# Patient Record
Sex: Male | Born: 1960 | Race: White | Hispanic: No | Marital: Single | Smoking: Never smoker
Health system: Southern US, Community
[De-identification: ages and names within clinical notes are randomized; demographics above are authoritative.]

## PROBLEM LIST (undated history)

## (undated) DIAGNOSIS — F1290 Cannabis use, unspecified, uncomplicated: Secondary | ICD-10-CM

## (undated) DIAGNOSIS — I10 Essential (primary) hypertension: Secondary | ICD-10-CM

## (undated) DIAGNOSIS — Z7289 Other problems related to lifestyle: Secondary | ICD-10-CM

## (undated) DIAGNOSIS — Z8719 Personal history of other diseases of the digestive system: Secondary | ICD-10-CM

## (undated) HISTORY — DX: Essential (primary) hypertension: I10

## (undated) HISTORY — PX: JOINT REPLACEMENT: SHX530

## (undated) HISTORY — PX: OTHER SURGICAL HISTORY: SHX169

---

## 2013-03-27 ENCOUNTER — Ambulatory Visit: Payer: Self-pay | Admitting: Internal Medicine

## 2014-09-28 IMAGING — CR DG SHOULDER 3+V*R*
1 series · 3 of 3 positions shown · non-contrast
Comparison: none

REASON FOR EXAM: prior fall
COMMENTS:

[Series 1: internal rotate · 0.17mm/px · 3 of 3 slices shown]
[im 1/3]
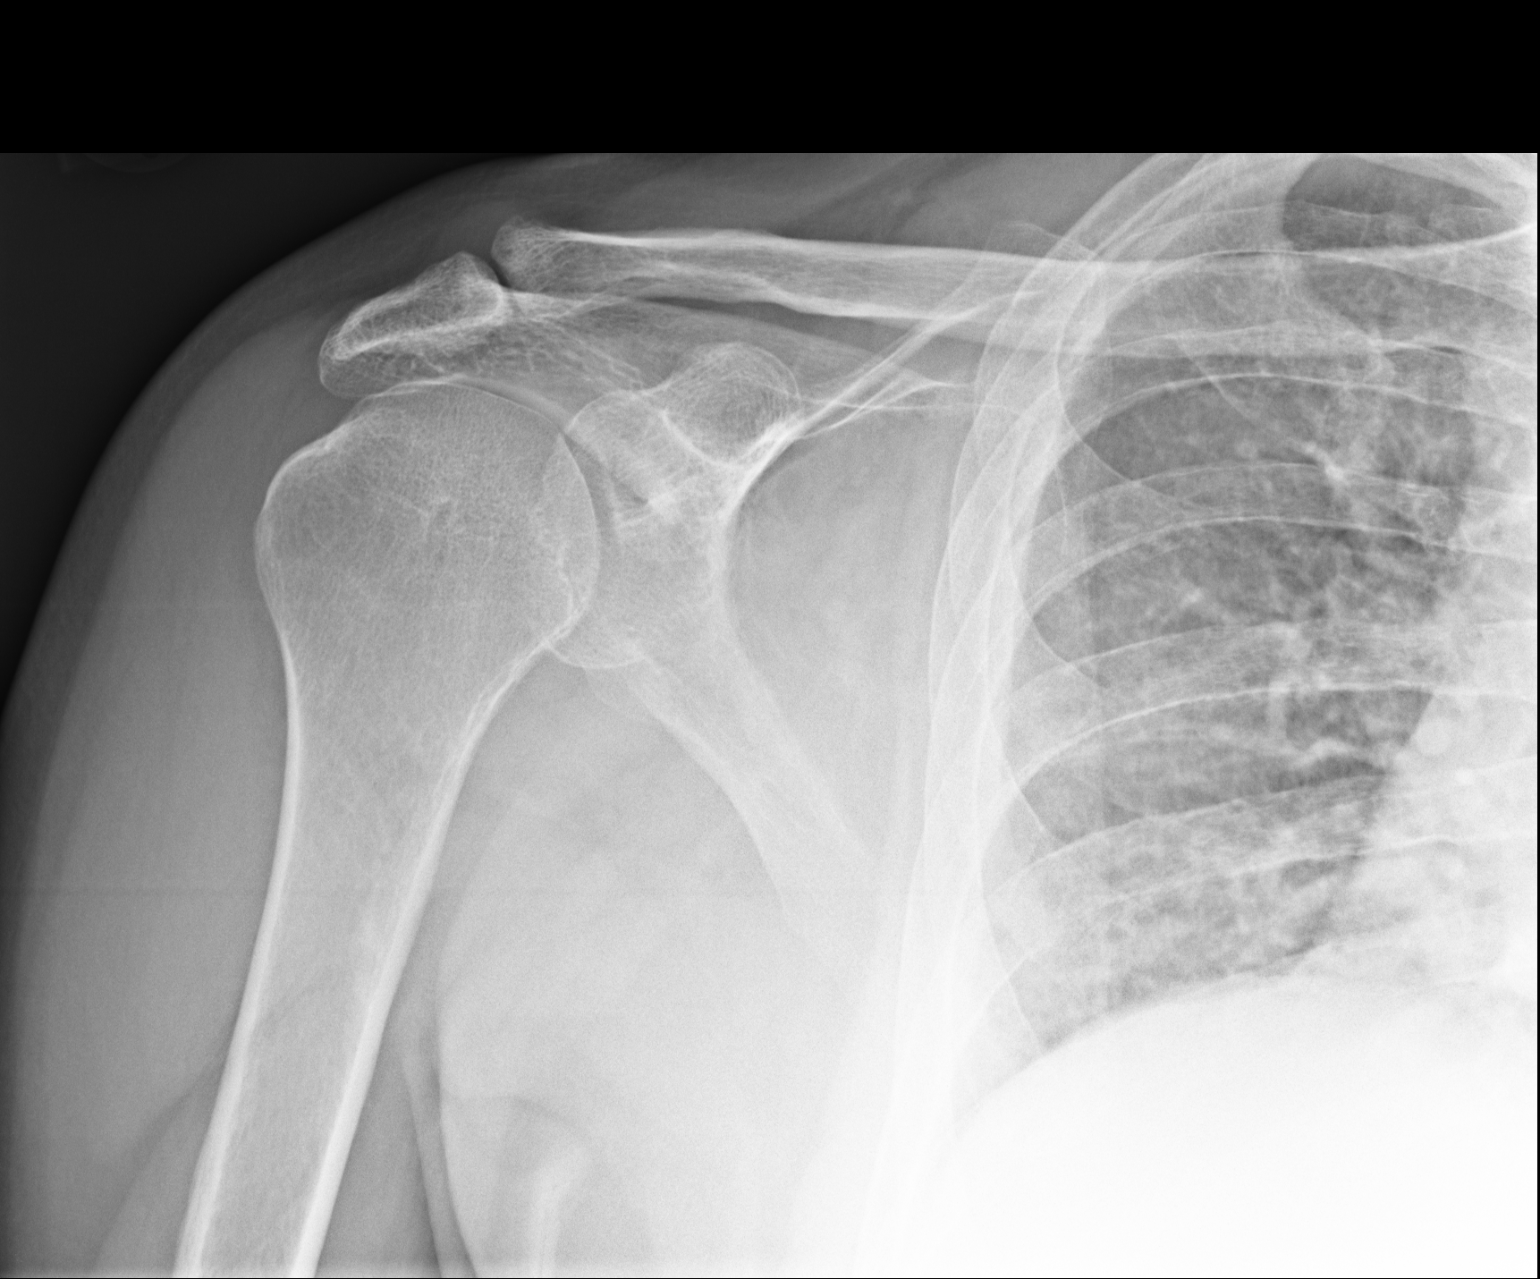
[im 2/3]
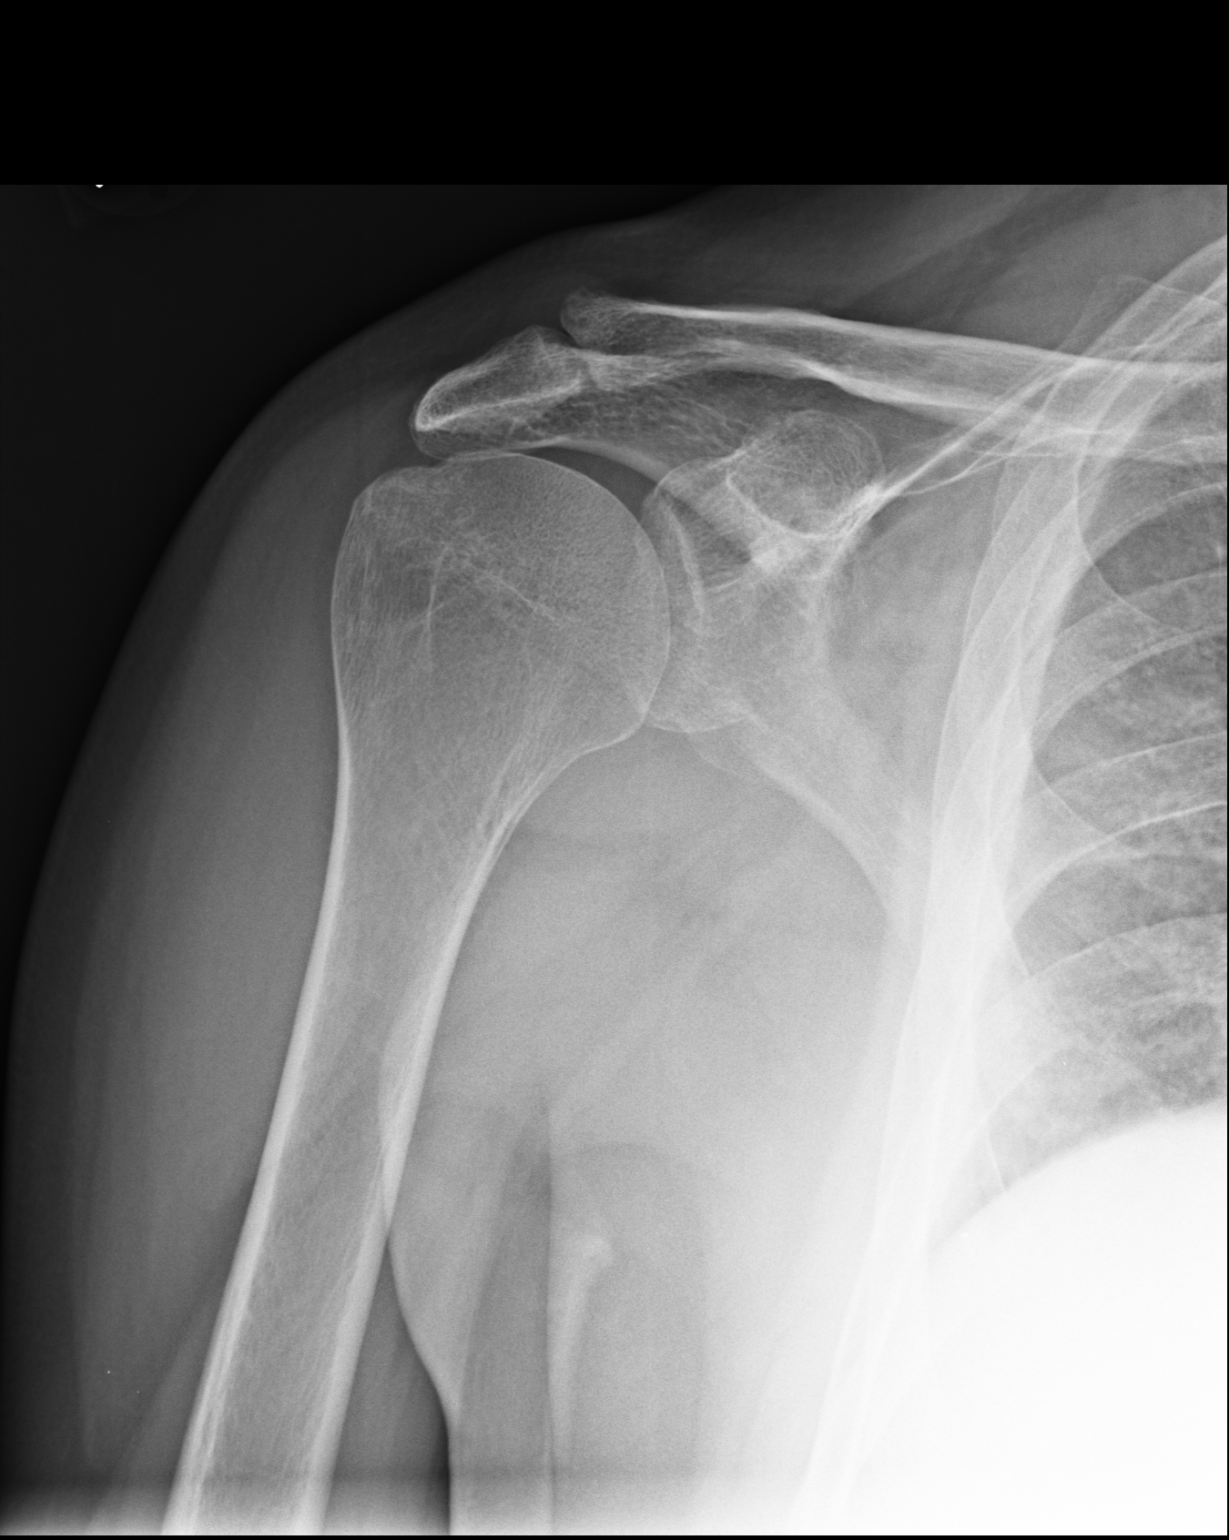
[im 3/3]
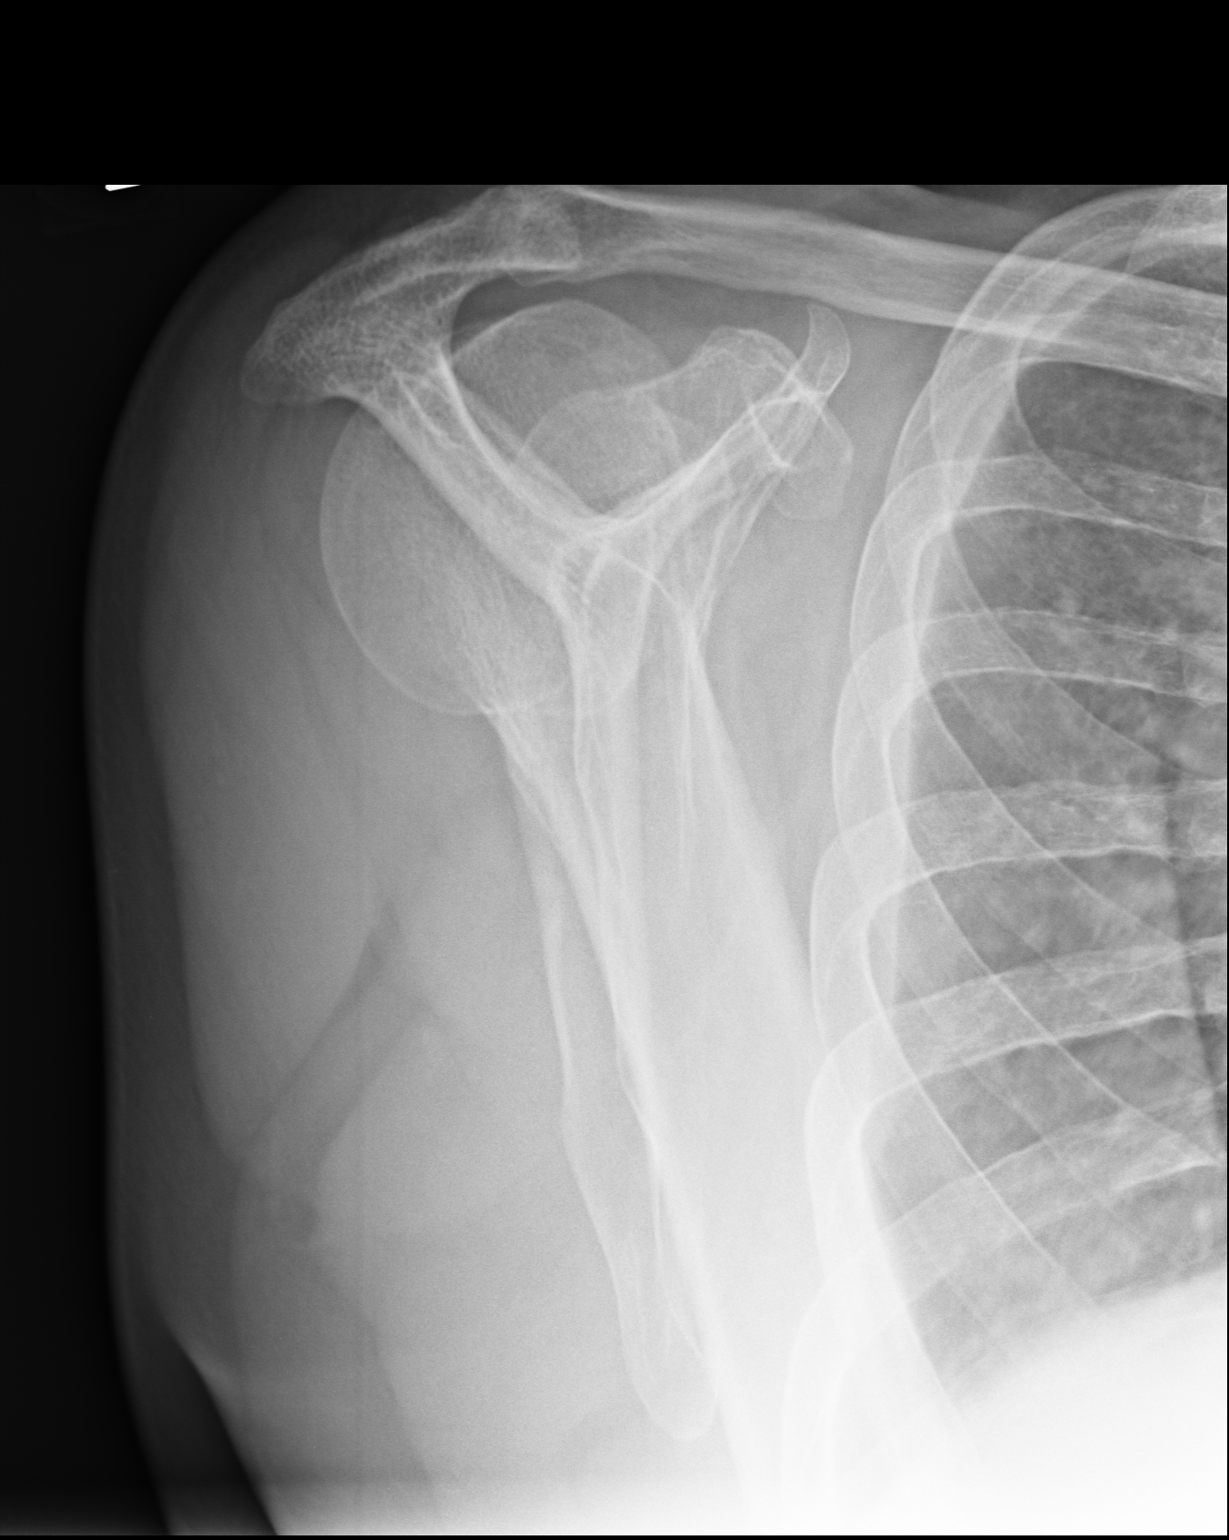

[3 of 3 positions shown; findings below may reference images not displayed]

PROCEDURE:     KSY - KSY SHOULDER RIGHT COMPLETE  - March 27, 2013  [DATE]

RESULT:     Three views of the right shoulder reveal the bones to be
adequately mineralized. There is no evidence of an acute fracture. The
overlying soft tissues are normal in appearance. Mild degenerative change of
the AC joint is present.
IMPRESSION: There is no acute bony abnormality of the right shoulder.

[REDACTED]

## 2017-11-19 ENCOUNTER — Ambulatory Visit: Payer: Self-pay | Admitting: Unknown Physician Specialty

## 2022-10-06 ENCOUNTER — Other Ambulatory Visit: Payer: Self-pay

## 2022-10-06 ENCOUNTER — Emergency Department: Payer: Worker's Compensation

## 2022-10-06 ENCOUNTER — Inpatient Hospital Stay
Admission: EM | Admit: 2022-10-06 | Discharge: 2022-10-10 | DRG: 522 | Disposition: A | Discharge status: Home / Self Care | Payer: Worker's Compensation | Attending: Internal Medicine | Admitting: Internal Medicine

## 2022-10-06 ENCOUNTER — Encounter: Payer: Self-pay | Admitting: Emergency Medicine

## 2022-10-06 DIAGNOSIS — M899 Disorder of bone, unspecified: Secondary | ICD-10-CM | POA: Diagnosis present

## 2022-10-06 DIAGNOSIS — W010XXA Fall on same level from slipping, tripping and stumbling without subsequent striking against object, initial encounter: Secondary | ICD-10-CM | POA: Diagnosis present

## 2022-10-06 DIAGNOSIS — I493 Ventricular premature depolarization: Secondary | ICD-10-CM | POA: Diagnosis present

## 2022-10-06 DIAGNOSIS — S72002A Fracture of unspecified part of neck of left femur, initial encounter for closed fracture: Principal | ICD-10-CM | POA: Diagnosis present

## 2022-10-06 DIAGNOSIS — W19XXXA Unspecified fall, initial encounter: Secondary | ICD-10-CM | POA: Diagnosis present

## 2022-10-06 DIAGNOSIS — F1290 Cannabis use, unspecified, uncomplicated: Secondary | ICD-10-CM | POA: Diagnosis present

## 2022-10-06 DIAGNOSIS — D649 Anemia, unspecified: Secondary | ICD-10-CM | POA: Diagnosis present

## 2022-10-06 DIAGNOSIS — R339 Retention of urine, unspecified: Secondary | ICD-10-CM | POA: Diagnosis not present

## 2022-10-06 DIAGNOSIS — R338 Other retention of urine: Secondary | ICD-10-CM | POA: Diagnosis not present

## 2022-10-06 DIAGNOSIS — R42 Dizziness and giddiness: Secondary | ICD-10-CM | POA: Diagnosis not present

## 2022-10-06 DIAGNOSIS — F17290 Nicotine dependence, other tobacco product, uncomplicated: Secondary | ICD-10-CM | POA: Diagnosis present

## 2022-10-06 DIAGNOSIS — R9431 Abnormal electrocardiogram [ECG] [EKG]: Secondary | ICD-10-CM | POA: Diagnosis present

## 2022-10-06 DIAGNOSIS — E785 Hyperlipidemia, unspecified: Secondary | ICD-10-CM | POA: Diagnosis present

## 2022-10-06 DIAGNOSIS — R030 Elevated blood-pressure reading, without diagnosis of hypertension: Secondary | ICD-10-CM | POA: Diagnosis present

## 2022-10-06 DIAGNOSIS — I959 Hypotension, unspecified: Secondary | ICD-10-CM | POA: Diagnosis not present

## 2022-10-06 DIAGNOSIS — Z5320 Procedure and treatment not carried out because of patient's decision for unspecified reasons: Secondary | ICD-10-CM | POA: Diagnosis present

## 2022-10-06 DIAGNOSIS — Y92009 Unspecified place in unspecified non-institutional (private) residence as the place of occurrence of the external cause: Secondary | ICD-10-CM

## 2022-10-06 DIAGNOSIS — D72829 Elevated white blood cell count, unspecified: Secondary | ICD-10-CM | POA: Diagnosis present

## 2022-10-06 DIAGNOSIS — Y990 Civilian activity done for income or pay: Secondary | ICD-10-CM

## 2022-10-06 HISTORY — DX: Cannabis use, unspecified, uncomplicated: F12.90

## 2022-10-06 HISTORY — DX: Personal history of other diseases of the digestive system: Z87.19

## 2022-10-06 HISTORY — DX: Other problems related to lifestyle: Z72.89

## 2022-10-06 LAB — COMPREHENSIVE METABOLIC PANEL
ALT: 20 U/L (ref 0–44)
AST: 22 U/L (ref 15–41)
Albumin: 3.6 g/dL (ref 3.5–5.0)
Alkaline Phosphatase: 80 U/L (ref 38–126)
Anion gap: 8 (ref 5–15)
BUN: 23 mg/dL (ref 8–23)
CO2: 23 mmol/L (ref 22–32)
Calcium: 8.3 mg/dL — ABNORMAL LOW (ref 8.9–10.3)
Chloride: 103 mmol/L (ref 98–111)
Creatinine, Ser: 0.97 mg/dL (ref 0.61–1.24)
GFR, Estimated: >60 mL/min (ref >60)
Glucose, Bld: 148 mg/dL — ABNORMAL HIGH (ref 70–99)
Potassium: 3.6 mmol/L (ref 3.5–5.1)
Sodium: 134 mmol/L — ABNORMAL LOW (ref 135–145)
Total Bilirubin: 0.5 mg/dL (ref 0.3–1.2)
Total Protein: 6.9 g/dL (ref 6.5–8.1)

## 2022-10-06 LAB — CBC WITH DIFFERENTIAL/PLATELET
Abs Immature Granulocytes: 0.09 10*3/uL — ABNORMAL HIGH (ref 0.00–0.07)
Basophils Absolute: 0.1 10*3/uL (ref 0.0–0.1)
Basophils Relative: 1 %
Eosinophils Absolute: 0.2 10*3/uL (ref 0.0–0.5)
Eosinophils Relative: 2 %
HCT: 36 % — ABNORMAL LOW (ref 39.0–52.0)
Hemoglobin: 11.5 g/dL — ABNORMAL LOW (ref 13.0–17.0)
Immature Granulocytes: 1 %
Lymphocytes Relative: 16 %
Lymphs Abs: 1.8 10*3/uL (ref 0.7–4.0)
MCH: 28.3 pg (ref 26.0–34.0)
MCHC: 31.9 g/dL (ref 30.0–36.0)
MCV: 88.7 fL (ref 80.0–100.0)
Monocytes Absolute: 0.8 10*3/uL (ref 0.1–1.0)
Monocytes Relative: 7 %
Neutro Abs: 8.1 10*3/uL — ABNORMAL HIGH (ref 1.7–7.7)
Neutrophils Relative %: 73 %
Platelets: 348 10*3/uL (ref 150–400)
RBC: 4.06 MIL/uL — ABNORMAL LOW (ref 4.22–5.81)
RDW: 13.3 % (ref 11.5–15.5)
WBC: 10.9 10*3/uL — ABNORMAL HIGH (ref 4.0–10.5)
nRBC: 0 % (ref 0.0–0.2)

## 2022-10-06 LAB — LIPID PANEL
Cholesterol: 223 mg/dL — ABNORMAL HIGH (ref 0–200)
HDL: 43 mg/dL (ref >40)
LDL Cholesterol: 152 mg/dL — ABNORMAL HIGH (ref 0–99)
Total CHOL/HDL Ratio: 5.2 RATIO
Triglycerides: 140 mg/dL (ref <150)
VLDL: 28 mg/dL (ref 0–40)

## 2022-10-06 LAB — PROTIME-INR
INR: 1.1 (ref 0.8–1.2)
Prothrombin Time: 14 seconds (ref 11.4–15.2)

## 2022-10-06 LAB — HIV ANTIBODY (ROUTINE TESTING W REFLEX): HIV Screen 4th Generation wRfx: NONREACTIVE

## 2022-10-06 LAB — TYPE AND SCREEN
ABO/RH(D): O NEG
Antibody Screen: NEGATIVE

## 2022-10-06 LAB — TROPONIN I (HIGH SENSITIVITY)
Troponin I (High Sensitivity): 10 ng/L (ref <18)
Troponin I (High Sensitivity): 11 ng/L (ref <18)

## 2022-10-06 LAB — APTT: aPTT: 28 seconds (ref 24–36)

## 2022-10-06 LAB — HM HIV SCREENING LAB: HM HIV Screening: NEGATIVE

## 2022-10-06 MED ORDER — MORPHINE SULFATE (PF) 4 MG/ML IV SOLN
4.0000 mg | INTRAVENOUS | Status: DC | PRN
  Administered 2022-10-06: 4 mg via INTRAVENOUS
  Filled 2022-10-06: qty 1

## 2022-10-06 MED ORDER — HYDROMORPHONE HCL 1 MG/ML IJ SOLN
0.5000 mg | INTRAMUSCULAR | Status: DC | PRN
  Administered 2022-10-06 – 2022-10-07 (×7): 0.5 mg via INTRAVENOUS
  Filled 2022-10-06: qty 1
  Filled 2022-10-06 (×2): qty 0.5
  Filled 2022-10-06 (×4): qty 1

## 2022-10-06 MED ORDER — ONDANSETRON HCL 4 MG/2ML IJ SOLN: 4.0000 mg | Freq: Three times a day (TID) | INTRAMUSCULAR | Status: DC | PRN

## 2022-10-06 MED ORDER — SENNOSIDES-DOCUSATE SODIUM 8.6-50 MG PO TABS: 1.0000 | ORAL_TABLET | Freq: Every evening | ORAL | Status: DC | PRN

## 2022-10-06 MED ORDER — ONDANSETRON HCL 4 MG/2ML IJ SOLN
4.0000 mg | Freq: Once | INTRAMUSCULAR | Status: AC
  Administered 2022-10-06: 4 mg via INTRAVENOUS
  Filled 2022-10-06: qty 2

## 2022-10-06 MED ORDER — ADULT MULTIVITAMIN W/MINERALS CH
1.0000 | ORAL_TABLET | Freq: Every day | ORAL | Status: DC
  Administered 2022-10-07 – 2022-10-10 (×4): 1 via ORAL
  Filled 2022-10-06 (×4): qty 1

## 2022-10-06 MED ORDER — METHOCARBAMOL 500 MG PO TABS
500.0000 mg | ORAL_TABLET | Freq: Three times a day (TID) | ORAL | Status: DC | PRN
  Administered 2022-10-07: 500 mg via ORAL
  Filled 2022-10-06: qty 1

## 2022-10-06 MED ORDER — ACETAMINOPHEN 325 MG PO TABS: 650.0000 mg | ORAL_TABLET | Freq: Four times a day (QID) | ORAL | Status: DC | PRN

## 2022-10-06 MED ORDER — LIDOCAINE 5 % EX PTCH
1.0000 | MEDICATED_PATCH | CUTANEOUS | Status: DC
  Administered 2022-10-06 – 2022-10-09 (×4): 1 via TRANSDERMAL
  Filled 2022-10-06 (×4): qty 1

## 2022-10-06 MED ORDER — ALBUTEROL SULFATE (2.5 MG/3ML) 0.083% IN NEBU: 2.5000 mg | INHALATION_SOLUTION | RESPIRATORY_TRACT | Status: DC | PRN

## 2022-10-06 MED ORDER — OXYCODONE-ACETAMINOPHEN 5-325 MG PO TABS
1.0000 | ORAL_TABLET | ORAL | Status: DC | PRN
  Administered 2022-10-06 (×2): 1 via ORAL
  Filled 2022-10-06 (×2): qty 1

## 2022-10-06 MED ORDER — HYDRALAZINE HCL 20 MG/ML IJ SOLN
5.0000 mg | INTRAMUSCULAR | Status: DC | PRN
  Administered 2022-10-06: 5 mg via INTRAVENOUS
  Filled 2022-10-06: qty 1

## 2022-10-06 MED ORDER — MORPHINE SULFATE (PF) 2 MG/ML IV SOLN: 2.0000 mg | INTRAVENOUS | Status: DC | PRN

## 2022-10-06 MED ORDER — ENSURE ENLIVE PO LIQD
237.0000 mL | Freq: Two times a day (BID) | ORAL | Status: DC
  Administered 2022-10-08 – 2022-10-09 (×3): 237 mL via ORAL

## 2022-10-06 MED ORDER — SODIUM CHLORIDE 0.9 % IV SOLN: INTRAVENOUS | Status: DC

## 2022-10-06 NOTE — Progress Notes (Signed)
       CROSS COVER NOTE  NAME: Christian Hughes MRN: [id number] DOB : [birth year]    HPI/Events of Note   Report: urinary retention with blader scan volume 1000 ml  On review of chart: Femoral neck fracture post mechanical fall with   Assessment and  Interventions   Assessment:  Plan: Bladder scan every 6 h  In and out cath for bladder volumes greater than 400

## 2022-10-06 NOTE — ED Provider Notes (Signed)
[facility] Provider Note    Event Date/Time   First MD Initiated Contact with Patient 10/06/22 1230     (approximate)   History   Fall and Hip Pain   HPI  Christian Hughes is a 62 y.o. male no significant past medical history noted the patient presents to the ER after mechanical fall while he was at work at for today.  Tripped on slick flooring falling landing on his left hip.  Unable to bear weight after the fall.  Did not hit his head.  Denies any LOC.  Reports pain is moderate to severe in the left hip.  No previous surgeries not on any blood thinners.     Physical Exam   Triage Vital Signs: ED Triage Vitals  Enc Vitals Group     BP 10/06/22 1244 (!) 170/102     Pulse Rate 10/06/22 1244 (!) 57     Resp 10/06/22 1244 18     Temp 10/06/22 1244 97.8 F (36.6 C)     Temp Source 10/06/22 1244 Oral     SpO2 10/06/22 1244 94 %     Weight 10/06/22 1243 220 lb (99.8 kg)     Height 10/06/22 1243 6' (1.829 m)     Head Circumference --      Peak Flow --      Pain Score 10/06/22 1238 9     Pain Loc --      Pain Edu? --      Excl. in [place]? --     Most recent vital signs: Vitals:   10/06/22 1244  BP: (!) 170/102  Pulse: (!) 57  Resp: 18  Temp: 97.8 F (36.6 C)  SpO2: 94%     Constitutional: Alert  Eyes: Conjunctivae are normal.  Head: Atraumatic. Nose: No congestion/rhinnorhea. Mouth/Throat: Mucous membranes are moist.   Neck: Painless ROM.  Cardiovascular:   Good peripheral circulation. Respiratory: Normal respiratory effort.  No retractions.  Gastrointestinal: Soft and nontender.  Musculoskeletal: Left lower extremity is shortened and externally rotated.  Thigh and distal compartments soft and compressible.  Neurovascular intact distally. Neurologic:  MAE spontaneously. No gross focal neurologic deficits are appreciated.  Skin:  Skin is warm, dry and intact. No rash noted. Psychiatric: Mood and affect are normal. Speech and behavior are  normal.    ED Results / Procedures / Treatments   Labs (all labs ordered are listed, but only abnormal results are displayed) Labs Reviewed  CBC WITH DIFFERENTIAL/PLATELET - Abnormal; Notable for the following components:      Result Value   WBC 10.9 (*)    RBC 4.06 (*)    Hemoglobin 11.5 (*)    HCT 36.0 (*)    Neutro Abs 8.1 (*)    Abs Immature Granulocytes 0.09 (*)    All other components within normal limits  COMPREHENSIVE METABOLIC PANEL - Abnormal; Notable for the following components:   Sodium 134 (*)    Glucose, Bld 148 (*)    Calcium 8.3 (*)    All other components within normal limits  TROPONIN I (HIGH SENSITIVITY)     EKG  ED ECG REPORT I, Merlyn Lot, the attending physician, personally viewed and interpreted this ECG.   Date: 10/06/2022  EKG Time: 12:51  Rate: 60  Rhythm: sinus  Axis: normal  Intervals: normal  ST&T Change: no stemi, non specific t wave abn anterolaterally, no prior comparison    RADIOLOGY Please see ED Course for my review and  interpretation.  I personally reviewed all radiographic images ordered to evaluate for the above acute complaints and reviewed radiology reports and findings.  These findings were personally discussed with the patient.  Please see medical record for radiology report.    PROCEDURES:  Critical Care performed: No  Procedures   MEDICATIONS ORDERED IN ED: Medications  morphine (PF) 4 MG/ML injection 4 mg (4 mg Intravenous Given 10/06/22 1300)  HYDROmorphone (DILAUDID) injection 0.5 mg (0.5 mg Intravenous Given 10/06/22 1334)  ondansetron (ZOFRAN) injection 4 mg (4 mg Intravenous Given 10/06/22 1259)     IMPRESSION / MDM / ASSESSMENT AND PLAN / ED COURSE  I reviewed the triage vital signs and the nursing notes.                              Differential diagnosis includes, but is not limited to, fracture, contusion, dislocation  Patient presenting to the ER for evaluation of symptoms as described  above.  Based on symptoms, risk factors and considered above differential, this presenting complaint could reflect a potentially life-threatening illness therefore the patient will be placed on continuous pulse oximetry and telemetry for monitoring.  Laboratory evaluation will be sent to evaluate for the above complaints.  The morphine ordered for pain.  EKG is abnormal with no prior comparisons but denies any chest pain and this was not a syncopal event will add on troponin.  He is mildly hypertensive likely secondary to pain.  X-rays ordered for the above differential.    Clinical Course as of 10/06/22 1426  Fri Oct 06, 2022  1407 XR of left hip on my review and interpretation with evidence of femoral neck fracture will consult Ortho. [PR]  1412 Discussed case in consultation with Dr. Mack Guise orthopedics agrees with plan for admission to hospitalist service. [PR]    Clinical Course User Index [PR] Merlyn Lot, MD      FINAL CLINICAL IMPRESSION(S) / ED DIAGNOSES   Final diagnoses:  Closed left hip fracture, initial encounter [facility])     Rx / DC Orders   ED Discharge Orders     None        Note:  This document was prepared using Dragon voice recognition software and may include unintentional dictation errors.    Merlyn Lot, MD 10/06/22 1426

## 2022-10-06 NOTE — ED Triage Notes (Signed)
Presents via EMS s/p slip and fall  Landed on left hip  Deformity noted to left hip

## 2022-10-06 NOTE — Progress Notes (Addendum)
Initial Nutrition Assessment  DOCUMENTATION CODES:   Not applicable  INTERVENTION:   -Once diet is advanced, add:   -Ensure Enlive po BID, each supplement provides 350 kcal and 20 grams of protein -MVI with minerals daily  NUTRITION DIAGNOSIS:   Increased nutrient needs related to post-op healing as evidenced by estimated needs.  GOAL:   Patient will meet greater than or equal to 90% of their needs  MONITOR:   PO intake, Supplement acceptance, Diet advancement  REASON FOR ASSESSMENT:   Consult Hip fracture protocol, Assessment of nutrition requirement/status  ASSESSMENT:   Pt with no significant past medical history noted the patient presents after mechanical fall  Pt admitted with lt hip fracture s/p mechanical fall.   Pt unavailable at time of visit. Attempted to speak with pt via call to hospital room phone, however, unable to reach. RD unable to obtain further nutrition-related history or complete nutrition-focused physical exam at this time.    Pt awaiting orthopedics consult. NPO for potential surgery.   No wt history available to assess acute weight changes.   Pt with increased nutritional needs for post-op healing. Pt would greatly benefit from addition of oral nutrition supplements.   Medications reviewed and include 0.9% sodium chloride infusion @ 75 ml/hr.   Labs reviewed: Na: 134.  Diet Order:   Diet Order             Diet NPO time specified Except for: Sips with Meds, Ice Chips  Diet effective now                   EDUCATION NEEDS:   Not appropriate for education at this time  Skin:     Last BM:  Unknown  Height:   Ht Readings from Last 1 Encounters:  10/06/22 6' (1.829 m)    Weight:   Wt Readings from Last 1 Encounters:  10/06/22 99.8 kg    Ideal Body Weight:  80.9 kg  BMI:  Body mass index is 29.84 kg/m.  Estimated Nutritional Needs:   Kcal:  2000-2200  Protein:  105-120 grams  Fluid:  > 2 L    Loistine Chance,  RD, LDN, [place] Registered Dietitian II Certified Diabetes Care and Education Specialist Please refer to [facility] for RD and/or RD on-call/weekend/after hours pager

## 2022-10-06 NOTE — Consult Note (Signed)
ORTHOPAEDIC CONSULTATION  REQUESTING PHYSICIAN: Ivor Costa, MD  Chief Complaint: Left hip pain status post fall  HPI: Christian Hughes is a 62 y.o. male who scented to the [facility] regional emergency department today with left hip pain after a mechanical fall.  Patient works for [employer] and slipped on a wet floor falling onto his left hip.  X-rays in the ER reveal a displaced left femoral neck hip fracture.  Orthopedics is consulted for management of his fracture.  Patient has been admitted to the hospital service.  Past Medical History:  Diagnosis Date   Engages in vaping    H/O ventral hernia    S/p of repair   Marijuana use    Past Surgical History:  Procedure Laterality Date   Ventral hernia repair     Social History   Socioeconomic History   Marital status: Single    Spouse name: Not on file   Number of children: Not on file   Years of education: Not on file   Highest education level: Not on file  Occupational History   Not on file  Tobacco Use   Smoking status: Never   Smokeless tobacco: Never   Tobacco comments:    Occasional vaping  Substance and Sexual Activity   Alcohol use: Not Currently   Drug use: Yes    Types: Marijuana   Sexual activity: Yes  Other Topics Concern   Not on file  Social History Narrative   Not on file   Social Determinants of Health   Financial Resource Strain: Not on file  Food Insecurity: Not on file  Transportation Needs: Not on file  Physical Activity: Not on file  Stress: Not on file  Social Connections: Not on file   History reviewed. No pertinent family history. No Known Allergies Prior to Admission medications   Not on File   DG Hip Unilat W or Wo Pelvis 2-3 Views Left  Result Date: 10/06/2022 CLINICAL DATA:  Left hip pain after fall. EXAM: DG HIP (WITH OR WITHOUT PELVIS) 2-3V LEFT COMPARISON:  None Available. FINDINGS: Acute fracture of the left femoral neck with angulation and foreshortening. Two round sclerotic  lesions in the right iliac bone measuring up 1.5 cm. Lower lumbar spine degenerative changes. Soft tissues are unremarkable. IMPRESSION: 1. Acute angulated left femoral neck fracture. 2. Two round sclerotic lesions in the right iliac bone are nonspecific. While these could represent bone islands, metastatic disease is not excluded. Correlate for history of malignancy. Electronically Signed   By: Titus Dubin M.D.   On: 10/06/2022 14:12   DG Chest Portable 1 View  Result Date: 10/06/2022 CLINICAL DATA:  Left hip fracture following a fall. EXAM: PORTABLE CHEST 1 VIEW COMPARISON:  None Available. FINDINGS: Enlarged cardiac silhouette. Tortuous aorta with deviation of the trachea to the right. Clear lungs with normal vascularity. Minimally prominent interstitial markings. Tiny calcified granuloma or bone island overlying each anterior 2nd rib. No fracture or pneumothorax seen. IMPRESSION: 1. No acute abnormality. 2. Cardiomegaly and minimal chronic interstitial lung disease. Electronically Signed   By: Claudie Revering M.D.   On: 10/06/2022 14:11    Positive ROS: All other systems have been reviewed and were otherwise negative with the exception of those mentioned in the HPI and as above.  Physical Exam: General: Alert, no acute distress  MUSCULOSKELETAL: Left hip: Patient has shortening and external rotation of left lower extremity.  Skin is intact overlying the left hip without erythema ecchymosis or significant swelling.  His thigh and leg compartments are soft and compressible.  He has palpable pedal pulses, intact sensation light touch and intact motor function distally.  Assessment: Left femoral neck hip fracture  Plan: I personally reviewed the patient's x-rays.  He has a displaced left femoral neck hip fracture and I have recommended a left hip hemiarthroplasty for treatment.  Reviewed the details of the operation as well as the postoperative course with the patient and his son who was at the  bedside..  I discussed the risks and benefits of surgery. The risks include but are not limited to infection, bleeding requiring blood transfusion, nerve or blood vessel injury, joint stiffness or loss of motion, persistent pain, weakness or instability, fracture, dislocation or hardware failure and the need for further surgery. Patient understood these risks and wished to proceed.   Reviewed the patient's labs and radiographs in preparation for this case.  Patient is been cleared by Dr. Rockey Situ from cardiology for surgery.  The patient will be n.p.o. after midnight.  Patient should not receive anticoagulation in preparation for surgery tomorrow.    Thornton Park, MD    10/06/2022 5:35 PM

## 2022-10-06 NOTE — H&P (Addendum)
History and Physical    Christian Hughes [id number] DOB: September 16, 1960 DOA: 10/06/2022  Referring MD/NP/PA:   PCP: Albina Billet, MD   Patient coming from:  The patient is coming from home.          Chief Complaint: fall and left hip pain  HPI: Christian Hughes is a 62 y.o. male without significant past medical history except for occasional vaping amd marijuana use, who presents with fall and left hip pain.    Pt states that he slipped and fell at work at about 11:00 AM.  Denies loss of consciousness.  He injured his left hip, causing left hip pain, which is constant, sharp, severe, nonradiating, aggravated by movement.  No leg numbness.  Patient denies chest pain, cough, shortness breath.  No nausea vomiting, diarrhea or abdominal pain.  No symptoms of UTI.  Patient strongly denies any head or neck injury.  He refused CT scan of head and neck.  Data reviewed independently and ED Course: pt was found to have WBC 10.9, hemoglobin 11.5, GFR> 60, temperature normal, blood pressure 170/102, heart rate 57, RR 18, oxygen saturation 94% on room air.  Chest x-ray showed cardiomegaly and minimal interstitial disease.  X-ray of left hip showed left femoral neck fracture with some sclerotic bone lesion.  Patient is admitted to telemetry bed as inpatient.  Dr. Rockey Situ of card and Dr. Mack Guise of Ortho are consulted.   X-ray of left hip 1. Acute angulated left femoral neck fracture. 2. Two round sclerotic lesions in the right iliac bone are nonspecific. While these could represent bone islands, metastatic disease is not excluded. Correlate for history of malignancy.   EKG: I have personally reviewed.  Sinus rhythm, QTc 449, T wave inversion in lateral leads, V4-V6, ST elevation in V1-V2   Review of Systems:   General: no fevers, chills, no body weight gain, fatigue HEENT: no blurry vision, hearing changes or sore throat Respiratory: no dyspnea, coughing, wheezing CV: no chest pain, no  palpitations GI: no nausea, vomiting, abdominal pain, diarrhea, constipation GU: no dysuria, burning on urination, increased urinary frequency, hematuria  Ext: no leg edema Neuro: no unilateral weakness, numbness, or tingling, no vision change or hearing loss. Has fall Skin: no rash, no skin tear. MSK: has left hip pain Heme: No easy bruising.  Travel history: No recent long distant travel.   Allergy: No Known Allergies  Past Medical History:  Diagnosis Date   Engages in vaping    H/O ventral hernia    S/p of repair   Marijuana use     Past Surgical History:  Procedure Laterality Date   Ventral hernia repair      Social History:  reports that he has never smoked. He has never used smokeless tobacco. He reports that he does not currently use alcohol. He reports current drug use. Drug: Marijuana.  Family History: I have reviewed with patient about his family medical history, but patient states that there is no significant family medical history to report  Prior to Admission medications   Not on File    Physical Exam: Vitals:   10/06/22 1243 10/06/22 1244 10/06/22 1614  BP:  (!) 170/102 (!) 189/117  Pulse:  (!) 57 86  Resp:  18 18  Temp:  97.8 F (36.6 C)   TempSrc:  Oral   SpO2:  94% 94%  Weight: 99.8 kg    Height: 6' (1.829 m)     General: Not in acute distress HEENT:  Eyes: PERRL, EOMI, no scleral icterus.       ENT: No discharge from the ears and nose, no pharynx injection, no tonsillar enlargement.        Neck: No JVD, no bruit, no mass felt. Heme: No neck lymph node enlargement. Cardiac: S1/S2, RRR, No murmurs, No gallops or rubs. Respiratory: No rales, wheezing, rhonchi or rubs. GI: Soft, nondistended, nontender, no rebound pain, no organomegaly, BS present. GU: No hematuria Ext: No pitting leg edema bilaterally. 1+DP/PT pulse bilaterally. Musculoskeletal: has left hip tenderenss Skin: No rashes.  Neuro: Alert, oriented X3, cranial nerves II-XII  grossly intact, moves all extremities normally.  Psych: Patient is not psychotic, no suicidal or hemocidal ideation.  Labs on Admission: I have personally reviewed following labs and imaging studies  CBC: Recent Labs  Lab 10/06/22 1254  WBC 10.9*  NEUTROABS 8.1*  HGB 11.5*  HCT 36.0*  MCV 88.7  PLT 0000000   Basic Metabolic Panel: Recent Labs  Lab 10/06/22 1254  NA 134*  K 3.6  CL 103  CO2 23  GLUCOSE 148*  BUN 23  CREATININE 0.97  CALCIUM 8.3*   GFR: Estimated Creatinine Clearance: 95.4 mL/min (by C-G formula based on SCr of 0.97 mg/dL). Liver Function Tests: Recent Labs  Lab 10/06/22 1254  AST 22  ALT 20  ALKPHOS 80  BILITOT 0.5  PROT 6.9  ALBUMIN 3.6   No results for input(s): "LIPASE", "AMYLASE" in the last 168 hours. No results for input(s): "AMMONIA" in the last 168 hours. Coagulation Profile: Recent Labs  Lab 10/06/22 1511  INR 1.1   Cardiac Enzymes: No results for input(s): "CKTOTAL", "CKMB", "CKMBINDEX", "TROPONINI" in the last 168 hours. BNP (last 3 results) No results for input(s): "PROBNP" in the last 8760 hours. HbA1C: No results for input(s): "HGBA1C" in the last 72 hours. CBG: No results for input(s): "GLUCAP" in the last 168 hours. Lipid Profile: Recent Labs    10/06/22 1511  CHOL 223*  HDL 43  LDLCALC 152*  TRIG 140  CHOLHDL 5.2   Thyroid Function Tests: No results for input(s): "TSH", "T4TOTAL", "FREET4", "T3FREE", "THYROIDAB" in the last 72 hours. Anemia Panel: No results for input(s): "VITAMINB12", "FOLATE", "FERRITIN", "TIBC", "IRON", "RETICCTPCT" in the last 72 hours. Urine analysis: No results found for: "COLORURINE", "APPEARANCEUR", "LABSPEC", "PHURINE", "GLUCOSEU", "HGBUR", "BILIRUBINUR", "KETONESUR", "PROTEINUR", "UROBILINOGEN", "NITRITE", "LEUKOCYTESUR" Sepsis Labs: '@LABRCNTIP'$ (procalcitonin:4,lacticidven:4) )No results found for this or any previous visit (from the past 240 hour(s)).   Radiological Exams on  Admission: DG Hip Unilat W or Wo Pelvis 2-3 Views Left  Result Date: 10/06/2022 CLINICAL DATA:  Left hip pain after fall. EXAM: DG HIP (WITH OR WITHOUT PELVIS) 2-3V LEFT COMPARISON:  None Available. FINDINGS: Acute fracture of the left femoral neck with angulation and foreshortening. Two round sclerotic lesions in the right iliac bone measuring up 1.5 cm. Lower lumbar spine degenerative changes. Soft tissues are unremarkable. IMPRESSION: 1. Acute angulated left femoral neck fracture. 2. Two round sclerotic lesions in the right iliac bone are nonspecific. While these could represent bone islands, metastatic disease is not excluded. Correlate for history of malignancy. Electronically Signed   By: Titus Dubin M.D.   On: 10/06/2022 14:12   DG Chest Portable 1 View  Result Date: 10/06/2022 CLINICAL DATA:  Left hip fracture following a fall. EXAM: PORTABLE CHEST 1 VIEW COMPARISON:  None Available. FINDINGS: Enlarged cardiac silhouette. Tortuous aorta with deviation of the trachea to the right. Clear lungs with normal vascularity. Minimally prominent interstitial markings. Tiny calcified  granuloma or bone island overlying each anterior 2nd rib. No fracture or pneumothorax seen. IMPRESSION: 1. No acute abnormality. 2. Cardiomegaly and minimal chronic interstitial lung disease. Electronically Signed   By: Claudie Revering M.D.   On: 10/06/2022 14:11      Assessment/Plan Principal Problem:   Fracture of femoral neck, left, closed (HCC) Active Problems:   Fall   Abnormal EKG   Normocytic anemia   Bone lesion_sclerotic lesions in the right iliac bone   Elevated blood pressure reading   Assessment and Plan:  Fracture of femoral neck, left, closed ([place]): As evidenced by x-ray. Patient has moderate pain now. No neurovascular compromise. Orthopedic surgeon, Dr. Mack Guise was consulted.   - will admit to Med-surg bed - Pain control: prn morphine, percocet and tyleno - When necessary Zofran for nausea -  Robaxin for muscle spasm - Lidoderm patch for pain - type and cross - INR/PTT - PT/OT when able to (not ordered now) -NPO after MN   Leukocytosis: mild. WBC 10.9 Likely due to stress-induced demargination. Patient does not have signs of infection. -Follow-up CBC  Fall at work: seems to be an accidental mechanical fall -PT/OT when able to  Abnormal EKG: Patient has a very abnormal EKG with T wave inversion in lateral leads and V4-V6, ST elevation in V1-V2.  Patient does not have any chest pain, cough, shortness breath.  Initial troponin 10 -Consulted Dr. Rockey Situ for cardiology for presurgical clearance -Check A1c, FLP, UDS -Trend troponin -2D echo was ordered by Dr. Rockey Situ  Normocytic anemia: Hemoglobin 11.5, no baseline hemoglobin available -Follow-up visit visit  Bone lesion_sclerotic lesions in the right iliac bone: This is an incidental findings by x-ray which showed two round sclerotic lesions in the right iliac bone, nonspecific. While these could represent bone islands, metastatic disease is not excluded.  -need to give referral to oncology for outpt follow up.  Elevated blood pressure reading: Bp 170/102. No hx of HTN, may be due to pain -IV hydralazine as needed    DVT ppx: SCD  Code Status: Full code  Family Communication: I offered to call his family, but patient states that his son was with him here.  His son knows what is going on for him.  Patient states that I do not need to call his family.  Disposition Plan:  Anticipate discharge back to previous environment  Consults called:  Dr. Rockey Situ of card and Dr. Mack Guise of Ortho are consulted.  Admission status and Level of care: Telemetry Medical:     as inpt      Dispo: The patient is from: Home              Anticipated d/c is to:  To be determined              Anticipated d/c date is: 2 days              Patient currently is not medically stable to d/c.    Severity of Illness:  The appropriate patient  status for this patient is INPATIENT. Inpatient status is judged to be reasonable and necessary in order to provide the required intensity of service to ensure the patient's safety. The patient's presenting symptoms, physical exam findings, and initial radiographic and laboratory data in the context of their chronic comorbidities is felt to place them at high risk for further clinical deterioration. Furthermore, it is not anticipated that the patient will be medically stable for discharge from the hospital within 2 midnights of  admission.   * I certify that at the point of admission it is my clinical judgment that the patient will require inpatient hospital care spanning beyond 2 midnights from the point of admission due to high intensity of service, high risk for further deterioration and high frequency of surveillance required.*       Date of Service 10/06/2022    Ivor Costa [facility]   If 7PM-7AM, please contact night-coverage [email or web address] 10/06/2022, 5:27 PM

## 2022-10-06 NOTE — Consult Note (Signed)
Cardiology Consultation   Patient ID: Christian Hughes MRN: [id number]; DOB: 03-Apr-1961  Admit date: 10/06/2022 Date of Consult: 10/06/2022  PCP:  Albina Billet, MD   [place] Providers Cardiologist:  None      New consult done by Dr Rockey Situ  Patient Profile:   Christian Hughes is a 62 y.o. male with no real significant past medical history except a hx of occasional vaping and marijuana use who is being seen 10/06/2022 for the evaluation of abnormal EKG requiring surgical clearance at the request of Dr Blaine Hamper.  History of Present Illness:   Mr. Woodrome is a 62 year old male with no significant past medical history.  He states occasionally he vapes and has occasional marijuana use.  Previous surgical history of ventral hernia repair.  He presented to the [facility] emergency department this afternoon via EMS status post a slip and mechanical fall at work.  He landed on his left hip and was unable to bear weight, there was noted deformity of the left hip on arrival to the emergency department.  He stated he slid on slick flooring and fell on his hip but did not hit his head and denies any loss of consciousness.  His only complaint is moderate to severe left hip pain.  Denies any chest pain, shortness of breath, nausea/vomiting, lightheadedness and dizziness, or peripheral edema.  Initial vital signs: Blood pressure 170/22, pulse 57, respirations of 18, temperature of 97.8  Pertinent labs: WBCs of 10.9, hemoglobin 11.5, hematocrit 36.0, sodium 134, blood glucose 148, calcium of 8.3, high-sensitivity troponin negative  Medications received in the emergency department: Morphine 4 mg IVP, Dilaudid 0.5 mg IVP, and Zofran 4 mg IVP  Imaging: Unilateral hip view revealed acute angulated left femoral neck fracture, and 2 round sclerotic lesions in the right iliac bone are nonspecific while these could represent bone islands, metastatic disease is not excluded, correlate for history of malignancy  EKG  was done which resulted as abnormal thus cardiology was consulted to provide guidance and offer cardiac clearance for surgery.  Past Medical History:  Diagnosis Date   Engages in vaping    H/O ventral hernia    S/p of repair   Marijuana use     Past Surgical History:  Procedure Laterality Date   Ventral hernia repair       Home Medications:  Prior to Admission medications   Not on File    Inpatient Medications: Scheduled Meds:  [START ON 10/07/2022] feeding supplement  237 mL Oral BID BM   lidocaine  1 patch Transdermal Q24H   [START ON 10/07/2022] multivitamin with minerals  1 tablet Oral Daily   Continuous Infusions:  sodium chloride     PRN Meds: acetaminophen, albuterol, hydrALAZINE, HYDROmorphone (DILAUDID) injection, methocarbamol, ondansetron (ZOFRAN) IV, oxyCODONE-acetaminophen, senna-docusate  Allergies:   No Known Allergies  Social History:   Social History   Socioeconomic History   Marital status: Single    Spouse name: Not on file   Number of children: Not on file   Years of education: Not on file   Highest education level: Not on file  Occupational History   Not on file  Tobacco Use   Smoking status: Never   Smokeless tobacco: Never   Tobacco comments:    Occasional vaping  Substance and Sexual Activity   Alcohol use: Not Currently   Drug use: Yes    Types: Marijuana   Sexual activity: Yes  Other Topics Concern   Not on file  Social History Narrative   Not on file   Social Determinants of Health   Financial Resource Strain: Not on file  Food Insecurity: Not on file  Transportation Needs: Not on file  Physical Activity: Not on file  Stress: Not on file  Social Connections: Not on file  Intimate Partner Violence: Not on file    Family History:   History reviewed. No pertinent family history.   ROS:  Please see the history of present illness.  Review of Systems  Musculoskeletal:  Positive for falls and joint pain.    All other ROS  reviewed and negative.     Physical Exam/Data:   Vitals:   10/06/22 1243 10/06/22 1244  BP:  (!) 170/102  Pulse:  (!) 57  Resp:  18  Temp:  97.8 F (36.6 C)  TempSrc:  Oral  SpO2:  94%  Weight: 99.8 kg   Height: 6' (1.829 m)    No intake or output data in the 24 hours ending 10/06/22 1559    10/06/2022   12:43 PM  Last 3 Weights  Weight (lbs) 220 lb  Weight (kg) 99.791 kg     Body mass index is 29.84 kg/m.  General:  Well nourished, well developed, in no acute distress HEENT: normal Neck: no JVD Vascular: No carotid bruits; Distal pulses 2+ bilaterally Cardiac:  normal S1, S2; RRR; no murmur  Lungs:  clear to auscultation bilaterally, no wheezing, rhonchi or rales  Abd: soft, nontender, no hepatomegaly  Ext: no edema Musculoskeletal:  LLE shorter due to fracture with limited ROM, BUE and RLE strength normal and equal Skin: warm and dry  Neuro:  CNs 2-12 intact, no focal abnormalities noted Psych:  Normal affect   EKG:  The EKG was personally reviewed and demonstrates: Sinus bradycardia with a rate of 57, LVH, ST depression T wave inversions in lead I, II and V3-V6 Telemetry:  Telemetry was personally reviewed and demonstrates:  not currently on telemetry  Relevant CV Studies: Echocardiogram ordered and pending  Laboratory Data:  High Sensitivity Troponin:   Recent Labs  Lab 10/06/22 1254 10/06/22 1511  TROPONINIHS 10 11     Chemistry Recent Labs  Lab 10/06/22 1254  NA 134*  K 3.6  CL 103  CO2 23  GLUCOSE 148*  BUN 23  CREATININE 0.97  CALCIUM 8.3*  GFRNONAA >60  ANIONGAP 8    Recent Labs  Lab 10/06/22 1254  PROT 6.9  ALBUMIN 3.6  AST 22  ALT 20  ALKPHOS 80  BILITOT 0.5   Lipids  Recent Labs  Lab 10/06/22 1511  CHOL 223*  TRIG 140  HDL 43  LDLCALC 152*  CHOLHDL 5.2    Hematology Recent Labs  Lab 10/06/22 1254  WBC 10.9*  RBC 4.06*  HGB 11.5*  HCT 36.0*  MCV 88.7  MCH 28.3  MCHC 31.9  RDW 13.3  PLT 348   Thyroid No  results for input(s): "TSH", "FREET4" in the last 168 hours.  BNPNo results for input(s): "BNP", "PROBNP" in the last 168 hours.  DDimer No results for input(s): "DDIMER" in the last 168 hours.   Radiology/Studies:  DG Hip Unilat W or Wo Pelvis 2-3 Views Left  Result Date: 10/06/2022 CLINICAL DATA:  Left hip pain after fall. EXAM: DG HIP (WITH OR WITHOUT PELVIS) 2-3V LEFT COMPARISON:  None Available. FINDINGS: Acute fracture of the left femoral neck with angulation and foreshortening. Two round sclerotic lesions in the right iliac bone measuring up 1.5 cm.  Lower lumbar spine degenerative changes. Soft tissues are unremarkable. IMPRESSION: 1. Acute angulated left femoral neck fracture. 2. Two round sclerotic lesions in the right iliac bone are nonspecific. While these could represent bone islands, metastatic disease is not excluded. Correlate for history of malignancy. Electronically Signed   By: Titus Dubin M.D.   On: 10/06/2022 14:12   DG Chest Portable 1 View  Result Date: 10/06/2022 CLINICAL DATA:  Left hip fracture following a fall. EXAM: PORTABLE CHEST 1 VIEW COMPARISON:  None Available. FINDINGS: Enlarged cardiac silhouette. Tortuous aorta with deviation of the trachea to the right. Clear lungs with normal vascularity. Minimally prominent interstitial markings. Tiny calcified granuloma or bone island overlying each anterior 2nd rib. No fracture or pneumothorax seen. IMPRESSION: 1. No acute abnormality. 2. Cardiomegaly and minimal chronic interstitial lung disease. Electronically Signed   By: Claudie Revering M.D.   On: 10/06/2022 14:11     Assessment and Plan:   Abnormal EKG -Remains chest pain-free -No history of coronary artery disease -High-sensitivity troponin is negative x 2 -LDL 152 with a total cholesterol of 223 -Echocardiogram ordered and pending  Hip fracture status post mechanical fall -Left femoral neck fracture -Visual deformity of the left lower extremity -Continue with  pain management -Anticipate surgery tomorrow -Continues to be followed by orthopedics  Preoperative clearance    Mr. Wamble's perioperative risk of a major cardiac event is 0.4% according to the Revised Cardiac Risk Index (RCRI).  Therefore, he is at low risk for perioperative complications.   His functional capacity is good at 7.25 METs according to the Duke Activity Status Index (DASI). (Before his fall) He denies any symptoms of angina or anginal equivalents.  He can be cleared for surgery with moderate risk is related to anesthesia and the surgery itself.  He denies any previous complication from ventral hernia repair surgery.    Risk Assessment/Risk Scores:                For questions or updates, please contact [place] Please consult [email or web address] for contact info under    Signed, Nalia Honeycutt, NP  10/06/2022 3:59 PM

## 2022-10-07 ENCOUNTER — Inpatient Hospital Stay: Payer: Worker's Compensation | Admitting: General Practice

## 2022-10-07 ENCOUNTER — Inpatient Hospital Stay: Payer: Worker's Compensation

## 2022-10-07 ENCOUNTER — Inpatient Hospital Stay (HOSPITAL_COMMUNITY)
Admit: 2022-10-07 | Discharge: 2022-10-07 | Disposition: A | Discharge status: Home / Self Care | Payer: Worker's Compensation | Attending: Cardiovascular Disease | Admitting: Cardiovascular Disease

## 2022-10-07 ENCOUNTER — Other Ambulatory Visit: Payer: Self-pay

## 2022-10-07 ENCOUNTER — Encounter
Admission: EM | Disposition: A | Discharge status: Home / Self Care | Payer: Self-pay | Source: Home / Self Care | Attending: Internal Medicine

## 2022-10-07 DIAGNOSIS — R9431 Abnormal electrocardiogram [ECG] [EKG]: Secondary | ICD-10-CM

## 2022-10-07 DIAGNOSIS — Z01818 Encounter for other preprocedural examination: Secondary | ICD-10-CM

## 2022-10-07 HISTORY — PX: HIP ARTHROPLASTY: SHX981

## 2022-10-07 LAB — HEMOGLOBIN A1C
Hgb A1c MFr Bld: 6 % — ABNORMAL HIGH (ref 4.8–5.6)
Mean Plasma Glucose: 126 mg/dL

## 2022-10-07 LAB — ECHOCARDIOGRAM COMPLETE
AR max vel: 2.6 cm2
AV Peak grad: 10.8 mmHg
Ao pk vel: 1.64 m/s
Area-P 1/2: 3.42 cm2
Height: 72 in
S' Lateral: 2.8 cm
Weight: 3520 oz

## 2022-10-07 SURGERY — HEMIARTHROPLASTY, HIP, DIRECT ANTERIOR APPROACH, FOR FRACTURE
Anesthesia: General | Site: Hip | Laterality: Left

## 2022-10-07 MED ORDER — PHENYLEPHRINE 80 MCG/ML (10ML) SYRINGE FOR IV PUSH (FOR BLOOD PRESSURE SUPPORT)
PREFILLED_SYRINGE | INTRAVENOUS | Status: AC
  Filled 2022-10-07: qty 10

## 2022-10-07 MED ORDER — POLYETHYLENE GLYCOL 3350 17 G PO PACK: 17.0000 g | PACK | Freq: Every day | ORAL | Status: DC | PRN

## 2022-10-07 MED ORDER — LIDOCAINE HCL (CARDIAC) PF 100 MG/5ML IV SOSY
PREFILLED_SYRINGE | INTRAVENOUS | Status: DC | PRN
  Administered 2022-10-07: 100 mg via INTRAVENOUS

## 2022-10-07 MED ORDER — HYDROMORPHONE HCL 1 MG/ML IJ SOLN
INTRAMUSCULAR | Status: AC
  Filled 2022-10-07: qty 1

## 2022-10-07 MED ORDER — GLYCOPYRROLATE 0.2 MG/ML IJ SOLN
INTRAMUSCULAR | Status: DC | PRN
  Administered 2022-10-07: .2 mg via INTRAVENOUS

## 2022-10-07 MED ORDER — ONDANSETRON HCL 4 MG/2ML IJ SOLN
4.0000 mg | Freq: Four times a day (QID) | INTRAMUSCULAR | Status: DC | PRN
  Administered 2022-10-08 (×2): 4 mg via INTRAVENOUS
  Filled 2022-10-07 (×2): qty 2

## 2022-10-07 MED ORDER — MIDAZOLAM HCL 2 MG/2ML IJ SOLN
INTRAMUSCULAR | Status: DC | PRN
  Administered 2022-10-07: 2 mg via INTRAVENOUS

## 2022-10-07 MED ORDER — DEXAMETHASONE SODIUM PHOSPHATE 10 MG/ML IJ SOLN
INTRAMUSCULAR | Status: AC
  Filled 2022-10-07: qty 1

## 2022-10-07 MED ORDER — SENNA 8.6 MG PO TABS
1.0000 | ORAL_TABLET | Freq: Two times a day (BID) | ORAL | Status: DC
  Administered 2022-10-08 – 2022-10-10 (×5): 8.6 mg via ORAL
  Filled 2022-10-07 (×6): qty 1

## 2022-10-07 MED ORDER — ALUM & MAG HYDROXIDE-SIMETH 200-200-20 MG/5ML PO SUSP: 30.0000 mL | ORAL | Status: DC | PRN

## 2022-10-07 MED ORDER — NEOMYCIN-POLYMYXIN B GU 40-200000 IR SOLN
Status: DC | PRN
  Administered 2022-10-07: 16 mL

## 2022-10-07 MED ORDER — OXYCODONE HCL 5 MG PO TABS
10.0000 mg | ORAL_TABLET | ORAL | Status: DC | PRN
  Administered 2022-10-09: 10 mg via ORAL
  Filled 2022-10-07: qty 2

## 2022-10-07 MED ORDER — LIDOCAINE HCL (PF) 2 % IJ SOLN
INTRAMUSCULAR | Status: AC
  Filled 2022-10-07: qty 5

## 2022-10-07 MED ORDER — METHOCARBAMOL 500 MG PO TABS
500.0000 mg | ORAL_TABLET | Freq: Four times a day (QID) | ORAL | Status: DC | PRN
  Administered 2022-10-08 – 2022-10-09 (×2): 500 mg via ORAL
  Filled 2022-10-07 (×2): qty 1

## 2022-10-07 MED ORDER — BISACODYL 10 MG RE SUPP: 10.0000 mg | Freq: Every day | RECTAL | Status: DC | PRN

## 2022-10-07 MED ORDER — ONDANSETRON HCL 4 MG/2ML IJ SOLN
INTRAMUSCULAR | Status: AC
  Filled 2022-10-07: qty 2

## 2022-10-07 MED ORDER — MAGNESIUM CITRATE PO SOLN: 1.0000 | Freq: Once | ORAL | Status: DC | PRN

## 2022-10-07 MED ORDER — MENTHOL 3 MG MT LOZG: 1.0000 | LOZENGE | OROMUCOSAL | Status: DC | PRN

## 2022-10-07 MED ORDER — SUGAMMADEX SODIUM 200 MG/2ML IV SOLN
INTRAVENOUS | Status: DC | PRN
  Administered 2022-10-07: 200 mg via INTRAVENOUS

## 2022-10-07 MED ORDER — SODIUM CHLORIDE 0.9 % IV SOLN: INTRAVENOUS | Status: DC

## 2022-10-07 MED ORDER — HYDROMORPHONE HCL 1 MG/ML IJ SOLN: 0.5000 mg | INTRAMUSCULAR | Status: DC | PRN

## 2022-10-07 MED ORDER — ENOXAPARIN SODIUM 40 MG/0.4ML IJ SOSY
40.0000 mg | PREFILLED_SYRINGE | INTRAMUSCULAR | Status: DC
  Administered 2022-10-08 – 2022-10-09 (×2): 40 mg via SUBCUTANEOUS
  Filled 2022-10-07 (×2): qty 0.4

## 2022-10-07 MED ORDER — ACETAMINOPHEN 325 MG PO TABS: 325.0000 mg | ORAL_TABLET | Freq: Four times a day (QID) | ORAL | Status: DC | PRN

## 2022-10-07 MED ORDER — FENTANYL CITRATE (PF) 100 MCG/2ML IJ SOLN: 25.0000 ug | INTRAMUSCULAR | Status: DC | PRN

## 2022-10-07 MED ORDER — ACETAMINOPHEN 10 MG/ML IV SOLN
INTRAVENOUS | Status: DC | PRN
  Administered 2022-10-07: 1000 mg via INTRAVENOUS

## 2022-10-07 MED ORDER — ONDANSETRON HCL 4 MG PO TABS: 4.0000 mg | ORAL_TABLET | Freq: Four times a day (QID) | ORAL | Status: DC | PRN

## 2022-10-07 MED ORDER — DOCUSATE SODIUM 100 MG PO CAPS
100.0000 mg | ORAL_CAPSULE | Freq: Two times a day (BID) | ORAL | Status: DC
  Administered 2022-10-08 – 2022-10-10 (×5): 100 mg via ORAL
  Filled 2022-10-07 (×6): qty 1

## 2022-10-07 MED ORDER — HYDRALAZINE HCL 20 MG/ML IJ SOLN
10.0000 mg | INTRAMUSCULAR | Status: DC | PRN
  Administered 2022-10-08: 10 mg via INTRAVENOUS
  Filled 2022-10-07: qty 1

## 2022-10-07 MED ORDER — DEXAMETHASONE SODIUM PHOSPHATE 10 MG/ML IJ SOLN
INTRAMUSCULAR | Status: DC | PRN
  Administered 2022-10-07: 10 mg via INTRAVENOUS

## 2022-10-07 MED ORDER — FENTANYL CITRATE (PF) 100 MCG/2ML IJ SOLN
INTRAMUSCULAR | Status: AC
  Filled 2022-10-07: qty 2

## 2022-10-07 MED ORDER — ROCURONIUM BROMIDE 10 MG/ML (PF) SYRINGE
PREFILLED_SYRINGE | INTRAVENOUS | Status: AC
  Filled 2022-10-07: qty 10

## 2022-10-07 MED ORDER — MIDAZOLAM HCL 2 MG/2ML IJ SOLN
INTRAMUSCULAR | Status: AC
  Filled 2022-10-07: qty 2

## 2022-10-07 MED ORDER — ONDANSETRON HCL 4 MG/2ML IJ SOLN
INTRAMUSCULAR | Status: DC | PRN
  Administered 2022-10-07: 4 mg via INTRAVENOUS

## 2022-10-07 MED ORDER — PROPOFOL 10 MG/ML IV BOLUS
INTRAVENOUS | Status: DC | PRN
  Administered 2022-10-07: 50 mg via INTRAVENOUS
  Administered 2022-10-07: 150 mg via INTRAVENOUS

## 2022-10-07 MED ORDER — OXYCODONE HCL 5 MG PO TABS
ORAL_TABLET | ORAL | Status: AC
  Filled 2022-10-07: qty 1

## 2022-10-07 MED ORDER — PHENYLEPHRINE HCL (PRESSORS) 10 MG/ML IV SOLN
INTRAVENOUS | Status: DC | PRN
  Administered 2022-10-07: 80 ug via INTRAVENOUS
  Administered 2022-10-07: 160 ug via INTRAVENOUS
  Administered 2022-10-07: 80 ug via INTRAVENOUS

## 2022-10-07 MED ORDER — GLYCOPYRROLATE 0.2 MG/ML IJ SOLN
INTRAMUSCULAR | Status: AC
  Filled 2022-10-07: qty 1

## 2022-10-07 MED ORDER — DEXMEDETOMIDINE HCL IN NACL 80 MCG/20ML IV SOLN
INTRAVENOUS | Status: DC | PRN
  Administered 2022-10-07 (×2): 12 ug via INTRAVENOUS

## 2022-10-07 MED ORDER — ROCURONIUM BROMIDE 100 MG/10ML IV SOLN
INTRAVENOUS | Status: DC | PRN
  Administered 2022-10-07: 60 mg via INTRAVENOUS
  Administered 2022-10-07: 20 mg via INTRAVENOUS

## 2022-10-07 MED ORDER — CEFAZOLIN SODIUM-DEXTROSE 2-4 GM/100ML-% IV SOLN
INTRAVENOUS | Status: AC
  Filled 2022-10-07: qty 100

## 2022-10-07 MED ORDER — PHENOL 1.4 % MT LIQD: 1.0000 | OROMUCOSAL | Status: DC | PRN

## 2022-10-07 MED ORDER — HYDROMORPHONE HCL 1 MG/ML IJ SOLN
INTRAMUSCULAR | Status: DC | PRN
  Administered 2022-10-07: 1 mg via INTRAVENOUS

## 2022-10-07 MED ORDER — FENTANYL CITRATE (PF) 100 MCG/2ML IJ SOLN
INTRAMUSCULAR | Status: DC | PRN
  Administered 2022-10-07 (×2): 100 ug via INTRAVENOUS

## 2022-10-07 MED ORDER — 0.9 % SODIUM CHLORIDE (POUR BTL) OPTIME
TOPICAL | Status: DC | PRN
  Administered 2022-10-07: 1000 mL

## 2022-10-07 MED ORDER — CEFAZOLIN SODIUM-DEXTROSE 2-3 GM-%(50ML) IV SOLR
INTRAVENOUS | Status: DC | PRN
  Administered 2022-10-07: 2 g via INTRAVENOUS

## 2022-10-07 MED ORDER — SEVOFLURANE IN SOLN
RESPIRATORY_TRACT | Status: AC
  Filled 2022-10-07: qty 250

## 2022-10-07 MED ORDER — METHOCARBAMOL 1000 MG/10ML IJ SOLN: 500.0000 mg | Freq: Four times a day (QID) | INTRAVENOUS | Status: DC | PRN

## 2022-10-07 MED ORDER — ACETAMINOPHEN 500 MG PO TABS
1000.0000 mg | ORAL_TABLET | Freq: Four times a day (QID) | ORAL | Status: AC
  Administered 2022-10-07 – 2022-10-08 (×3): 1000 mg via ORAL
  Filled 2022-10-07 (×5): qty 2

## 2022-10-07 MED ORDER — OXYCODONE HCL 5 MG/5ML PO SOLN: 5.0000 mg | Freq: Once | ORAL | Status: AC | PRN

## 2022-10-07 MED ORDER — ACETAMINOPHEN 10 MG/ML IV SOLN
INTRAVENOUS | Status: AC
  Filled 2022-10-07: qty 100

## 2022-10-07 MED ORDER — CEFAZOLIN SODIUM-DEXTROSE 2-4 GM/100ML-% IV SOLN
2.0000 g | Freq: Four times a day (QID) | INTRAVENOUS | Status: AC
  Administered 2022-10-07: 2 g via INTRAVENOUS
  Filled 2022-10-07 (×2): qty 100

## 2022-10-07 MED ORDER — OXYCODONE HCL 5 MG PO TABS
5.0000 mg | ORAL_TABLET | ORAL | Status: DC | PRN
  Administered 2022-10-08 (×2): 10 mg via ORAL
  Administered 2022-10-09 – 2022-10-10 (×3): 5 mg via ORAL
  Filled 2022-10-07: qty 1
  Filled 2022-10-07 (×2): qty 2
  Filled 2022-10-07: qty 1
  Filled 2022-10-07: qty 2

## 2022-10-07 MED ORDER — TRANEXAMIC ACID-NACL 1000-0.7 MG/100ML-% IV SOLN
INTRAVENOUS | Status: DC | PRN
  Administered 2022-10-07: 1000 mg via INTRAVENOUS

## 2022-10-07 MED ORDER — OXYCODONE HCL 5 MG PO TABS
5.0000 mg | ORAL_TABLET | Freq: Once | ORAL | Status: AC | PRN
  Administered 2022-10-07: 5 mg via ORAL

## 2022-10-07 MED ORDER — TRAMADOL HCL 50 MG PO TABS
50.0000 mg | ORAL_TABLET | Freq: Four times a day (QID) | ORAL | Status: DC
  Administered 2022-10-07 – 2022-10-10 (×11): 50 mg via ORAL
  Filled 2022-10-07 (×11): qty 1

## 2022-10-07 SURGICAL SUPPLY — 55 items
BLADE SAGITTAL WIDE XTHICK NO (BLADE) ×1 IMPLANT
BLADE SURG SZ10 CARB STEEL (BLADE) ×1 IMPLANT
BNDG COHESIVE 4X5 TAN STRL LF (GAUZE/BANDAGES/DRESSINGS) ×1 IMPLANT
COVER BACK TABLE REUSABLE LG (DRAPES) ×1 IMPLANT
DRAPE 3/4 80X56 (DRAPES) ×2 IMPLANT
DRAPE INCISE IOBAN 66X60 STRL (DRAPES) ×1 IMPLANT
DRAPE ORTHO SPLIT 77X108 STRL (DRAPES) ×2
DRAPE SURG 17X11 SM STRL (DRAPES) ×1 IMPLANT
DRAPE SURG ORHT 6 SPLT 77X108 (DRAPES) ×2 IMPLANT
DRAPE U-SHAPE 47X51 STRL (DRAPES) IMPLANT
DRAPE U-SHAPE 48X52 POLY STRL (PACKS) ×1 IMPLANT
DRSG AQUACEL AG ADV 3.5X10 (GAUZE/BANDAGES/DRESSINGS) ×1 IMPLANT
DRSG XEROFORM 1X8 (GAUZE/BANDAGES/DRESSINGS) IMPLANT
DURAPREP 26ML APPLICATOR (WOUND CARE) ×4 IMPLANT
ELECT CAUTERY BLADE 6.4 (BLADE) ×1 IMPLANT
ELECT REM PT RETURN 9FT ADLT (ELECTROSURGICAL) ×1
ELECTRODE REM PT RTRN 9FT ADLT (ELECTROSURGICAL) ×1 IMPLANT
FEMORAL HEAD LFIT V40 28MM PL0 (Orthopedic Implant) IMPLANT
GAUZE 4X4 16PLY ~~LOC~~+RFID DBL (SPONGE) ×1 IMPLANT
GAUZE XEROFORM 1X8 LF (GAUZE/BANDAGES/DRESSINGS) ×2 IMPLANT
GLOVE BIOGEL PI IND STRL 9 (GLOVE) ×1 IMPLANT
GLOVE BIOGEL PI ORTHO SZ9 (GLOVE) ×4 IMPLANT
GOWN STRL REUS TWL 2XL XL LVL4 (GOWN DISPOSABLE) ×1 IMPLANT
GOWN STRL REUS W/ TWL LRG LVL3 (GOWN DISPOSABLE) ×1 IMPLANT
GOWN STRL REUS W/TWL LRG LVL3 (GOWN DISPOSABLE) ×1
HEAD BIPOLAR LOCK UHR 28X52 (Head) IMPLANT
HOLDER FOLEY CATH W/STRAP (MISCELLANEOUS) ×1 IMPLANT
HOLSTER ELECTROSUGICAL PENCIL (MISCELLANEOUS) ×1 IMPLANT
IV NS IRRIG 3000ML ARTHROMATIC (IV SOLUTION) ×1 IMPLANT
KIT TURNOVER KIT A (KITS) ×1 IMPLANT
MANIFOLD NEPTUNE II (INSTRUMENTS) ×1 IMPLANT
NDL FILTER BLUNT 18X1 1/2 (NEEDLE) ×1 IMPLANT
NDL MAYO CATGUT SZ4 TPR NDL (NEEDLE) ×1 IMPLANT
NDL SAFETY ECLIP 18X1.5 (MISCELLANEOUS) ×1 IMPLANT
NEEDLE FILTER BLUNT 18X1 1/2 (NEEDLE) ×1 IMPLANT
NEEDLE MAYO CATGUT SZ4 (NEEDLE) ×1 IMPLANT
NS IRRIG 1000ML POUR BTL (IV SOLUTION) ×1 IMPLANT
PACK HIP PROSTHESIS (MISCELLANEOUS) ×1 IMPLANT
PILLOW ABDUCTION FOAM SM (MISCELLANEOUS) ×1 IMPLANT
PULSAVAC PLUS IRRIG FAN TIP (DISPOSABLE) ×1
RETRIEVER SUT HEWSON (MISCELLANEOUS) IMPLANT
SPONGE T-LAP 18X18 ~~LOC~~+RFID (SPONGE) ×1 IMPLANT
STAPLER SKIN PROX 35W (STAPLE) ×1 IMPLANT
STEM 37MM HIP (Hips) IMPLANT
SUT TICRON 2-0 30IN 311381 (SUTURE) ×4 IMPLANT
SUT VIC AB 0 CT1 36 (SUTURE) ×1 IMPLANT
SUT VIC AB 2-0 CT2 27 (SUTURE) ×2 IMPLANT
SYR 10ML LL (SYRINGE) ×1 IMPLANT
TAPE TRANSPORE STRL 2 31045 (GAUZE/BANDAGES/DRESSINGS) ×1 IMPLANT
TIP BRUSH PULSAVAC PLUS 24.33 (MISCELLANEOUS) ×1 IMPLANT
TIP FAN IRRIG PULSAVAC PLUS (DISPOSABLE) ×1 IMPLANT
TRAP FLUID SMOKE EVACUATOR (MISCELLANEOUS) ×1 IMPLANT
TRAY FOLEY SLVR 16FR LF STAT (SET/KITS/TRAYS/PACK) ×1 IMPLANT
TUBE SUCT KAM VAC (TUBING) IMPLANT
WATER STERILE IRR 500ML POUR (IV SOLUTION) ×1 IMPLANT

## 2022-10-07 NOTE — Anesthesia Preprocedure Evaluation (Signed)
Anesthesia Evaluation  Patient identified by MRN, date of birth, ID band Patient awake    Reviewed: Allergy & Precautions, NPO status , Patient's Chart, lab work & pertinent test results  Airway Mallampati: III  TM Distance: >3 FB Neck ROM: full    Dental  (+) Chipped, Dental Advidsory Given   Pulmonary neg pulmonary ROS, neg COPD   Pulmonary exam normal        Cardiovascular (-) Past MI and (-) CABG negative cardio ROS Normal cardiovascular exam     Neuro/Psych negative neurological ROS  negative psych ROS   GI/Hepatic negative GI ROS, Neg liver ROS,,,  Endo/Other  negative endocrine ROS    Renal/GU      Musculoskeletal   Abdominal   Peds  Hematology  (+) Blood dyscrasia, anemia   Anesthesia Other Findings Past Medical History: No date: Engages in vaping No date: H/O ventral hernia     Comment:  S/p of repair No date: Marijuana use  Past Surgical History: No date: Ventral hernia repair  BMI    Body Mass Index: 29.84 kg/m      Reproductive/Obstetrics negative OB ROS                             Anesthesia Physical Anesthesia Plan  ASA: 2 and emergent  Anesthesia Plan: General ETT   Post-op Pain Management: Dilaudid IV   Induction: Intravenous  PONV Risk Score and Plan: 2 and Midazolam, Dexamethasone and Ondansetron  Airway Management Planned: Oral ETT  Additional Equipment:   Intra-op Plan:   Post-operative Plan: Extubation in OR  Informed Consent: I have reviewed the patients History and Physical, chart, labs and discussed the procedure including the risks, benefits and alternatives for the proposed anesthesia with the patient or authorized representative who has indicated his/her understanding and acceptance.     Dental Advisory Given  Plan Discussed with: Anesthesiologist, CRNA and Surgeon  Anesthesia Plan Comments: (Patient consented for risks of  anesthesia including but not limited to:  - adverse reactions to medications - damage to eyes, teeth, lips or other oral mucosa - nerve damage due to positioning  - sore throat or hoarseness - Damage to heart, brain, nerves, lungs, other parts of body or loss of life  Patient voiced understanding.)        Anesthesia Quick Evaluation

## 2022-10-07 NOTE — Anesthesia Procedure Notes (Signed)
Procedure Name: Intubation Date/Time: 10/07/2022 11:22 AM  Performed by: Rolla Plate, CRNAPre-anesthesia Checklist: Patient identified, Patient being monitored, Timeout performed, Emergency Drugs available and Suction available Patient Re-evaluated:Patient Re-evaluated prior to induction Oxygen Delivery Method: Circle system utilized Preoxygenation: Pre-oxygenation with 100% oxygen Induction Type: IV induction Ventilation: Mask ventilation without difficulty and Two handed mask ventilation required Laryngoscope Size: McGraph and 4 Grade View: Grade I Tube type: Oral Tube size: 7.5 mm Number of attempts: 1 Airway Equipment and Method: Stylet and Video-laryngoscopy Placement Confirmation: ETT inserted through vocal cords under direct vision, positive ETCO2 and breath sounds checked- equal and bilateral Secured at: 21 cm Tube secured with: Tape Dental Injury: Teeth and Oropharynx as per pre-operative assessment

## 2022-10-07 NOTE — Assessment & Plan Note (Signed)
Resulted in left hip fracture. Of note, patient declined CT head and C-spine on admission. He denies head injury or LOC.

## 2022-10-07 NOTE — Assessment & Plan Note (Signed)
Due to mechanical fall.   --Mgmt per orthopedic surgery, Dr. Mack Guise --s/p Left hip hemiarthroplasty on 10/07/22 --Pain control PRN  --Robaxin PRN muscle spasm --Bowel regimen --PT/OT evaluation ORTHO RECOMMENDS -- --Follow-up in 10 to 14 days for staple removal. --Continue enteric-coated aspirin 325 mg p.o. twice daily for DVT prophylaxis until follow-up. --Weightbearing as tolerated on left leg  --Continue posterior hip precautions for at least the first several months after surgery.    3/10 -- pt had orthostatic dizziness which limited ability for full PT evaluation 3/11 -- AM PT assessment limited by uncontrolled pain, dizziness improved 3/12 -- improved with PT, d/c with [facility]

## 2022-10-07 NOTE — Progress Notes (Signed)
Progress Note   Patient: Christian Hughes Hughes [id number] DOB: September 27, 1960 DOA: 10/06/2022     1 DOS: the patient was seen and examined on 10/07/2022   Brief hospital course: HPI on admission 3/8, by Dr. Blaine Hamper: "Christian Hughes Hughes is a 62 y.o. male without significant past medical history except for occasional vaping amd marijuana use, who presents with fall and left hip pain.      Pt states that he slipped and fell at work at about 11:00 AM.  Denies loss of consciousness.  He injured his left hip, causing left hip pain, which is constant, sharp, severe, nonradiating, aggravated by movement.  No leg numbness.  Patient denies chest pain, cough, shortness breath.  No nausea vomiting, diarrhea or abdominal pain.  No symptoms of UTI.  Patient strongly denies any head or neck injury.  He refused CT scan of head and neck.   Data reviewed independently and ED Course: pt was found to have WBC 10.9, hemoglobin 11.5, GFR> 60, temperature normal, blood pressure 170/102, heart rate 57, RR 18, oxygen saturation 94% on room air.  Chest x-ray showed cardiomegaly and minimal interstitial disease.  X-ray of left hip showed left femoral neck fracture with some sclerotic bone lesion.  Patient is admitted to telemetry bed as inpatient.  Dr. Rockey Situ of card and Dr. Mack Guise of Ortho are consulted.     X-ray of left hip 1. Acute angulated left femoral neck fracture. 2. Two round sclerotic lesions in the right iliac bone are nonspecific. While these could represent bone islands, metastatic disease is not excluded. Correlate for history of malignancy."   3/9 -- patient taken to OR by orthopedic surgery.  Assessment and Plan: * Closed left hip fracture, initial encounter ([place]) Due to mechanical fall.   --Mgmt per orthopedic surgery --Going to OR this afternoon for repair --Pain control PRN per orders --Robaxin PRN muscle spasm --Resume diet post-op today --Bowel regimen --Post-op PT evaluation for d/c  planning  Fall Resulted in left hip fracture. Of note, patient declined CT head and C-spine on admission. He denies head injury or LOC.  Abnormal EKG EKG with T wave inversion in lateral leads and V4-V6, ST elevation in V1-V2.  Pt denies any chest pain, cough, shortness breath.  Initial troponin 10 -Consulted Dr. Rockey Situ for cardiology for presurgical clearance - granted - Follow pending Echo  Normocytic anemia Mild. Hemoglobin 11.5 on admission, no baseline hemoglobin available --Monitor CBC  Bone lesion_sclerotic lesions in the right iliac bone This is an incidental finding by x-ray which showed two round sclerotic lesions in the right iliac bone, nonspecific. While these could represent bone islands, metastatic disease is not excluded.  -need to give referral to oncology for outpt follow up.  Elevated blood pressure reading -IV hydralazine as needed while NPO        Subjective: Pt awake resting in bed with family visiting, seen before surgery this AM.  He reports some pain but overall it's been fairly controlled.  No other acute complaints.     Physical Exam: Vitals:   10/07/22 0757 10/07/22 1349 10/07/22 1400 10/07/22 1415  BP: (!) 196/115 117/89 (!) 177/106 (!) 172/91  Pulse: 89 75 79 67  Resp: '18 14 13 15  '$ Temp: 98.8 F (37.1 C) 98.2 F (36.8 C)  98.4 F (36.9 C)  TempSrc:      SpO2: 97% 95% 97% 94%  Weight:      Height:       General exam: awake, alert, no  acute distress HEENT: atraumatic, clear conjunctiva, anicteric sclera, moist mucus membranes, hearing grossly normal  Respiratory system: CTAB, no wheezes, rales or rhonchi, normal respiratory effort. Cardiovascular system: normal S1/S2, RRR, no JVD, murmurs, rubs, gallops,  no pedal edema.   Gastrointestinal system: soft, NT, ND, no HSM felt, +bowel sounds. Central nervous system: A&O x 4. no gross focal neurologic deficits, normal speech Extremities: LLE externally rotated and shortened, no edema, normal  tone Skin: dry, intact, normal temperature Psychiatry: normal mood, congruent affect, judgement and insight appear normal   Data Reviewed:  Notable labs --- total cholesterol 223, LDL 152, normal HDL and TG's.  No other new labs this AM.  Family Communication: at bedside on rounds  Disposition: Status is: Inpatient Remains inpatient appropriate because: surgery today for fracture repair.  PT evaluation pending tomorrow for d/c planning.  Possible d/c as soon as tomorrow depending on ability to return home, pain controlled and cleared for d/c from ortho.   Planned Discharge Destination: Home with Home Health    Time spent: 38 minutes  Author: Ezekiel Slocumb, DO 10/07/2022 2:29 PM  For on call review [email or web address].

## 2022-10-07 NOTE — Transfer of Care (Signed)
Immediate Anesthesia Transfer of Care Note  Patient: Christian Hughes  Procedure(s) Performed: ARTHROPLASTY BIPOLAR HIP (HEMIARTHROPLASTY) (Left: Hip)  Patient Location: PACU  Anesthesia Type:General  Level of Consciousness: sedated  Airway & Oxygen Therapy: Patient Spontanous Breathing and Patient connected to face mask oxygen  Post-op Assessment: Report given to RN and Post -op Vital signs reviewed and stable  Post vital signs: Reviewed  Last Vitals:  Vitals Value Taken Time  BP    Temp    Pulse 73 10/07/22 1349  Resp 8 10/07/22 1349  SpO2 95 % 10/07/22 1349  Vitals shown include unvalidated device data.  Last Pain:      Patients Stated Pain Goal: 2 ([id number])  Complications: No notable events documented.

## 2022-10-07 NOTE — Assessment & Plan Note (Signed)
EKG with T wave inversion in lateral leads and V4-V6, ST elevation in V1-V2.  Pt denies any chest pain, cough, shortness breath.  Initial troponin 10 -Consulted Dr. Rockey Situ for cardiology for presurgical clearance - granted - Follow pending Echo

## 2022-10-07 NOTE — Assessment & Plan Note (Signed)
This is an incidental finding by x-ray which showed two round sclerotic lesions in the right iliac bone, nonspecific. While these could represent bone islands, metastatic disease is not excluded.  -need to give referral to oncology for outpt follow up.

## 2022-10-07 NOTE — Anesthesia Postprocedure Evaluation (Signed)
Anesthesia Post Note  Patient: Christian Hughes  Procedure(s) Performed: ARTHROPLASTY BIPOLAR HIP (HEMIARTHROPLASTY) (Left: Hip)  Patient location during evaluation: PACU Anesthesia Type: General Level of consciousness: awake and alert Pain management: pain level controlled Vital Signs Assessment: post-procedure vital signs reviewed and stable Respiratory status: spontaneous breathing, nonlabored ventilation, respiratory function stable and patient connected to nasal cannula oxygen Cardiovascular status: blood pressure returned to baseline and stable Postop Assessment: no apparent nausea or vomiting Anesthetic complications: no  No notable events documented.   Last Vitals:  Vitals:   10/07/22 1600 10/07/22 1958  BP: (!) 155/106 (!) 182/118  Pulse: 89 89  Resp: 16 18  Temp: 36.8 C 36.9 C  SpO2:  97%    Last Pain:  Vitals:   10/07/22 1600  TempSrc: Oral  PainSc:                  [name]

## 2022-10-07 NOTE — Op Note (Signed)
10/07/2022  1:58 PM  PATIENT:  Christian Hughes   MRN: [id number]  PRE-OPERATIVE DIAGNOSIS:  Left femoral neck fracture  POST-OPERATIVE DIAGNOSIS:  Left femoral neck fracture  PROCEDURE:  Procedure(s): Left hip hemiarthroplasty   PREOPERATIVE INDICATIONS:    Christian Hughes is an 62 y.o. male who was admitted with a diagnosis of displaced left femoral neck fracture status post fall.  Given the displacement of the fracture, I have recommended treatment with a hemiarthroplasty for this injury. I have explained the surgery and the postoperative course to the patient and his son last night and both agreed with surgical management of this fracture.    The risks benefits and alternatives were discussed with the patient and their family including but not limited to the risks of  infection requiring removal of the prosthesis, bleeding requiring blood transfusion, nerve injury especially to the sciatic nerve leading to foot drop or lower extremity numbness, periprosthetic fracture, dislocation leg length discrepancy, change in lower extremity rotation persistent hip pain, loosening or failure of the components and the need for revision surgery, including conversion to a left total hip arthroplasty.   OPERATIVE REPORT     SURGEON:  Thornton Park, MD    ASSISTANT:  RN First assist, Brandy     ANESTHESIA: General    COMPLICATIONS:  None.   SPECIMEN: Femoral head to pathology    COMPONENTS:  Stryker Accolade 2, 127 degree femoral component size 7 with a Stryker size 52 UHR Universal Bipolar Head Component with a 26m +0 offset Stryker LFIT V40 Femoral Head.    PROCEDURE IN DETAIL:   The patient was met in the holding area and  identified.  The appropriate hip was identified and marked at the operative site after verbally confirming with the patient that this was the correct site of surgery.  The patient was then transported to the OR  and underwent general anesthesia.  The patient was then placed  in the lateral decubitus position with the operative side up and secured on the operating room table with a pegboard and all bony prominences were adequately padded. This included an axillary roll and additional padding around the nonoperative leg to prevent compression to the common peroneal nerve.    The operative lower extremity was prepped and draped in a sterile fashion.  A time out was performed prior to incision to verify patient's name, date of birth, medical record number, correct site of surgery correct procedure to be performed. The timeout was also used to verify the patient received antibiotics now appropriate instruments, implants and radiographic studies were available in the room. Once all in attendance were in agreement case began.  Patient received 2 g of Ancef IV as well as 1000 mg of tranexamic acid at the start of the case.    A posterolateral approach was utilized via sharp dissection  carried down to the subcutaneous tissue.  Bleeding vessels were coagulated using electrocautery.  The fascia lata was identified and incised along the length of the skin incision.  The gluteus maximus muscle was then split in line with its fibers. Self-retaining retractors were  inserted.  With the hip internally rotated, the short external rotators  were identified and removed from the posterior attachment from the greater trochanter. The piriformis was tagged for later repair. The capsule was identified and a T-shaped capsulotomy was performed. The capsule was tagged with #2 Tycron for later repair.  The femoral neck fracture was exposed, and the femoral head was removed  using a corkscrew device. This was measured to be 52 mm in diameter. The attention was then turned to proximal femur preparation.  An oscillating saw was used to perform a proximal femoral osteotomy 1 fingerbreadth above the lesser trochanter. The trial 52 mm femoral head was placed into the acetabulum and had an excellent suction fit.  The attention was then turned back to femoral preparation.    A femoral skid and Cobra retractor were placed under the femoral neck to allow for adequate visualization. A box osteotome was used to make the initial entry into the proximal femur. A single hand reamer was used to prepare the femoral canal. A T-shaped femoral canal sounder was then used to ensure no penetration femoral cortex had occurred during reaming. The proximal femur was then sequentially broached by hand. A size 7 femoral trial broach was found to have best medial to lateral canal fit. Once adequate mediolateral canal fill was achieved the trial femoral broach, neck, and head was assembled and the hip was reduced. It was found to have excellent stability, equivalent leg lengths with functional range of motion. The trial components were then removed.  I copiously irrigated the femoral canal and then impacted the real femoral prosthesis into place into the appropriate version, slightly anteverted to the normal anatomy, and I impacted the actual 28 mm + 0 offset Styker LFIT V40 femoral head femoral component onto the femoral trunion with a 52 mm UHR Universal Bipolar Component. The hip was then reduced and taken through functional range of motion and found to have excellent stability. Leg lengths were restored. The hip joint was copiously irrigated.   A soft tissue repair of the capsule and external rotators was performed using #2 Tycron Excellent posterior capsular repair was achieved.  The piriformis tendon was repaired to the base of the the abductor tendon.  Additional soft tissue repair was performed using #2 Tycron and drill holes through the posterior aspect of the greater trochanter.  The deep fascia was closed using interrupted 0 Vicryl.  The subcutaneous tissues were closed with 2-0 Vicryl and the skin approximated with staples.   The patient was then placed supine on the operative table. Leg lengths were checked clinically and  found to be equivalent. An abduction pillow was placed between the lower extremities. The patient was then transferred to a hospital bed and brought to the PACU in stable condition. I was scrubbed and present the entire case and all sharp and instrument counts were correct at the conclusion of the case. I spoke with the patient's son by phone from the PACU to let him know the case was completed without complication patient was stable in recovery room.   Timoteo Gaul, MD Orthopedic Surgeon

## 2022-10-07 NOTE — Hospital Course (Signed)
HPI on admission 3/8, by Dr. Blaine Hamper: "Christian Hughes is a 62 y.o. male without significant past medical history except for occasional vaping amd marijuana use, who presents with fall and left hip pain.      Pt states that he slipped and fell at work at about 11:00 AM.  Denies loss of consciousness.  He injured his left hip, causing left hip pain, which is constant, sharp, severe, nonradiating, aggravated by movement.  No leg numbness.  Patient denies chest pain, cough, shortness breath.  No nausea vomiting, diarrhea or abdominal pain.  No symptoms of UTI.  Patient strongly denies any head or neck injury.  He refused CT scan of head and neck.   Data reviewed independently and ED Course: pt was found to have WBC 10.9, hemoglobin 11.5, GFR> 60, temperature normal, blood pressure 170/102, heart rate 57, RR 18, oxygen saturation 94% on room air.  Chest x-ray showed cardiomegaly and minimal interstitial disease.  X-ray of left hip showed left femoral neck fracture with some sclerotic bone lesion.  Patient is admitted to telemetry bed as inpatient.  Dr. Rockey Situ of card and Dr. Mack Guise of Ortho are consulted.     X-ray of left hip 1. Acute angulated left femoral neck fracture. 2. Two round sclerotic lesions in the right iliac bone are nonspecific. While these could represent bone islands, metastatic disease is not excluded. Correlate for history of malignancy."   3/9 -- patient taken to OR by orthopedic surgery.

## 2022-10-07 NOTE — Plan of Care (Signed)

## 2022-10-07 NOTE — Assessment & Plan Note (Signed)
Mild. Hemoglobin 11.5 on admission, no baseline hemoglobin available --Monitor CBC

## 2022-10-07 NOTE — Progress Notes (Signed)
Patient underwent preop cardiovascular evaluation yesterday following a mechanical fall resulting in hip fracture.  Echocardiogram completed revealed LVEF of 55 to 60%, with no regional wall motion abnormalities, G1 DD, without any valvular abnormalities noted.  Cardiology will sign off today.

## 2022-10-07 NOTE — Progress Notes (Signed)
*  PRELIMINARY RESULTS* Echocardiogram 2D Echocardiogram has been performed.  Claretta Fraise 10/07/2022, 8:56 AM

## 2022-10-07 NOTE — Progress Notes (Addendum)
The patient has been re-examined, and the chart reviewed, and there have been no interval changes to the documented history and physical.  The left hip has been marked according to the hospital's correct site of surgery protocol.  Patient is going to have general anesthesia for the case.  The risks, benefits, and alternatives have been discussed at length, and the patient is willing to proceed.  I answered all the patient's questions.

## 2022-10-07 NOTE — Progress Notes (Signed)
Subjective:  POST OP CHECK s/p left hip hemiarthroplasty. Patient reports left hip pain as mild.  Patient's son and coworkers are in the room.  Patient has no complaints currently.  Objective:   VITALS:   Vitals:   10/07/22 1349 10/07/22 1400 10/07/22 1415 10/07/22 1430  BP: 117/89 (!) 177/106 (!) 172/91 (!) 170/102  Pulse: 75 79 67 72  Resp: '14 13 15 16  '$ Temp: 98.2 F (36.8 C)  98.4 F (36.9 C)   TempSrc:      SpO2: 95% 97% 94% 95%  Weight:      Height:        PHYSICAL EXAM: Left lower extremity Neurovascular intact Sensation intact distally Intact pulses distally Dorsiflexion/Plantar flexion intact Incision: dressing C/D/I No cellulitis present Compartment soft  LABS  No results found for this or any previous visit (from the past 24 hour(s)).  DG Hip Port Unilat With Pelvis 1V Left  Result Date: 10/07/2022 CLINICAL DATA:  Hip fracture. EXAM: DG HIP (WITH OR WITHOUT PELVIS) 1V PORT LEFT COMPARISON:  10/06/2022 FINDINGS: Patient has had interval LEFT hip hemiarthroplasty. Hardware is well seated. No interval fractures. Two sclerotic lesions are again seen in the RIGHT iliac bone, raising the question of metastatic disease. IMPRESSION: 1. Status post LEFT hip hemiarthroplasty. 2. Sclerotic lesions overlie the RIGHT iliac bone, raising the question of metastatic disease. Recommend correlation with history. Electronically Signed   By: Nolon Nations M.D.   On: 10/07/2022 14:48   ECHOCARDIOGRAM COMPLETE  Result Date: 10/07/2022    ECHOCARDIOGRAM REPORT   Patient Name:   Christian Hughes Date of Exam: 10/07/2022 Medical Rec #:  [id number]      Height:       72.0 in Accession #:    [id number]     Weight:       220.0 lb Date of Birth:  1961/03/24       BSA:          2.219 m Patient Age:    62 years       BP:           196/120 mmHg Patient Gender: M              HR:           73 bpm. Exam Location:  [facility] Procedure: 2D Echo Indications:     Preoperative Evaluation  History:          Patient has no prior history of Echocardiogram examinations.  Sonographer:     Kathlen Brunswick RDCS Referring Phys:  [place] Diagnosing Phys: Ida Rogue MD  Sonographer Comments: Suboptimal parasternal window and no subcostal window. Image acquisition challenging due to patient body habitus. IMPRESSIONS  1. Left ventricular ejection fraction, by estimation, is 55 to 60%. The left ventricle has normal function. The left ventricle has no regional wall motion abnormalities. There is moderate left ventricular hypertrophy. Left ventricular diastolic parameters are consistent with Grade I diastolic dysfunction (impaired relaxation).  2. Right ventricular systolic function is normal. The right ventricular size is normal.  3. The mitral valve is normal in structure. No evidence of mitral valve regurgitation. No evidence of mitral stenosis.  4. The aortic valve has an indeterminant number of cusps, unable to exclude bicuspid aortic valve. Aortic valve regurgitation is not visualized. Aortic valve sclerosis/calcification is present, without any evidence of aortic stenosis.  5. The inferior vena cava is normal in size with greater than 50% respiratory variability, suggesting right atrial  pressure of 3 mmHg. FINDINGS  Left Ventricle: Left ventricular ejection fraction, by estimation, is 55 to 60%. The left ventricle has normal function. The left ventricle has no regional wall motion abnormalities. The left ventricular internal cavity size was normal in size. There is  moderate left ventricular hypertrophy. Left ventricular diastolic parameters are consistent with Grade I diastolic dysfunction (impaired relaxation). Right Ventricle: The right ventricular size is normal. No increase in right ventricular wall thickness. Right ventricular systolic function is normal. Left Atrium: Left atrial size was normal in size. Right Atrium: Right atrial size was normal in size. Pericardium: There is no evidence of  pericardial effusion. Mitral Valve: The mitral valve is normal in structure. Mild mitral annular calcification. No evidence of mitral valve regurgitation. No evidence of mitral valve stenosis. Tricuspid Valve: The tricuspid valve is normal in structure. Tricuspid valve regurgitation is trivial. No evidence of tricuspid stenosis. Aortic Valve: The aortic valve has an indeterminant number of cusps. Aortic valve regurgitation is not visualized. Aortic valve sclerosis/calcification is present, without any evidence of aortic stenosis. Aortic valve peak gradient measures 10.8 mmHg. Pulmonic Valve: The pulmonic valve was normal in structure. Pulmonic valve regurgitation is not visualized. No evidence of pulmonic stenosis. Aorta: The aortic root is normal in size and structure. Venous: The inferior vena cava is normal in size with greater than 50% respiratory variability, suggesting right atrial pressure of 3 mmHg. IAS/Shunts: No atrial level shunt detected by color flow Doppler.  LEFT VENTRICLE PLAX 2D LVIDd:         4.00 cm   Diastology LVIDs:         2.80 cm   LV e' medial:    5.33 cm/s LV PW:         1.80 cm   LV E/e' medial:  12.7 LV IVS:        1.70 cm   LV e' lateral:   8.92 cm/s LVOT diam:     2.40 cm   LV E/e' lateral: 7.6 LV SV:         70 LV SV Index:   31 LVOT Area:     4.52 cm  RIGHT VENTRICLE RV Basal diam:  2.80 cm RV S prime:     15.90 cm/s TAPSE (M-mode): 1.9 cm LEFT ATRIUM             Index        RIGHT ATRIUM           Index LA diam:        4.20 cm 1.89 cm/m   RA Area:     18.90 cm LA Vol (A2C):   52.7 ml 23.75 ml/m  RA Volume:   50.30 ml  22.67 ml/m LA Vol (A4C):   48.8 ml 22.00 ml/m LA Biplane Vol: 56.2 ml 25.33 ml/m  AORTIC VALVE                 PULMONIC VALVE AV Area (Vmax): 2.60 cm     PV Vmax:        0.98 m/s AV Vmax:        164.00 cm/s  PV Peak grad:   3.8 mmHg AV Peak Grad:   10.8 mmHg    RVOT Peak grad: 2 mmHg LVOT Vmax:      94.20 cm/s LVOT Vmean:     65.700 cm/s LVOT VTI:       0.154 m   AORTA Ao Root diam: 3.70 cm Ao Asc diam:  2.90  cm MITRAL VALVE MV Area (PHT): 3.42 cm    SHUNTS MV Decel Time: 222 msec    Systemic VTI:  0.15 m MV E velocity: 67.60 cm/s  Systemic Diam: 2.40 cm MV A velocity: 89.10 cm/s MV E/A ratio:  0.76 Ida Rogue MD Electronically signed by Ida Rogue MD Signature Date/Time: 10/07/2022/10:23:06 AM    Final    DG Hip Unilat W or Wo Pelvis 2-3 Views Left  Result Date: 10/06/2022 CLINICAL DATA:  Left hip pain after fall. EXAM: DG HIP (WITH OR WITHOUT PELVIS) 2-3V LEFT COMPARISON:  None Available. FINDINGS: Acute fracture of the left femoral neck with angulation and foreshortening. Two round sclerotic lesions in the right iliac bone measuring up 1.5 cm. Lower lumbar spine degenerative changes. Soft tissues are unremarkable. IMPRESSION: 1. Acute angulated left femoral neck fracture. 2. Two round sclerotic lesions in the right iliac bone are nonspecific. While these could represent bone islands, metastatic disease is not excluded. Correlate for history of malignancy. Electronically Signed   By: Titus Dubin M.D.   On: 10/06/2022 14:12   DG Chest Portable 1 View  Result Date: 10/06/2022 CLINICAL DATA:  Left hip fracture following a fall. EXAM: PORTABLE CHEST 1 VIEW COMPARISON:  None Available. FINDINGS: Enlarged cardiac silhouette. Tortuous aorta with deviation of the trachea to the right. Clear lungs with normal vascularity. Minimally prominent interstitial markings. Tiny calcified granuloma or bone island overlying each anterior 2nd rib. No fracture or pneumothorax seen. IMPRESSION: 1. No acute abnormality. 2. Cardiomegaly and minimal chronic interstitial lung disease. Electronically Signed   By: Claudie Revering M.D.   On: 10/06/2022 14:11    Assessment/Plan: Day of Surgery   Principal Problem:   Closed left hip fracture, initial encounter [facility]) Active Problems:   Fall   Abnormal EKG   Normocytic anemia   Bone lesion_sclerotic lesions in the right iliac  bone   Elevated blood pressure reading  Patient is stable postop.  I reviewed his postoperative x-rays which demonstrate the hemiarthroplasty prosthesis is well-positioned.  The leg lengths appear equivalent.  There is no evidence of postop complication.  Patient will have his Foley catheter removed in the morning.  Labs will be rechecked in the a.m.  Patient will begin physical therapy tomorrow.  He is weightbearing as tolerated with posterior hip precautions on the left side.  Patient will begin Lovenox for DVT prophylaxis tomorrow.  Will complete 24 as of postop antibiotics.  Patient understood and agreed with plan.  I answered all his questions today.    Thornton Park , MD 10/07/2022, 5:26 PM

## 2022-10-07 NOTE — Assessment & Plan Note (Signed)
On admission, in setting of pain. -IV hydralazine as needed  -was started amlodipine 5 mg on 3/9, stopped 3/10 due to orthostatic dizziness -resume daily medication if consistently > 140/90 with pain adequately controlled -Monitor BP

## 2022-10-08 DIAGNOSIS — R42 Dizziness and giddiness: Secondary | ICD-10-CM

## 2022-10-08 DIAGNOSIS — D72829 Elevated white blood cell count, unspecified: Secondary | ICD-10-CM | POA: Diagnosis present

## 2022-10-08 HISTORY — DX: Dizziness and giddiness: R42

## 2022-10-08 LAB — CBC
HCT: 33.4 % — ABNORMAL LOW (ref 39.0–52.0)
HCT: 34.5 % — ABNORMAL LOW (ref 39.0–52.0)
Hemoglobin: 10.7 g/dL — ABNORMAL LOW (ref 13.0–17.0)
Hemoglobin: 11.1 g/dL — ABNORMAL LOW (ref 13.0–17.0)
MCH: 28.5 pg (ref 26.0–34.0)
MCH: 28.8 pg (ref 26.0–34.0)
MCHC: 32 g/dL (ref 30.0–36.0)
MCHC: 32.2 g/dL (ref 30.0–36.0)
MCV: 89.1 fL (ref 80.0–100.0)
MCV: 89.4 fL (ref 80.0–100.0)
Platelets: 277 10*3/uL (ref 150–400)
Platelets: 304 10*3/uL (ref 150–400)
RBC: 3.75 MIL/uL — ABNORMAL LOW (ref 4.22–5.81)
RBC: 3.86 MIL/uL — ABNORMAL LOW (ref 4.22–5.81)
RDW: 13.8 % (ref 11.5–15.5)
RDW: 13.9 % (ref 11.5–15.5)
WBC: 14.8 10*3/uL — ABNORMAL HIGH (ref 4.0–10.5)
WBC: 17.2 10*3/uL — ABNORMAL HIGH (ref 4.0–10.5)
nRBC: 0 % (ref 0.0–0.2)
nRBC: 0 % (ref 0.0–0.2)

## 2022-10-08 LAB — BASIC METABOLIC PANEL
Anion gap: 7 (ref 5–15)
BUN: 13 mg/dL (ref 8–23)
CO2: 23 mmol/L (ref 22–32)
Calcium: 8.3 mg/dL — ABNORMAL LOW (ref 8.9–10.3)
Chloride: 107 mmol/L (ref 98–111)
Creatinine, Ser: 0.9 mg/dL (ref 0.61–1.24)
GFR, Estimated: >60 mL/min (ref >60)
Glucose, Bld: 111 mg/dL — ABNORMAL HIGH (ref 70–99)
Potassium: 3.6 mmol/L (ref 3.5–5.1)
Sodium: 137 mmol/L (ref 135–145)

## 2022-10-08 LAB — PSA: Prostatic Specific Antigen: 1.1 ng/mL (ref 0.00–4.00)

## 2022-10-08 MED ORDER — HYDROMORPHONE HCL 1 MG/ML IJ SOLN
0.5000 mg | INTRAMUSCULAR | Status: DC | PRN
  Administered 2022-10-08: 1 mg via INTRAVENOUS
  Filled 2022-10-08 (×2): qty 1

## 2022-10-08 MED ORDER — AMLODIPINE BESYLATE 5 MG PO TABS
5.0000 mg | ORAL_TABLET | Freq: Every day | ORAL | Status: DC
  Administered 2022-10-08: 5 mg via ORAL
  Filled 2022-10-08: qty 1

## 2022-10-08 NOTE — Plan of Care (Signed)
  Problem: Education: Goal: Knowledge of General Education information will improve Description: Including pain rating scale, medication(s)/side effects and non-pharmacologic comfort measures Outcome: Progressing   Problem: Health Behavior/Discharge Planning: Goal: Ability to manage health-related needs will improve Outcome: Progressing   Problem: Clinical Measurements: Goal: Ability to maintain clinical measurements within normal limits will improve Outcome: Progressing Goal: Will remain free from infection Outcome: Progressing Goal: Diagnostic test results will improve Outcome: Progressing Goal: Respiratory complications will improve Outcome: Progressing Goal: Cardiovascular complication will be avoided Outcome: Progressing   Problem: Activity: Goal: Risk for activity intolerance will decrease Outcome: Progressing   Problem: Nutrition: Goal: Adequate nutrition will be maintained Outcome: Progressing   Problem: Coping: Goal: Level of anxiety will decrease Outcome: Progressing   Problem: Elimination: Goal: Will not experience complications related to bowel motility Outcome: Progressing   Problem: Pain Managment: Goal: General experience of comfort will improve Outcome: Progressing   Problem: Safety: Goal: Ability to remain free from injury will improve Outcome: Progressing   Problem: Skin Integrity: Goal: Risk for impaired skin integrity will decrease Outcome: Progressing   Problem: Education: Goal: Verbalization of understanding the information provided (i.e., activity precautions, restrictions, etc) will improve Outcome: Progressing Goal: Individualized Educational Video(s) Outcome: Progressing   Problem: Activity: Goal: Ability to ambulate and perform ADLs will improve Outcome: Progressing   Problem: Clinical Measurements: Goal: Postoperative complications will be avoided or minimized Outcome: Progressing   Problem: Self-Concept: Goal: Ability to  maintain and perform role responsibilities to the fullest extent possible will improve Outcome: Progressing   Problem: Pain Management: Goal: Pain level will decrease Outcome: Progressing   

## 2022-10-08 NOTE — Assessment & Plan Note (Signed)
WBC 10.9 >> 14.8 on post-op day one.  Likely reactive. Monitor CBC and for s/sx's of infection.

## 2022-10-08 NOTE — Progress Notes (Signed)
Subjective:  POD #1 s/p left hip hemiarthroplasty.   Patient reports left hip pain as mild to moderate.  Patient sitting up in a chair and out of bed today.  His son and a friend are in the room.  Objective:   VITALS:   Vitals:   10/08/22 0031 10/08/22 0130 10/08/22 0536 10/08/22 0843  BP: (!) 173/104 (!) 151/95 (!) 144/83 (!) 141/87  Pulse: 85  89 87  Resp: '16  16 18  '$ Temp: 98.2 F (36.8 C)  98.4 F (36.9 C) 98.2 F (36.8 C)  TempSrc:      SpO2: 97%  98% 98%  Weight:      Height:        PHYSICAL EXAM: Left lower extremity Neurovascular intact Sensation intact distally Intact pulses distally Dorsiflexion/Plantar flexion intact Incision: dressing C/D/I No cellulitis present Compartment soft  LABS  Results for orders placed or performed during the hospital encounter of 10/06/22 (from the past 24 hour(s))  PSA     Status: None   Collection Time: 10/08/22  4:11 AM  Result Value Ref Range   Prostatic Specific Antigen 1.10 0.00 - 4.00 ng/mL  CBC     Status: Abnormal   Collection Time: 10/08/22  4:11 AM  Result Value Ref Range   WBC 14.8 (H) 4.0 - 10.5 K/uL   RBC 3.75 (L) 4.22 - 5.81 MIL/uL   Hemoglobin 10.7 (L) 13.0 - 17.0 g/dL   HCT 33.4 (L) 39.0 - 52.0 %   MCV 89.1 80.0 - 100.0 fL   MCH 28.5 26.0 - 34.0 pg   MCHC 32.0 30.0 - 36.0 g/dL   RDW 13.8 11.5 - 15.5 %   Platelets 277 150 - 400 K/uL   nRBC 0.0 0.0 - 0.2 %  Basic metabolic panel     Status: Abnormal   Collection Time: 10/08/22  4:11 AM  Result Value Ref Range   Sodium 137 135 - 145 mmol/L   Potassium 3.6 3.5 - 5.1 mmol/L   Chloride 107 98 - 111 mmol/L   CO2 23 22 - 32 mmol/L   Glucose, Bld 111 (H) 70 - 99 mg/dL   BUN 13 8 - 23 mg/dL   Creatinine, Ser 0.90 0.61 - 1.24 mg/dL   Calcium 8.3 (L) 8.9 - 10.3 mg/dL   GFR, Estimated >60 >60 mL/min   Anion gap 7 5 - 15    DG Hip Port Unilat With Pelvis 1V Left  Result Date: 10/07/2022 CLINICAL DATA:  Hip fracture. EXAM: DG HIP (WITH OR WITHOUT PELVIS) 1V  PORT LEFT COMPARISON:  10/06/2022 FINDINGS: Patient has had interval LEFT hip hemiarthroplasty. Hardware is well seated. No interval fractures. Two sclerotic lesions are again seen in the RIGHT iliac bone, raising the question of metastatic disease. IMPRESSION: 1. Status post LEFT hip hemiarthroplasty. 2. Sclerotic lesions overlie the RIGHT iliac bone, raising the question of metastatic disease. Recommend correlation with history. Electronically Signed   By: Nolon Nations M.D.   On: 10/07/2022 14:48   ECHOCARDIOGRAM COMPLETE  Result Date: 10/07/2022    ECHOCARDIOGRAM REPORT   Patient Name:   Christian Hughes Date of Exam: 10/07/2022 Medical Rec #:  [id number]      Height:       72.0 in Accession #:    [id number]     Weight:       220.0 lb Date of Birth:  01/02/1961       BSA:          2.219  m Patient Age:    62 years       BP:           196/120 mmHg Patient Gender: M              HR:           73 bpm. Exam Location:  [facility] Procedure: 2D Echo Indications:     Preoperative Evaluation  History:         Patient has no prior history of Echocardiogram examinations.  Sonographer:     Kathlen Brunswick RDCS Referring Phys:  [place] Diagnosing Phys: Ida Rogue MD  Sonographer Comments: Suboptimal parasternal window and no subcostal window. Image acquisition challenging due to patient body habitus. IMPRESSIONS  1. Left ventricular ejection fraction, by estimation, is 55 to 60%. The left ventricle has normal function. The left ventricle has no regional wall motion abnormalities. There is moderate left ventricular hypertrophy. Left ventricular diastolic parameters are consistent with Grade I diastolic dysfunction (impaired relaxation).  2. Right ventricular systolic function is normal. The right ventricular size is normal.  3. The mitral valve is normal in structure. No evidence of mitral valve regurgitation. No evidence of mitral stenosis.  4. The aortic valve has an indeterminant number of cusps, unable to  exclude bicuspid aortic valve. Aortic valve regurgitation is not visualized. Aortic valve sclerosis/calcification is present, without any evidence of aortic stenosis.  5. The inferior vena cava is normal in size with greater than 50% respiratory variability, suggesting right atrial pressure of 3 mmHg. FINDINGS  Left Ventricle: Left ventricular ejection fraction, by estimation, is 55 to 60%. The left ventricle has normal function. The left ventricle has no regional wall motion abnormalities. The left ventricular internal cavity size was normal in size. There is  moderate left ventricular hypertrophy. Left ventricular diastolic parameters are consistent with Grade I diastolic dysfunction (impaired relaxation). Right Ventricle: The right ventricular size is normal. No increase in right ventricular wall thickness. Right ventricular systolic function is normal. Left Atrium: Left atrial size was normal in size. Right Atrium: Right atrial size was normal in size. Pericardium: There is no evidence of pericardial effusion. Mitral Valve: The mitral valve is normal in structure. Mild mitral annular calcification. No evidence of mitral valve regurgitation. No evidence of mitral valve stenosis. Tricuspid Valve: The tricuspid valve is normal in structure. Tricuspid valve regurgitation is trivial. No evidence of tricuspid stenosis. Aortic Valve: The aortic valve has an indeterminant number of cusps. Aortic valve regurgitation is not visualized. Aortic valve sclerosis/calcification is present, without any evidence of aortic stenosis. Aortic valve peak gradient measures 10.8 mmHg. Pulmonic Valve: The pulmonic valve was normal in structure. Pulmonic valve regurgitation is not visualized. No evidence of pulmonic stenosis. Aorta: The aortic root is normal in size and structure. Venous: The inferior vena cava is normal in size with greater than 50% respiratory variability, suggesting right atrial pressure of 3 mmHg. IAS/Shunts: No  atrial level shunt detected by color flow Doppler.  LEFT VENTRICLE PLAX 2D LVIDd:         4.00 cm   Diastology LVIDs:         2.80 cm   LV e' medial:    5.33 cm/s LV PW:         1.80 cm   LV E/e' medial:  12.7 LV IVS:        1.70 cm   LV e' lateral:   8.92 cm/s LVOT diam:     2.40  cm   LV E/e' lateral: 7.6 LV SV:         70 LV SV Index:   31 LVOT Area:     4.52 cm  RIGHT VENTRICLE RV Basal diam:  2.80 cm RV S prime:     15.90 cm/s TAPSE (M-mode): 1.9 cm LEFT ATRIUM             Index        RIGHT ATRIUM           Index LA diam:        4.20 cm 1.89 cm/m   RA Area:     18.90 cm LA Vol (A2C):   52.7 ml 23.75 ml/m  RA Volume:   50.30 ml  22.67 ml/m LA Vol (A4C):   48.8 ml 22.00 ml/m LA Biplane Vol: 56.2 ml 25.33 ml/m  AORTIC VALVE                 PULMONIC VALVE AV Area (Vmax): 2.60 cm     PV Vmax:        0.98 m/s AV Vmax:        164.00 cm/s  PV Peak grad:   3.8 mmHg AV Peak Grad:   10.8 mmHg    RVOT Peak grad: 2 mmHg LVOT Vmax:      94.20 cm/s LVOT Vmean:     65.700 cm/s LVOT VTI:       0.154 m  AORTA Ao Root diam: 3.70 cm Ao Asc diam:  2.90 cm MITRAL VALVE MV Area (PHT): 3.42 cm    SHUNTS MV Decel Time: 222 msec    Systemic VTI:  0.15 m MV E velocity: 67.60 cm/s  Systemic Diam: 2.40 cm MV A velocity: 89.10 cm/s MV E/A ratio:  0.76 Ida Rogue MD Electronically signed by Ida Rogue MD Signature Date/Time: 10/07/2022/10:23:06 AM    Final     Assessment/Plan: 1 Day Post-Op   Principal Problem:   Closed left hip fracture, initial encounter ([place]) Active Problems:   Fall   Abnormal EKG   Normocytic anemia   Bone lesion_sclerotic lesions in the right iliac bone   Elevated blood pressure reading   Leukocytosis   Orthostatic dizziness  Patient doing well postop from an orthopedic standpoint.  Bandage remains clean and dry.  There is no significant swelling or ecchymosis around the left thigh.  She will continue physical and Occupational Therapy.  He is weightbearing as tolerated with posterior hip  precautions on left lower extremity.  Patient has started Lovenox for DVT prophylaxis.  Vital signs and hemoglobin and hematocrit are stable.    Thornton Park , MD 10/08/2022, 2:23 PM

## 2022-10-08 NOTE — Progress Notes (Signed)
Physical Therapy Treatment Patient Details Name: Christian Hughes MRN: [id number] DOB: May 19, 1961 Today's Date: 10/08/2022   History of Present Illness Christian Hughes is an 62 y.o. male who was admitted with a diagnosis of displaced left femoral neck fracture status post fall.  Pt is now S/P L hip hemiarthroplasty. WBAT aand Post Hip precautions.    PT Comments    Pt received in chair without any dizziness and pain with movement. Pt agreeable to PT and wanted to remain in the chair after treatment. PT reviewed posterior hip precautions and THA exs handout. Pt performed all exs from  Post hip handout. Pt advised ot perform them every 3 hours and to maintain hip precautions 100% of the time.  Pt demonstrated good understanding.  Pt Sit to stand with min to mod assist with BP 154/93 in sitting and with standing 121/91 with dizziness. Pt deferred form attempting stairs and ambulation pt comfortable without distress in chair. Nurse made aware of no out put today and BP findings.  PT will continue while in acute BID. PT recommendation TBD based on pt's ability negotiate 5 steps and ambulation without adverse effect on the vitals.   Recommendations for follow up therapy are one component of a multi-disciplinary discharge planning process, led by the attending physician.  Recommendations may be updated based on patient status, additional functional criteria and insurance authorization.  Follow Up Recommendations  Other (comment) (PT recommendations TBD based on pt's progress with BP and stairs.)     Assistance Recommended at Discharge Intermittent Supervision/Assistance  Patient can return home with the following A lot of help with walking and/or transfers;Two people to help with bathing/dressing/bathroom;Assistance with cooking/housework;Assist for transportation;Help with stairs or ramp for entrance   Equipment Recommendations  Rolling walker (2 wheels);BSC/3in1    Recommendations for Other Services        Precautions / Restrictions Precautions Precautions: Fall Restrictions Weight Bearing Restrictions: Yes LLE Weight Bearing: Weight bearing as tolerated     Mobility  Bed Mobility Overal bed mobility: Needs Assistance Bed Mobility: Supine to Sit     Supine to sit: Max assist (to LLE and min ot Upper body)     General bed mobility comments: pt received in chair    Transfers Overall transfer level: Needs assistance Equipment used: Rolling walker (2 wheels) Transfers: Sit to/from Stand Sit to Stand: Min assist   Step pivot transfers: Min assist       General transfer comment: Pt BP dropped 30 mmHG systolic so sat down. Pt preferred ot remain in the chair.    Ambulation/Gait               General Gait Details: Deferred 2/2 unsafe   Stairs: Deferred 2/2 to unsafe             Wheelchair Mobility    Modified Rankin (Stroke Patients Only)       Balance Overall balance assessment: Needs assistance Sitting-balance support: Single extremity supported Sitting balance-Leahy Scale: Good Sitting balance - Comments: afraid of putting LLE down.   Standing balance support: Bilateral upper extremity supported Standing balance-Leahy Scale: Good Standing balance comment: Pt remianed dizzy and therfore at high risk of fall.                            Cognition Arousal/Alertness: Awake/alert Behavior During Therapy: WFL for tasks assessed/performed, Anxious Overall Cognitive Status: Within Functional Limits for tasks assessed  Exercises Total Joint Exercises Ankle Circles/Pumps: AROM, 10 reps, Both, Supine, Seated Quad Sets: Left, 10 reps, Seated Gluteal Sets: Both, 10 reps, Supine, Seated Short Arc Quad: AROM, 10 reps, Seated Heel Slides: AROM, Left, 5 reps, Supine Hip ABduction/ADduction: AROM, 5 reps, Seated Long Arc Quad: AROM, Left, 10 reps, Seated    General Comments         Pertinent Vitals/Pain Pain Assessment Pain Assessment: 0-10 Pain Score: 5  Breathing: normal Facial Expression: smiling or inexpressive Body Language: tense, distressed pacing, fidgeting Pain Location: L hip Pain Descriptors / Indicators: Aching, Constant Pain Intervention(s): Patient requesting pain meds-RN notified    Home Living Family/patient expects to be discharged to:: Private residence Living Arrangements: Children (adult Son is staying to help out.) Available Help at Discharge: Family Type of Home: Mobile home Home Access: Stairs to enter Entrance Stairs-Rails: Can reach both Entrance Stairs-Number of Steps: 4   Home Layout: One level Home Equipment: None Additional Comments: has grab bars in shower.    Prior Function            PT Goals (current goals can now be found in the care plan section) Acute Rehab PT Goals Patient Stated Goal: " I want to go home safely." PT Goal Formulation: With patient Time For Goal Achievement: 10/22/22 Potential to Achieve Goals: Good Progress towards PT goals: Progressing toward goals    Frequency    BID      PT Plan      Co-evaluation              AM-PAC PT "6 Clicks" Mobility   Outcome Measure  Help needed turning from your back to your side while in a flat bed without using bedrails?: A Lot Help needed moving from lying on your back to sitting on the side of a flat bed without using bedrails?: A Lot Help needed moving to and from a bed to a chair (including a wheelchair)?: A Little Help needed standing up from a chair using your arms (e.g., wheelchair or bedside chair)?: A Little Help needed to walk in hospital room?: A Lot Help needed climbing 3-5 steps with a railing? : Total 6 Click Score: 13    End of Session Equipment Utilized During Treatment: Gait belt Activity Tolerance: Patient tolerated treatment well;Treatment limited secondary to medical complications (Comment) Patient left: in chair;with  call bell/phone within reach;with chair alarm set Nurse Communication: Mobility status;Weight bearing status PT Visit Diagnosis: Other abnormalities of gait and mobility (R26.89);Muscle weakness (generalized) (M62.81);Difficulty in walking, not elsewhere classified (R26.2);Dizziness and giddiness (R42)     Time: [id number] PT Time Calculation (min) (ACUTE ONLY): 17 min  Charges:  $Therapeutic Exercise: 8-22 mins $Therapeutic Activity: 8-22 mins                    Dayanara Sherrill PT DPT 1:45 PM,10/08/22

## 2022-10-08 NOTE — Progress Notes (Signed)
OT Cancellation Note  Patient Details Name: FREDERIK HARTSOCK MRN: [id number] DOB: 13-Feb-1961   Cancelled Treatment:    Reason Eval/Treat Not Completed: Medical issues which prohibited therapy (pt orthostatics when up with PT earlier today (in both PT sessions); will hold until tomorrow)  Vania Rea 10/08/2022, 1:53 PM

## 2022-10-08 NOTE — Progress Notes (Signed)
Progress Note   Patient: Christian Hughes [id number] DOB: 04-06-1961 DOA: 10/06/2022     2 DOS: the patient was seen and examined on 10/08/2022   Brief hospital course: HPI on admission 3/8, by Dr. Blaine Hamper: "Christian Hughes is a 62 y.o. male without significant past medical history except for occasional vaping amd marijuana use, who presents with fall and left hip pain.      Pt states that he slipped and fell at work at about 11:00 AM.  Denies loss of consciousness.  He injured his left hip, causing left hip pain, which is constant, sharp, severe, nonradiating, aggravated by movement.  No leg numbness.  Patient denies chest pain, cough, shortness breath.  No nausea vomiting, diarrhea or abdominal pain.  No symptoms of UTI.  Patient strongly denies any head or neck injury.  He refused CT scan of head and neck.   Data reviewed independently and ED Course: pt was found to have WBC 10.9, hemoglobin 11.5, GFR> 60, temperature normal, blood pressure 170/102, heart rate 57, RR 18, oxygen saturation 94% on room air.  Chest x-ray showed cardiomegaly and minimal interstitial disease.  X-ray of left hip showed left femoral neck fracture with some sclerotic bone lesion.  Patient is admitted to telemetry bed as inpatient.  Dr. Rockey Situ of card and Dr. Mack Guise of Ortho are consulted.     X-ray of left hip 1. Acute angulated left femoral neck fracture. 2. Two round sclerotic lesions in the right iliac bone are nonspecific. While these could represent bone islands, metastatic disease is not excluded. Correlate for history of malignancy."   3/9 -- patient taken to OR by orthopedic surgery.  Assessment and Plan: * Closed left hip fracture, initial encounter ([place]) Due to mechanical fall.   --Mgmt per orthopedic surgery --status post Left hip hemiarthroplasty on 10/07/22 --Pain control PRN per orders --Robaxin PRN muscle spasm --Bowel regimen --PT/OT evaluations for d/c planning 3/10 -- pt had orthostatic  dizziness which limited ability for full PT evaluation  Fall Resulted in left hip fracture. Of note, patient declined CT head and C-spine on admission. He denies head injury or LOC.  Abnormal EKG EKG with T wave inversion in lateral leads and V4-V6, ST elevation in V1-V2.  Pt denies any chest pain, cough, shortness breath.  Initial troponin 10 -Consulted Dr. Rockey Situ for cardiology for presurgical clearance - granted - Follow pending Echo  Normocytic anemia Mild. Hemoglobin 11.5 on admission, no baseline hemoglobin available --Monitor CBC  Bone lesion_sclerotic lesions in the right iliac bone This is an incidental finding by x-ray which showed two round sclerotic lesions in the right iliac bone, nonspecific. While these could represent bone islands, metastatic disease is not excluded.  -need to give referral to oncology for outpt follow up.  Elevated blood pressure reading On admission, in setting of pain. -IV hydralazine as needed  -started amlodipine 5 mg on 3/9 but now orthostatic with dizziness - stop amlodipine -Monitor BMP  Orthostatic dizziness 3/10 -- pt had dizziness up with PT today, and BP did drop. Started on amlodipine yesterday, will d/c this for now. Suspect BP lower with use of pain meds, previously high due to pain pre-operatively. --Monitor BP closely and check orthostatic vitals daily  Leukocytosis WBC 10.9 >> 14.8 on post-op day one.  Likely reactive. Monitor CBC and for s/sx's of infection.        Subjective: Pt awake resting in bed when seen this AM.  Reports only mild pain when resting in  bed, has not been up to attempt mobility yet.  Otherwise denies acute complaints.  Wants to go home, not amenable to rehab if it were recommended.    Physical Exam: Vitals:   10/08/22 0031 10/08/22 0130 10/08/22 0536 10/08/22 0843  BP: (!) 173/104 (!) 151/95 (!) 144/83 (!) 141/87  Pulse: 85  89 87  Resp: '16  16 18  '$ Temp: 98.2 F (36.8 C)  98.4 F (36.9 C) 98.2  F (36.8 C)  TempSrc:      SpO2: 97%  98% 98%  Weight:      Height:       General exam: awake, alert, no acute distress HEENT: moist mucus membranes, hearing grossly normal  Respiratory system: lungs clear, on room air, normal respiratory effort. Cardiovascular system: normal S1/S2, RRR, no pedal edema.   Gastrointestinal system: soft, NT, ND Central nervous system: A&O x 4. no gross focal neurologic deficits, normal speech Extremities: no edema, normal tone Skin: dry, intact, normal temperature Psychiatry: normal mood, congruent affect, judgement and insight appear normal   Data Reviewed:  Notable labs --- WBC 10.9 >> 14.8, Hbg 10.7 from 11.5  Family Communication: at bedside on rounds  Disposition: Status is: Inpatient Remains inpatient appropriate because: orthostatic dizziness today interferes with adequate PT evaluation including navigation of stairs.  Anticipate d/c in 24-48 hours if this is improved and pain controlled.    Planned Discharge Destination: Home with Home Health     Time spent: 40 minutes  Author: Ezekiel Slocumb, DO 10/08/2022 1:16 PM  For on call review [email or web address].

## 2022-10-08 NOTE — Progress Notes (Signed)
Bladder scan showed 529 ml. Patient reports feeling uncomfortable and unable to urinate. DO Arbutus Ped was notified and orders were placed. Urinary in and out cath resulted in an output of 600 mL of amber colored urine. Peri care done. Patient reports relief of discomfort.

## 2022-10-08 NOTE — Assessment & Plan Note (Addendum)
3/10 -- pt had dizziness up with PT today, and BP did drop. Started on amlodipine yesterday, will d/c this for now. Suspect BP lower with use of pain meds, previously high due to pain pre-operatively. --Monitor BP closely and check orthostatic vitals daily

## 2022-10-08 NOTE — Evaluation (Signed)
Physical Therapy Evaluation Patient Details Name: Christian Hughes MRN: [id number] DOB: 06-Mar-1961 Today's Date: 10/08/2022  History of Present Illness  Christian Hughes is an 62 y.o. male who was admitted with a diagnosis of displaced left femoral neck fracture status post fall.  Pt is now S/P L hip hemiarthroplasty. WBAT aand Post Hip precautions.  Clinical Impression  Pt received in Bed eager to get OOB with PT. Pt with pain and remained dizzy  with all activities.  Pt PLOF included working full time, driving and Ind with ADLs and IADLs. Pt desires to return home  with his Son who is planning ot stay with him to help. PT Assessment revealed pt is anxious and remained symptomatic with supine to sit and from STS. Supine BP 126/70, sit 113/70 and with standing 103/64. Pt transfers to chair with Min assist heavily dependent on the [place]. Pt unsafe to ambulate or attempt stairs during morning session. PT plans to attend stairs in the PM if pt is appropriate. Pt tol treatment well bu t limited due to symptomatic drop in BP. PT recommendation TBD based on pt's progress with BP/ symptoms, functional mobility, steps negotiation in order for a safe discharge planning.    Recommendations for follow up therapy are one component of a multi-disciplinary discharge planning process, led by the attending physician.  Recommendations may be updated based on patient status, additional functional criteria and insurance authorization.  Follow Up Recommendations Other (comment) (PT recommendations TBD nased on pt's progress with BP and stairs.)      Assistance Recommended at Discharge Intermittent Supervision/Assistance  Patient can return home with the following  A lot of help with walking and/or transfers;A lot of help with bathing/dressing/bathroom;Assistance with cooking/housework;Assist for transportation;Help with stairs or ramp for entrance    Equipment Recommendations Rolling walker (2 wheels);BSC/3in1   Recommendations for Other Services       Functional Status Assessment Patient has had a recent decline in their functional status and demonstrates the ability to make significant improvements in function in a reasonable and predictable amount of time.     Precautions / Restrictions Precautions Precautions: Fall Restrictions Weight Bearing Restrictions: Yes LLE Weight Bearing: Weight bearing as tolerated      Mobility  Bed Mobility Overal bed mobility: Needs Assistance Bed Mobility: Supine to Sit     Supine to sit: Max assist (to LLE and min ot Upper body)          Transfers Overall transfer level: Needs assistance Equipment used: Rolling walker (2 wheels) Transfers: Sit to/from Stand, Bed to chair/wheelchair/BSC Sit to Stand: Min assist   Step pivot transfers: Min assist       General transfer comment: pt anxious and dizzy through out the transfer. BP 126/70 in supine  103/64 in standing.    Ambulation/Gait               General Gait Details: Deferred 2/2 unsafe  Stairs: Unsafe 2/2 to BP values            Wheelchair Mobility    Modified Rankin (Stroke Patients Only)       Balance Overall balance assessment: Needs assistance Sitting-balance support: Single extremity supported Sitting balance-Leahy Scale: Good Sitting balance - Comments: afraid ot putting LLE down.   Standing balance support: Bilateral upper extremity supported Standing balance-Leahy Scale: Good Standing balance comment: Pt remianed dizzy and therfore at high risk of fall.  Pertinent Vitals/Pain Pain Assessment Pain Assessment: 0-10 Pain Score: 7  Breathing: normal Facial Expression: smiling or inexpressive Body Language: tense, distressed pacing, fidgeting Pain Location: L hip Pain Descriptors / Indicators: Aching, Constant Pain Intervention(s): Premedicated before session    Home Living Family/patient expects to be  discharged to:: Private residence Living Arrangements: Children (adult Son is staying to help out.) Available Help at Discharge: Family Type of Home: Mobile home Home Access: Stairs to enter Entrance Stairs-Rails: Can reach both Entrance Stairs-Number of Steps: 4   Home Layout: One level Home Equipment: None Additional Comments: has grab bars in shower.    Prior Function Prior Level of Function : Independent/Modified Independent;Working/employed;Driving             Mobility Comments: Pt Independent with cooking, cleaning, and ambulation without AD at household level and commiunity level  activity participation. ADLs Comments: Ind with all ADLs and IADls.     Hand Dominance   Dominant Hand: Right    Extremity/Trunk Assessment   Upper Extremity Assessment Upper Extremity Assessment: Overall WFL for tasks assessed    Lower Extremity Assessment Lower Extremity Assessment: RLE deficits/detail RLE Deficits / Details: Weakness 2/2 to S/P hip surgery RLE: Unable to fully assess due to pain RLE Sensation: WNL RLE Coordination: WNL    Cervical / Trunk Assessment Cervical / Trunk Assessment: Normal  Communication   Communication: No difficulties  Cognition Arousal/Alertness: Awake/alert Behavior During Therapy: WFL for tasks assessed/performed, Anxious Overall Cognitive Status: Within Functional Limits for tasks assessed                                          General Comments      Exercises Total Joint Exercises Ankle Circles/Pumps: AROM, 10 reps, Both, Supine, Seated Quad Sets: Left, 10 reps, Seated Gluteal Sets: Both, 10 reps, Supine, Seated Heel Slides: AROM, Left, 5 reps, Supine   Assessment/Plan    PT Assessment Patient needs continued PT services  PT Problem List Decreased strength;Decreased range of motion;Decreased activity tolerance;Decreased balance;Decreased mobility;Decreased knowledge of use of DME;Pain;Cardiopulmonary status  limiting activity;Decreased knowledge of precautions       PT Treatment Interventions Gait training;Stair training;Functional mobility training;Therapeutic activities;Therapeutic exercise;Balance training;Patient/family education    PT Goals (Current goals can be found in the Care Plan section)  Acute Rehab PT Goals Patient Stated Goal: " I want to go home safely." PT Goal Formulation: With patient Time For Goal Achievement: 10/22/22 Potential to Achieve Goals: Good    Frequency BID     Co-evaluation               AM-PAC PT "6 Clicks" Mobility  Outcome Measure Help needed turning from your back to your side while in a flat bed without using bedrails?: A Lot Help needed moving from lying on your back to sitting on the side of a flat bed without using bedrails?: A Lot Help needed moving to and from a bed to a chair (including a wheelchair)?: A Little Help needed standing up from a chair using your arms (e.g., wheelchair or bedside chair)?: A Little Help needed to walk in hospital room?: A Lot Help needed climbing 3-5 steps with a railing? : Total 6 Click Score: 13    End of Session Equipment Utilized During Treatment: Gait belt Activity Tolerance: Patient tolerated treatment well;Treatment limited secondary to medical complications (Comment) (limited 2/2 to symptoms.) Patient left:  in chair;with call bell/phone within reach;with chair alarm set Nurse Communication: Mobility status;Weight bearing status PT Visit Diagnosis: Other abnormalities of gait and mobility (R26.89);Muscle weakness (generalized) (M62.81);Difficulty in walking, not elsewhere classified (R26.2);Dizziness and giddiness (R42)    Time: [id number] PT Time Calculation (min) (ACUTE ONLY): 50 min   Charges:   PT Evaluation $PT Eval Low Complexity: 1 Low PT Treatments $Therapeutic Exercise: 8-22 mins $Therapeutic Activity: 8-22 mins       Joaquin Music PT DPT 12:17 PM,10/08/22

## 2022-10-08 NOTE — Progress Notes (Signed)
Bladder scan done at 1439. Volume 185.  Pt has not voided since foley removal at 0530. Pt gets orthostatic and symptomatic when he stands with physical therapy. He c/o dizziness and being light headed

## 2022-10-09 ENCOUNTER — Encounter: Payer: Self-pay | Admitting: Orthopedic Surgery

## 2022-10-09 DIAGNOSIS — R338 Other retention of urine: Secondary | ICD-10-CM | POA: Diagnosis not present

## 2022-10-09 LAB — CBC
HCT: 31.2 % — ABNORMAL LOW (ref 39.0–52.0)
Hemoglobin: 10.1 g/dL — ABNORMAL LOW (ref 13.0–17.0)
MCH: 28.7 pg (ref 26.0–34.0)
MCHC: 32.4 g/dL (ref 30.0–36.0)
MCV: 88.6 fL (ref 80.0–100.0)
Platelets: 274 10*3/uL (ref 150–400)
RBC: 3.52 MIL/uL — ABNORMAL LOW (ref 4.22–5.81)
RDW: 13.9 % (ref 11.5–15.5)
WBC: 12.8 10*3/uL — ABNORMAL HIGH (ref 4.0–10.5)
nRBC: 0 % (ref 0.0–0.2)

## 2022-10-09 NOTE — Progress Notes (Signed)
Physical Therapy Treatment Patient Details Name: Christian Hughes MRN: [id number] DOB: 12/23/1960 Today's Date: 10/09/2022   History of Present Illness Christian Hughes is an 62 y.o. male who was admitted with a diagnosis of displaced left femoral neck fracture status post fall.  Pt is now S/P L hip hemiarthroplasty, WBAT with Post Hip precautions.    PT Comments    Pt was pleasant and motivated to participate during the session and put forth good effort throughout. Pt continued to require cuing for compliance with post hip precautions with gait and transfers but required grossly less physical assistance this session and presented with much improved activity tolerance.  Pt was generally steady with amb and reported no adverse symptoms other than min to mod L hip pain with weight bearing.  Pt will benefit from HHPT upon discharge to safely address deficits listed in patient problem list for decreased caregiver assistance and eventual return to PLOF.    Recommendations for follow up therapy are one component of a multi-disciplinary discharge planning process, led by the attending physician.  Recommendations may be updated based on patient status, additional functional criteria and insurance authorization.  Follow Up Recommendations  Home health PT     Assistance Recommended at Discharge Intermittent Supervision/Assistance  Patient can return home with the following A lot of help with walking and/or transfers;Assistance with cooking/housework;Assist for transportation;Help with stairs or ramp for entrance;A lot of help with bathing/dressing/bathroom   Equipment Recommendations  None recommended by PT (Pt reported having access to [facility] and [facility]W, no DME needs)    Recommendations for Other Services       Precautions / Restrictions Precautions Precautions: Fall;Posterior Hip Precaution Booklet Issued: Yes (comment) Restrictions Weight Bearing Restrictions: Yes LLE Weight Bearing: Weight bearing as  tolerated     Mobility  Bed Mobility               General bed mobility comments: NT, received/left in recliner    Transfers Overall transfer level: Needs assistance Equipment used: Rolling walker (2 wheels) Transfers: Sit to/from Stand Sit to Stand: Min guard           General transfer comment: Mod verbal and visual cues for sequencing for hip prec compliance    Ambulation/Gait Ambulation/Gait assistance: Min guard Gait Distance (Feet): 80 Feet Assistive device: Rolling walker (2 wheels) Gait Pattern/deviations: Step-to pattern, Decreased step length - right, Decreased stance time - left, Antalgic Gait velocity: decreased     General Gait Details: Slow cadence with step-to pattern, antalgic on the LLE but generally steady with no overt LOB, improved activity tolerance compared to prior session   Stairs             Wheelchair Mobility    Modified Rankin (Stroke Patients Only)       Balance Overall balance assessment: Needs assistance Sitting-balance support: Single extremity supported Sitting balance-Leahy Scale: Good     Standing balance support: Bilateral upper extremity supported, During functional activity, Reliant on assistive device for balance Standing balance-Leahy Scale: Fair                              Cognition Arousal/Alertness: Awake/alert Behavior During Therapy: WFL for tasks assessed/performed Overall Cognitive Status: Within Functional Limits for tasks assessed  Exercises Total Joint Exercises Ankle Circles/Pumps: AROM, Strengthening, Both, 10 reps Quad Sets: Strengthening, Both, 10 reps Long Arc Quad: 10 reps, Strengthening, Both Knee Flexion: Strengthening, Both, 10 reps Marching in Standing: Strengthening, Both, 5 reps, Standing Other Exercises Other Exercises: Car transfer sequencing education and review Other Exercises: Posterior hip precaution  education/review Other Exercises: 90 deg L turn training to prevent CKC L hip IR    General Comments        Pertinent Vitals/Pain Pain Assessment Pain Assessment: 0-10 Pain Score: 4  Pain Location: L hip Pain Descriptors / Indicators: Aching, Sore Pain Intervention(s): Repositioned, Premedicated before session, Monitored during session    Home Living Family/patient expects to be discharged to:: Private residence Living Arrangements: Children (per pt, adult son will be staying with him upon D/C) Available Help at Discharge: Family;Available 24 hours/day Type of Home: Mobile home Home Access: Stairs to enter Entrance Stairs-Rails: Can reach both Entrance Stairs-Number of Steps: 4   Home Layout: One level Home Equipment: Grab bars - tub/shower      Prior Function            PT Goals (current goals can now be found in the care plan section) Progress towards PT goals: Progressing toward goals    Frequency    BID      PT Plan Current plan remains appropriate    Co-evaluation              AM-PAC PT "6 Clicks" Mobility   Outcome Measure  Help needed turning from your back to your side while in a flat bed without using bedrails?: A Lot Help needed moving from lying on your back to sitting on the side of a flat bed without using bedrails?: A Lot Help needed moving to and from a bed to a chair (including a wheelchair)?: A Lot Help needed standing up from a chair using your arms (e.g., wheelchair or bedside chair)?: A Little Help needed to walk in hospital room?: A Little Help needed climbing 3-5 steps with a railing? : A Little 6 Click Score: 15    End of Session Equipment Utilized During Treatment: Gait belt Activity Tolerance: Patient tolerated treatment well Patient left: in chair;with call bell/phone within reach;with SCD's reapplied Nurse Communication: Mobility status;Weight bearing status PT Visit Diagnosis: Other abnormalities of gait and mobility  (R26.89);Muscle weakness (generalized) (M62.81);Difficulty in walking, not elsewhere classified (R26.2);Dizziness and giddiness (R42)     Time: [id number] PT Time Calculation (min) (ACUTE ONLY): 24 min  Charges:  $Gait Training: 8-22 mins $Therapeutic Activity: 8-22 mins                    D. Scott Raysa Bosak PT, DPT 10/09/22, 3:35 PM

## 2022-10-09 NOTE — Progress Notes (Signed)
Physical Therapy Treatment Patient Details Name: Christian Hughes MRN: [id number] DOB: [birth year] Today's Date: 10/09/2022   History of Present Illness Christian Hughes is an 62 y.o. male who was admitted with a diagnosis of displaced left femoral neck fracture status post fall.  Pt is now S/P L hip hemiarthroplasty, WBAT with Post Hip precautions.    PT Comments    Pt was pleasant and motivated to participate during the session and put forth good effort throughout. Vitals taken at onset of session as follows. Sitting: 141/75 HR 91, Standing 0 min: 120/79 HR 98, Standing 3 min: 129/74 HR 91. Asymptomatic throughout, MD updated.  Pt required min A and cues for sequencing with transfers and was quite antalgic during gait training but generally steady with no overt LOB.  Pt generally limited by pain during this session.  Will attempt to progress gait as tolerated during PM session with pt potentially hoping to discharge to mother's home where there are no steps to enter but will confirm with pt and incorporate stair training into future session if needed and as appropriate.  Pt will benefit from HHPT upon discharge to safely address deficits listed in patient problem list for decreased caregiver assistance and eventual return to PLOF.       Recommendations for follow up therapy are one component of a multi-disciplinary discharge planning process, led by the attending physician.  Recommendations may be updated based on patient status, additional functional criteria and insurance authorization.  Follow Up Recommendations  Home health PT     Assistance Recommended at Discharge Intermittent Supervision/Assistance  Patient can return home with the following A lot of help with walking and/or transfers;Assistance with cooking/housework;Assist for transportation;Help with stairs or ramp for entrance;A lot of help with bathing/dressing/bathroom   Equipment Recommendations  Rolling walker (2 wheels);BSC/3in1     Recommendations for Other Services       Precautions / Restrictions Precautions Precautions: Fall;Posterior Hip Precaution Booklet Issued: Yes (comment) Restrictions Weight Bearing Restrictions: Yes LLE Weight Bearing: Weight bearing as tolerated     Mobility  Bed Mobility               General bed mobility comments: NT, found in recliner    Transfers Overall transfer level: Needs assistance Equipment used: Rolling walker (2 wheels) Transfers: Sit to/from Stand Sit to Stand: Min assist           General transfer comment: Mod verbal and visual cues for sequencing for hip prec compliance    Ambulation/Gait Ambulation/Gait assistance: Min guard Gait Distance (Feet): 20 Feet Assistive device: Rolling walker (2 wheels) Gait Pattern/deviations: Step-to pattern, Decreased step length - right, Decreased stance time - left, Antalgic Gait velocity: decreased     General Gait Details: Slow cadence with step-to pattern, antalgic on the LLE but generally steady with no overt LOB   Stairs             Wheelchair Mobility    Modified Rankin (Stroke Patients Only)       Balance Overall balance assessment: Needs assistance Sitting-balance support: Single extremity supported Sitting balance-Leahy Scale: Good     Standing balance support: Bilateral upper extremity supported, During functional activity, Reliant on assistive device for balance Standing balance-Leahy Scale: Fair                              Cognition Arousal/Alertness: Awake/alert Behavior During Therapy: WFL for tasks assessed/performed, Impulsive Overall Cognitive  Status: Within Functional Limits for tasks assessed                                          Exercises Total Joint Exercises Ankle Circles/Pumps: AROM, Strengthening, Both, 10 reps Quad Sets: Strengthening, Both, 10 reps Long Arc Quad: 10 reps, Strengthening, Both Knee Flexion: Strengthening,  Both, 10 reps Marching in Standing: Strengthening, Both, 5 reps, Standing Other Exercises Other Exercises: Standing weight shifting left/right x 10 Other Exercises: Posterior hip precaution education/review Other Exercises: 90 deg L turn training to prevent CKC L hip IR    General Comments        Pertinent Vitals/Pain Pain Assessment Pain Assessment: 0-10 Pain Score: 7  Pain Location: L hip Pain Descriptors / Indicators: Aching, Sore Pain Intervention(s): Repositioned, Premedicated before session, Monitored during session    Home Living                          Prior Function            PT Goals (current goals can now be found in the care plan section) Progress towards PT goals: Progressing toward goals    Frequency    BID      PT Plan Current plan remains appropriate    Co-evaluation              AM-PAC PT "6 Clicks" Mobility   Outcome Measure  Help needed turning from your back to your side while in a flat bed without using bedrails?: A Lot Help needed moving from lying on your back to sitting on the side of a flat bed without using bedrails?: A Lot Help needed moving to and from a bed to a chair (including a wheelchair)?: A Lot Help needed standing up from a chair using your arms (e.g., wheelchair or bedside chair)?: A Lot Help needed to walk in hospital room?: A Little Help needed climbing 3-5 steps with a railing? : A Lot 6 Click Score: 13    End of Session Equipment Utilized During Treatment: Gait belt Activity Tolerance: Patient tolerated treatment well Patient left: in chair;with call bell/phone within reach;with SCD's reapplied Nurse Communication: Mobility status;Weight bearing status PT Visit Diagnosis: Other abnormalities of gait and mobility (R26.89);Muscle weakness (generalized) (M62.81);Difficulty in walking, not elsewhere classified (R26.2);Dizziness and giddiness (R42)     Time: [id number] PT Time Calculation (min) (ACUTE  ONLY): 24 min  Charges:  $Gait Training: 8-22 mins $Therapeutic Activity: 8-22 mins                     D. Scott Larsen Dungan PT, DPT 10/09/22, 12:14 PM

## 2022-10-09 NOTE — Assessment & Plan Note (Addendum)
3/10 - Required in/out cath x 2, unable to void post-op. Suspect due to anesthesia and pain meds. --Continue bladder scans and PRN in/out cath. --Hope to avoid Foley.

## 2022-10-09 NOTE — Progress Notes (Signed)
Subjective:  POD #2 s/p left hip hemiarthroplasty.   Patient reports left hip pain as mild.  Patient is up out of bed to a chair.  Objective:   VITALS:   Vitals:   10/08/22 2347 10/09/22 0712 10/09/22 0733 10/09/22 0800  BP: (!) 167/99 (!) 166/106 124/83 (!) 142/91  Pulse: 90 80 81   Resp: 20 20    Temp: 98 F (36.7 C) 98.3 F (36.8 C)    TempSrc:  Oral    SpO2: 97% 98%    Weight:      Height:        PHYSICAL EXAM: Left lower extremity Neurovascular intact Sensation intact distally Intact pulses distally Dorsiflexion/Plantar flexion intact Incision: dressing C/D/I No cellulitis present Compartment soft  LABS  Results for orders placed or performed during the hospital encounter of 10/06/22 (from the past 24 hour(s))  CBC     Status: Abnormal   Collection Time: 10/08/22  5:56 PM  Result Value Ref Range   WBC 17.2 (H) 4.0 - 10.5 K/uL   RBC 3.86 (L) 4.22 - 5.81 MIL/uL   Hemoglobin 11.1 (L) 13.0 - 17.0 g/dL   HCT 34.5 (L) 39.0 - 52.0 %   MCV 89.4 80.0 - 100.0 fL   MCH 28.8 26.0 - 34.0 pg   MCHC 32.2 30.0 - 36.0 g/dL   RDW 13.9 11.5 - 15.5 %   Platelets 304 150 - 400 K/uL   nRBC 0.0 0.0 - 0.2 %  CBC     Status: Abnormal   Collection Time: 10/09/22  6:00 AM  Result Value Ref Range   WBC 12.8 (H) 4.0 - 10.5 K/uL   RBC 3.52 (L) 4.22 - 5.81 MIL/uL   Hemoglobin 10.1 (L) 13.0 - 17.0 g/dL   HCT 31.2 (L) 39.0 - 52.0 %   MCV 88.6 80.0 - 100.0 fL   MCH 28.7 26.0 - 34.0 pg   MCHC 32.4 30.0 - 36.0 g/dL   RDW 13.9 11.5 - 15.5 %   Platelets 274 150 - 400 K/uL   nRBC 0.0 0.0 - 0.2 %    DG Hip Port Unilat With Pelvis 1V Left  Result Date: 10/07/2022 CLINICAL DATA:  Hip fracture. EXAM: DG HIP (WITH OR WITHOUT PELVIS) 1V PORT LEFT COMPARISON:  10/06/2022 FINDINGS: Patient has had interval LEFT hip hemiarthroplasty. Hardware is well seated. No interval fractures. Two sclerotic lesions are again seen in the RIGHT iliac bone, raising the question of metastatic disease.  IMPRESSION: 1. Status post LEFT hip hemiarthroplasty. 2. Sclerotic lesions overlie the RIGHT iliac bone, raising the question of metastatic disease. Recommend correlation with history. Electronically Signed   By: Nolon Nations M.D.   On: 10/07/2022 14:48    Assessment/Plan: 2 Days Post-Op   Principal Problem:   Closed left hip fracture, initial encounter [facility]) Active Problems:   Fall   Abnormal EKG   Normocytic anemia   Bone lesion_sclerotic lesions in the right iliac bone   Elevated blood pressure reading   Leukocytosis   Orthostatic dizziness   Acute urinary retention  Patient is making good progress postop.  He is improving with physical therapy.  He has been recommended for home health physical therapy.  Patient states he will be going to his mother's house upon discharge as she lives in a single level home.  Patient may follow-up with me in 10 to 14 days for staple removal.  Patient should continue enteric-coated aspirin 325 mg p.o. twice daily or Lovenox 40 mg daily  for DVT prophylaxis until follow-up.  Patient is weightbearing as tolerated on left lower extremity but must continue to observe posterior hip precautions for at least the first several months after surgery.   Thornton Park , MD 10/09/2022, 2:28 PM

## 2022-10-09 NOTE — Progress Notes (Signed)
Progress Note   Patient: Christian Hughes [id number] DOB: Apr 12, 1961 DOA: 10/06/2022     3 DOS: the patient was seen and examined on 10/09/2022   Brief hospital course: HPI on admission 3/8, by Dr. Blaine Hamper: "KAHLEB MARIK is a 62 y.o. male without significant past medical history except for occasional vaping amd marijuana use, who presents with fall and left hip pain.      Pt states that he slipped and fell at work at about 11:00 AM.  Denies loss of consciousness.  He injured his left hip, causing left hip pain, which is constant, sharp, severe, nonradiating, aggravated by movement.  No leg numbness.  Patient denies chest pain, cough, shortness breath.  No nausea vomiting, diarrhea or abdominal pain.  No symptoms of UTI.  Patient strongly denies any head or neck injury.  He refused CT scan of head and neck.   Data reviewed independently and ED Course: pt was found to have WBC 10.9, hemoglobin 11.5, GFR> 60, temperature normal, blood pressure 170/102, heart rate 57, RR 18, oxygen saturation 94% on room air.  Chest x-ray showed cardiomegaly and minimal interstitial disease.  X-ray of left hip showed left femoral neck fracture with some sclerotic bone lesion.  Patient is admitted to telemetry bed as inpatient.  Dr. Rockey Situ of card and Dr. Mack Guise of Ortho are consulted.     X-ray of left hip 1. Acute angulated left femoral neck fracture. 2. Two round sclerotic lesions in the right iliac bone are nonspecific. While these could represent bone islands, metastatic disease is not excluded. Correlate for history of malignancy."   3/9 -- patient taken to OR by orthopedic surgery.  Assessment and Plan: * Closed left hip fracture, initial encounter ([place]) Due to mechanical fall.   --Mgmt per orthopedic surgery --status post Left hip hemiarthroplasty on 10/07/22 --Pain control PRN per orders --Robaxin PRN muscle spasm --Bowel regimen --PT/OT evaluations for d/c planning 3/10 -- pt had orthostatic  dizziness which limited ability for full PT evaluation 3/11 -- AM PT assessment limited by uncontrolled pain, dizziness improved  Acute urinary retention 3/10 - Required in/out cath x 2, unable to void post-op. Suspect due to anesthesia and pain meds. --Continue bladder scans and PRN in/out cath. --Hope to avoid Foley.  Orthostatic dizziness 3/10 -- pt had dizziness up with PT today, and BP did drop. 3/11 -- improved with stopping amlodipine Suspect BP lower with use of pain meds, previously high due to pain pre-operatively. --Monitor BP closely and check orthostatic vitals daily  Fall Resulted in left hip fracture. Of note, patient declined CT head and C-spine on admission. He denies head injury or LOC.  Leukocytosis WBC 10.9 >> 14.8 >> 17.2 >> 12.8  Likely reactive in setting of fracture and surgery. Pt afebrile and without s/sx's of infection --Monitor CBC and for s/sx's of infection.  Elevated blood pressure reading On admission, in setting of pain. -IV hydralazine as needed  -was started amlodipine 5 mg on 3/9, stopped 3/10 due to orthostatic dizziness -resume daily medication if consistently > 140/90 with pain adequately controlled -Monitor BP  Bone lesion_sclerotic lesions in the right iliac bone This is an incidental finding by x-ray which showed two round sclerotic lesions in the right iliac bone, nonspecific. While these could represent bone islands, metastatic disease is not excluded.  -need to give referral to oncology for outpt follow up.  Normocytic anemia Mild. Hemoglobin 11.5 on admission, no baseline hemoglobin available --Monitor CBC  Abnormal EKG EKG with  T wave inversion in lateral leads and V4-V6, ST elevation in V1-V2.  Pt denies any chest pain, cough, shortness breath.  Initial troponin 10 -Consulted Dr. Rockey Situ for cardiology for presurgical clearance - granted - Echo 3/9 showed normal EF 55-60%, no WMA's, grade I dd, mod LVH, no significant valvular  disease        Subjective: Pt up in recliner, son at bedside this AM.  Reports doing fairly well with PT earlier but does admit to pain interfering.  Given medication after the session.  He plans to go stay with his mother for recovery, as house is more amenable to using walker.  He denies other complaints at this time.   Physical Exam: Vitals:   10/08/22 2347 10/09/22 0712 10/09/22 0733 10/09/22 0800  BP: (!) 167/99 (!) 166/106 124/83 (!) 142/91  Pulse: 90 80 81   Resp: 20 20    Temp: 98 F (36.7 C) 98.3 F (36.8 C)    TempSrc:  Oral    SpO2: 97% 98%    Weight:      Height:       General exam: awake, alert, no acute distress HEENT: moist mucus membranes, hearing grossly normal  Respiratory system: on room air, normal respiratory effort. Cardiovascular system: RRR, no pedal edema.   Gastrointestinal system: soft, NT, ND Central nervous system: A&O x 4. no gross focal neurologic deficits, normal speech Extremities: no edema, normal tone Skin: dry, intact, normal temperature Psychiatry: normal mood, congruent affect, judgement and insight appear normal   Data Reviewed:  Notable labs --- WBC 12.8 improved, Hbg 10.1  Family Communication: at bedside on rounds  Disposition: Status is: Inpatient Remains inpatient appropriate because: orthostatic dizziness today interferes with adequate PT evaluation including navigation of stairs.  Anticipate d/c in 24-48 hours if this is improved and pain controlled.    Planned Discharge Destination: Home with Home Health     Time spent: 40 minutes  Author: Ezekiel Slocumb, DO 10/09/2022 12:44 PM  For on call review [email or web address].

## 2022-10-09 NOTE — Progress Notes (Signed)
Post 6 hours after I&O, patient still has not voided.  Patient has discomfort.Bladder scan volume is 420m.  Nurse followed protocol and I&O cath the patient again.  I &O volume is 850.  Pericare done, patient reports relief.

## 2022-10-09 NOTE — Evaluation (Signed)
Occupational Therapy Evaluation Patient Details Name: Christian Hughes MRN: [id number] DOB: 1961-01-09 Today's Date: 10/09/2022   History of Present Illness Christian Hughes is an 62 y.o. male who was admitted with a diagnosis of displaced left femoral neck fracture status post fall.  Pt is now S/P L hip hemiarthroplasty, WBAT with Posterior Hip precautions.   Clinical Impression   Patient presenting with decreased independence in self care, balance, functional mobility/transfers, and endurance. PTA pt was independent in ADLs, IADLs, and functional mobility without an AD. Pt was pain limited this date, endorsed 5/10 pain in L hip at rest. RN present to give pain meds during session. OT provided demonstration of LB dressing AE and how to maintain posterior hip precautions during self-care tasks. Pt will benefit from acute OT to increase overall independence in the areas of ADLs and functional mobility in order to facilitate return to PLOF and decrease caregiver burden. Anticipate pt to make quick progress with ADLs once pain is managed and be able to D/C home with HHOT.    Recommendations for follow up therapy are one component of a multi-disciplinary discharge planning process, led by the attending physician.  Recommendations may be updated based on patient status, additional functional criteria and insurance authorization.   Follow Up Recommendations  Home health OT     Assistance Recommended at Discharge Intermittent Supervision/Assistance  Patient can return home with the following A lot of help with bathing/dressing/bathroom;A lot of help with walking and/or transfers;Assistance with cooking/housework;Assist for transportation;Help with stairs or ramp for entrance    Functional Status Assessment  Patient has had a recent decline in their functional status and demonstrates the ability to make significant improvements in function in a reasonable and predictable amount of time.   Equipment  Recommendations  BSC/3in1;Other (comment) (hip kit)    Recommendations for Other Services       Precautions / Restrictions Precautions Precautions: Fall;Posterior Hip Precaution Booklet Issued: Yes (comment) Restrictions Weight Bearing Restrictions: Yes LLE Weight Bearing: Weight bearing as tolerated      Mobility Bed Mobility               General bed mobility comments: NT, received/left in recliner    Transfers          Balance Overall balance assessment: Needs assistance Sitting-balance support: Single extremity supported Sitting balance-Leahy Scale: Good           ADL either performed or assessed with clinical judgement   ADL Overall ADL's : Needs assistance/impaired         General ADL Comments: OT provided demonstration for LB dressing using AE (reacher, sock aid), pt deferred attempting this date despite encouragement 2/2 L hip pain and just transferring to recliner chair. Anticipate at least Min A required for seated LB dressing using AE in order to maintain posterior hip precautions.     Vision Patient Visual Report: No change from baseline       Perception     Praxis      Pertinent Vitals/Pain Pain Assessment Pain Assessment: 0-10 Pain Score: 5  Pain Location: L hip Pain Descriptors / Indicators: Aching, Sore Pain Intervention(s): Limited activity within patient's tolerance, Monitored during session, RN gave pain meds during session     Hand Dominance Right   Extremity/Trunk Assessment Upper Extremity Assessment Upper Extremity Assessment: Overall WFL for tasks assessed   Lower Extremity Assessment Lower Extremity Assessment: LLE deficits/detail LLE Deficits / Details: s/p L hip hemiarthroplasty   Cervical / Trunk  Assessment Cervical / Trunk Assessment: Normal   Communication Communication Communication: No difficulties   Cognition Arousal/Alertness: Awake/alert Behavior During Therapy: WFL for tasks  assessed/performed Overall Cognitive Status: Within Functional Limits for tasks assessed         General Comments       Exercises Other Exercises Other Exercises: OT provided education re: role of OT, OT POC, post acute recs, sitting up for all meals, EOB/OOB mobility with assistance, home/fall safety, LLE WBAT, LB dressing AE, posterior hip precautions (handout provided)   Shoulder Instructions      Home Living Family/patient expects to be discharged to:: Private residence Living Arrangements: Children (per pt, adult son will be staying with him upon D/C) Available Help at Discharge: Family;Available 24 hours/day Type of Home: Mobile home Home Access: Stairs to enter Entrance Stairs-Number of Steps: 4 Entrance Stairs-Rails: Can reach both Home Layout: One level     Bathroom Shower/Tub: Occupational psychologist: Standard Bathroom Accessibility: Yes   Home Equipment: Grab bars - tub/shower          Prior Functioning/Environment Prior Level of Function : Independent/Modified Independent;Working/employed;Driving             Mobility Comments: IND household and community ambulation without an AD ADLs Comments: IND with ADLs/IADLs. Was working full time, driving.        OT Problem List: Decreased strength;Decreased range of motion;Decreased activity tolerance;Impaired balance (sitting and/or standing);Decreased knowledge of use of DME or AE;Decreased knowledge of precautions;Pain      OT Treatment/Interventions: Self-care/ADL training;Therapeutic exercise;Neuromuscular education;Energy conservation;DME and/or AE instruction;Manual therapy;Modalities;Balance training;Patient/family education;Visual/perceptual remediation/compensation;Cognitive remediation/compensation;Therapeutic activities;Splinting    OT Goals(Current goals can be found in the care plan section) Acute Rehab OT Goals Patient Stated Goal: return home OT Goal Formulation: With patient Time  For Goal Achievement: 10/23/22 Potential to Achieve Goals: Good ADL Goals Pt Will Perform Lower Body Dressing: with modified independence;with adaptive equipment;sit to/from stand (while adhering to posterior hip precautions) Pt Will Transfer to Toilet: with modified independence;ambulating Pt Will Perform Toileting - Clothing Manipulation and hygiene: with modified independence;with adaptive equipment;sit to/from stand (while adhering to posterior hip precautions)   OT Frequency: Min 2X/week    Co-evaluation              AM-PAC OT "6 Clicks" Daily Activity     Outcome Measure Help from another person eating meals?: None Help from another person taking care of personal grooming?: None Help from another person toileting, which includes using toliet, bedpan, or urinal?: A Lot Help from another person bathing (including washing, rinsing, drying)?: A Lot Help from another person to put on and taking off regular upper body clothing?: None Help from another person to put on and taking off regular lower body clothing?: A Lot 6 Click Score: 18   End of Session Nurse Communication: Mobility status;Precautions;Weight bearing status  Activity Tolerance: Patient limited by pain;Patient tolerated treatment well Patient left: in chair;with call bell/phone within reach;with chair alarm set  OT Visit Diagnosis: Pain;Muscle weakness (generalized) (M62.81);Other abnormalities of gait and mobility (R26.89) Pain - Right/Left: Left Pain - part of body: Hip                Time: [id number] OT Time Calculation (min): 13 min Charges:  OT General Charges $OT Visit: 1 Visit OT Evaluation $OT Eval Low Complexity: 1 Low  [facility] MS, OTR/L ascom [phone]  10/09/22, 1:10 PM

## 2022-10-10 ENCOUNTER — Other Ambulatory Visit: Payer: Self-pay

## 2022-10-10 ENCOUNTER — Encounter: Payer: Self-pay | Admitting: Internal Medicine

## 2022-10-10 LAB — CBC
HCT: 30.8 % — ABNORMAL LOW (ref 39.0–52.0)
Hemoglobin: 9.9 g/dL — ABNORMAL LOW (ref 13.0–17.0)
MCH: 28.6 pg (ref 26.0–34.0)
MCHC: 32.1 g/dL (ref 30.0–36.0)
MCV: 89 fL (ref 80.0–100.0)
Platelets: 278 10*3/uL (ref 150–400)
RBC: 3.46 MIL/uL — ABNORMAL LOW (ref 4.22–5.81)
RDW: 13.7 % (ref 11.5–15.5)
WBC: 11.7 10*3/uL — ABNORMAL HIGH (ref 4.0–10.5)
nRBC: 0 % (ref 0.0–0.2)

## 2022-10-10 LAB — SURGICAL PATHOLOGY

## 2022-10-10 LAB — BASIC METABOLIC PANEL
Anion gap: 6 (ref 5–15)
BUN: 17 mg/dL (ref 8–23)
CO2: 26 mmol/L (ref 22–32)
Calcium: 8.5 mg/dL — ABNORMAL LOW (ref 8.9–10.3)
Chloride: 102 mmol/L (ref 98–111)
Creatinine, Ser: 0.85 mg/dL (ref 0.61–1.24)
GFR, Estimated: >60 mL/min (ref >60)
Glucose, Bld: 105 mg/dL — ABNORMAL HIGH (ref 70–99)
Potassium: 4.1 mmol/L (ref 3.5–5.1)
Sodium: 134 mmol/L — ABNORMAL LOW (ref 135–145)

## 2022-10-10 MED ORDER — ASPIRIN 325 MG PO TBEC
325.0000 mg | DELAYED_RELEASE_TABLET | Freq: Two times a day (BID) | ORAL | 0 refills | Status: AC
  Filled 2022-10-10: qty 28, 14d supply, fill #0

## 2022-10-10 MED ORDER — DOCUSATE SODIUM 100 MG PO CAPS: 100.0000 mg | ORAL_CAPSULE | Freq: Two times a day (BID) | ORAL | 0 refills | Status: DC

## 2022-10-10 MED ORDER — METHOCARBAMOL 500 MG PO TABS
500.0000 mg | ORAL_TABLET | Freq: Four times a day (QID) | ORAL | 0 refills | Status: DC | PRN
  Filled 2022-10-10: qty 30, 8d supply, fill #0

## 2022-10-10 MED ORDER — SENNA 8.6 MG PO TABS
1.0000 | ORAL_TABLET | Freq: Two times a day (BID) | ORAL | 0 refills | Status: DC
  Filled 2022-10-10: qty 120, 60d supply, fill #0

## 2022-10-10 MED ORDER — TRAMADOL HCL 50 MG PO TABS
50.0000 mg | ORAL_TABLET | Freq: Four times a day (QID) | ORAL | 0 refills | Status: DC
  Filled 2022-10-10: qty 30, 8d supply, fill #0

## 2022-10-10 MED ORDER — ADULT MULTIVITAMIN W/MINERALS CH: 1.0000 | ORAL_TABLET | Freq: Every day | ORAL | Status: DC

## 2022-10-10 MED ORDER — OXYCODONE HCL 5 MG PO TABS
5.0000 mg | ORAL_TABLET | ORAL | 0 refills | Status: DC | PRN
  Filled 2022-10-10: qty 30, 3d supply, fill #0

## 2022-10-10 NOTE — TOC Initial Note (Signed)
Transition of Care [facility]) - Initial/Assessment Note    Patient Details  Name: LE CLOUGHERTY MRN: [id number] Date of Birth: 01-08-1961  Transition of Care [facility]) CM/SW Contact:    Carol Ada, RN Phone Number: 10/10/2022, 12:34 PM  Clinical Narrative:  Chart reviewed.  Patient will be a discharge for today.  I have spoken with patient.  He reports that he will be going to his mother home on discharge.  Patient informs me that his mother's address is [address], [place], [place] [address].  Patient reports that he has no insurance.  He had PCP and has not been going to see the provider.  He will follow back up with Dr. Brayton Layman to obtain an office appointment.  Patient reports that he does not have any DME.  I have asked Erasmo Downer, with [employer] to provide patient with a bedside commode and a rolling walker.    I have informed patient that [facility] PT was recommend, but their is not agency that will accept referral due to no insurance and patient will be going to [place] with his mother on discharge. I have encouraged patient to reach out to Outpatient PT in the [place] area for Outpatient PT.  Patient informed that the cost for Outpatient PT will be private pay.    Patient reports that a person from his job will transport him to his mother's home today.    [facility] pharmacy will fill patient's discharge medications.  I have asked Pharmacy to deliver patient's medications at bedside.    Patient has no other discharge needs.              Expected Discharge Plan: Home/Self Care (Home with family support.  Patient will be going to his mother's room.) Barriers to Discharge:  (Will assist with discharge medications from [place].)   Patient Goals and CMS Choice            Expected Discharge Plan and Services         Expected Discharge Date: 10/10/22               DME Arranged: Gilford Rile rolling DME Agency: [employer] Date DME Agency Contacted: 10/10/22 Time DME  Agency Contacted: 1200 Representative spoke with at [place]: Erasmo Downer            Prior Living Arrangements/Services   Lives with:: Self Patient language and need for interpreter reviewed:: Yes        Need for Family Participation in Patient Care: No (Comment) Care giver support system in place?: Yes (comment) (Patient's mother will assist on discharge.) Current home services: DME, Other (comment) (Patient has no insurance.  Patient is going to his mother's home on discharge.  Patient will not be able to receive [facility] PT due to no insurance.  [facility] does not service the [facility].)    Activities of Daily Living Home Assistive Devices/Equipment: None ADL Screening (condition at time of admission) Patient's cognitive ability adequate to safely complete daily activities?: Yes Is the patient deaf or have difficulty hearing?: No Does the patient have difficulty seeing, even when wearing glasses/contacts?: No Does the patient have difficulty concentrating, remembering, or making decisions?: No Patient able to express need for assistance with ADLs?: No Does the patient have difficulty dressing or bathing?: No Independently performs ADLs?: Yes (appropriate for developmental age) Does the patient have difficulty walking or climbing stairs?: Yes Weakness of Legs: Both Weakness of Arms/Hands: None  Permission Sought/Granted   Permission  granted to share information with : Yes, Verbal Permission Granted              Emotional Assessment       Orientation: : Oriented to Self, Oriented to Place, Oriented to  Time, Oriented to Situation      Admission diagnosis:  Fracture of femoral neck, left, closed ([place]) [S72.002A] Closed left hip fracture, initial encounter ([place]) [S72.002A] Patient Active Problem List   Diagnosis Date Noted   Acute urinary retention 10/09/2022   Leukocytosis 10/08/2022   Orthostatic dizziness 10/08/2022   Closed left hip fracture, initial encounter ([place])  10/06/2022   Fall 10/06/2022   Abnormal EKG 10/06/2022   Normocytic anemia 10/06/2022   Bone lesion_sclerotic lesions in the right iliac bone 10/06/2022   Elevated blood pressure reading 10/06/2022   PCP:  Albina Billet, MD Pharmacy:   [facility] [place] [address] Phone: [phone] Fax: [phone]     Social Determinants of Health (SDOH) Social History: SDOH Screenings   Food Insecurity: No Food Insecurity (10/10/2022)  Housing: Low Risk  (10/10/2022)  Transportation Needs: No Transportation Needs (10/10/2022)  Utilities: Not At Risk (10/10/2022)  Tobacco Use: Low Risk  (10/09/2022)   SDOH Interventions:     Readmission Risk Interventions     No data to display

## 2022-10-10 NOTE — Progress Notes (Signed)
Patient is not able to walk the distance required to go the bathroom, or he/she is unable to safely negotiate stairs required to access the bathroom.  A 3in1 BSC will alleviate this problem  

## 2022-10-10 NOTE — Progress Notes (Signed)
Physical Therapy Treatment Patient Details Name: Christian Hughes MRN: [id number] DOB: July 02, 1961 Today's Date: 10/10/2022   History of Present Illness Christian Hughes is an 62 y.o. male who was admitted with a diagnosis of displaced left femoral neck fracture status post fall.  Pt is now S/P L hip hemiarthroplasty, WBAT with Post Hip precautions.    PT Comments    Pt received seated in recliner upon arrival to room and pt agreeable to therapy.  Pt states he is ready to ambulate and is willing to ambulate around the nursing station.  Pt unable to recall precautions prior to attempting ambulation, so pt was instructed on posterior hip precautions again.  Pt also unable to recall car transfer instructions and advised on that as well.  Pt then ambulated around the nursing station with significantly slowed gait pattern and antalgic gait on the L hip.  Pt with no overt LOB and will benefit from daily ambulation within his mother's home.  Pt left with all needs met and call bell within reach.  Current discharge plans to home with HHPT remain appropriate at this time.  Pt will continue to benefit from skilled therapy in order to address deficits listed below.      Recommendations for follow up therapy are one component of a multi-disciplinary discharge planning process, led by the attending physician.  Recommendations may be updated based on patient status, additional functional criteria and insurance authorization.  Follow Up Recommendations  Home health PT     Assistance Recommended at Discharge Intermittent Supervision/Assistance  Patient can return home with the following A lot of help with walking and/or transfers;Assistance with cooking/housework;Assist for transportation;Help with stairs or ramp for entrance;A lot of help with bathing/dressing/bathroom   Equipment Recommendations  None recommended by PT    Recommendations for Other Services       Precautions / Restrictions  Precautions Precautions: Fall;Posterior Hip Precaution Booklet Issued: Yes (comment) Restrictions Weight Bearing Restrictions: Yes LLE Weight Bearing: Weight bearing as tolerated     Mobility  Bed Mobility               General bed mobility comments: NT, received/left in recliner    Transfers Overall transfer level: Needs assistance Equipment used: Rolling walker (2 wheels) Transfers: Sit to/from Stand Sit to Stand: Min guard           General transfer comment: Mod verbal and visual cues for sequencing for hip prec compliance    Ambulation/Gait Ambulation/Gait assistance: Min guard Gait Distance (Feet): 180 Feet Assistive device: Rolling walker (2 wheels) Gait Pattern/deviations: Step-to pattern, Decreased step length - right, Decreased stance time - left, Antalgic Gait velocity: decreased     General Gait Details: Slow cadence with step-to pattern, antalgic on the LLE but generally steady with no overt LOB, improved activity tolerance compared to prior session   Stairs             Wheelchair Mobility    Modified Rankin (Stroke Patients Only)       Balance Overall balance assessment: Needs assistance Sitting-balance support: Single extremity supported Sitting balance-Leahy Scale: Good     Standing balance support: Bilateral upper extremity supported, During functional activity, Reliant on assistive device for balance Standing balance-Leahy Scale: [place]  Exercises Other Exercises Other Exercises: Car transfer sequencing education and review Other Exercises: Posterior hip precaution education/review Other Exercises: 90 deg L turn training to prevent CKC L hip IR    General Comments        Pertinent Vitals/Pain Pain Assessment Pain Assessment: 0-10 Pain Score: 3  Pain Location: L hip Pain Descriptors / Indicators:  Aching, Sore Pain Intervention(s): Monitored during session, Premedicated before session, Repositioned    Home Living                          Prior Function            PT Goals (current goals can now be found in the care plan section) Acute Rehab PT Goals Patient Stated Goal: " I want to go home safely." PT Goal Formulation: With patient Time For Goal Achievement: 10/22/22 Potential to Achieve Goals: Good Progress towards PT goals: Progressing toward goals    Frequency    BID      PT Plan Current plan remains appropriate    Co-evaluation              AM-PAC PT "6 Clicks" Mobility   Outcome Measure  Help needed turning from your back to your side while in a flat bed without using bedrails?: A Little Help needed moving from lying on your back to sitting on the side of a flat bed without using bedrails?: A Little Help needed moving to and from a bed to a chair (including a wheelchair)?: A Little Help needed standing up from a chair using your arms (e.g., wheelchair or bedside chair)?: A Little Help needed to walk in hospital room?: A Little Help needed climbing 3-5 steps with a railing? : A Little 6 Click Score: 18    End of Session Equipment Utilized During Treatment: Gait belt Activity Tolerance: Patient tolerated treatment well Patient left: in chair;with call bell/phone within reach;with SCD's reapplied Nurse Communication: Mobility status;Weight bearing status PT Visit Diagnosis: Other abnormalities of gait and mobility (R26.89);Muscle weakness (generalized) (M62.81);Difficulty in walking, not elsewhere classified (R26.2);Dizziness and giddiness (R42)     Time: [id number] PT Time Calculation (min) (ACUTE ONLY): 39 min  Charges:  $Gait Training: 23-37 mins $Self Care/Home Management: 8-22                     Gwenlyn Saran, PT, DPT Physical Therapist - [facility]  10/10/22, 2:15 PM

## 2022-10-10 NOTE — Discharge Summary (Signed)
Physician Discharge Summary   Patient: Christian Hughes MRN: [id number] DOB: 11-Nov-1960  Admit date:     10/06/2022  Discharge date: 10/10/22  Discharge Physician: Ezekiel Slocumb   PCP: Albina Billet, MD   Recommendations at discharge:   Follow up with Orthopedic surgery in 10-14 days Follow up with Primary Care in 2 weeks Repeat CBC, BMP in 2 weeks Two weeks DVT prophylaxis with full dose aspirin 14 days Consider oncology and further evaluation of right iliac sclerotic lesions incidentally seen on imaging  Discharge Diagnoses: Principal Problem:   Closed left hip fracture, initial encounter [facility]) Active Problems:   Fall   Acute urinary retention   Abnormal EKG   Normocytic anemia   Bone lesion_sclerotic lesions in the right iliac bone   Elevated blood pressure reading   Leukocytosis  Resolved Problems:   Orthostatic dizziness  Hospital Course: HPI on admission 3/8, by Dr. Blaine Hamper: "Christian Hughes is a 63 y.o. male without significant past medical history except for occasional vaping amd marijuana use, who presents with fall and left hip pain.      Pt states that he slipped and fell at work at about 11:00 AM.  Denies loss of consciousness.  He injured his left hip, causing left hip pain, which is constant, sharp, severe, nonradiating, aggravated by movement.  No leg numbness.  Patient denies chest pain, cough, shortness breath.  No nausea vomiting, diarrhea or abdominal pain.  No symptoms of UTI.  Patient strongly denies any head or neck injury.  He refused CT scan of head and neck.   Data reviewed independently and ED Course: pt was found to have WBC 10.9, hemoglobin 11.5, GFR> 60, temperature normal, blood pressure 170/102, heart rate 57, RR 18, oxygen saturation 94% on room air.  Chest x-ray showed cardiomegaly and minimal interstitial disease.  X-ray of left hip showed left femoral neck fracture with some sclerotic bone lesion.  Patient is admitted to telemetry bed as inpatient.   Dr. Rockey Situ of card and Dr. Mack Guise of Ortho are consulted.     X-ray of left hip 1. Acute angulated left femoral neck fracture. 2. Two round sclerotic lesions in the right iliac bone are nonspecific. While these could represent bone islands, metastatic disease is not excluded. Correlate for history of malignancy."   3/9 -- patient taken to OR by orthopedic surgery.  Assessment and Plan: * Closed left hip fracture, initial encounter ([place]) Due to mechanical fall.   --Mgmt per orthopedic surgery, Dr. Mack Guise --s/p Left hip hemiarthroplasty on 10/07/22 --Pain control PRN  --Robaxin PRN muscle spasm --Bowel regimen --PT/OT evaluation ORTHO RECOMMENDS -- --Follow-up in 10 to 14 days for staple removal. --Continue enteric-coated aspirin 325 mg p.o. twice daily for DVT prophylaxis until follow-up. --Weightbearing as tolerated on left leg  --Continue posterior hip precautions for at least the first several months after surgery.    3/10 -- pt had orthostatic dizziness which limited ability for full PT evaluation 3/11 -- AM PT assessment limited by uncontrolled pain, dizziness improved 3/12 -- improved with PT, d/c with HH  Acute urinary retention 3/10 - Required in/out cath x 2, unable to void post-op. Suspect due to anesthesia and pain meds. --Continue bladder scans and PRN in/out cath. --Hope to avoid Foley.  Fall Resulted in left hip fracture. Of note, patient declined CT head and C-spine on admission. He denies head injury or LOC.  Orthostatic dizziness-resolved as of 10/10/2022 3/10 -- pt had dizziness up with PT today,  and BP did drop. 3/11 -- improved with stopping amlodipine Suspect BP lower with use of pain meds, previously high due to pain pre-operatively. --Monitor BP closely and check orthostatic vitals daily  Leukocytosis WBC 10.9 >> 14.8 >> 17.2 >> 12.8 >> 11.7 Likely reactive in setting of fracture and surgery. --PCP follow up for repeat CBC in 1-2  weeks  Elevated blood pressure reading On admission, in setting of pain. -IV hydralazine as needed  -was started amlodipine 5 mg on 3/9, stopped 3/10 due to orthostatic dizziness - not on antihypertensive at d/c - PCP follow up  Bone lesion_sclerotic lesions in the right iliac bone This is an incidental finding by x-ray which showed two round sclerotic lesions in the right iliac bone, nonspecific. While these could represent bone islands, metastatic disease is not excluded.  -need to give referral to oncology for outpt follow up.  Normocytic anemia Mild. Hemoglobin 11.5 on admission, no baseline hemoglobin available --Monitor CBC  Abnormal EKG EKG with T wave inversion in lateral leads and V4-V6, ST elevation in V1-V2.  Pt denies any chest pain, cough, shortness breath.  Initial troponin 10 -Consulted Dr. Rockey Situ for cardiology for presurgical clearance - granted - Echo 3/9 showed normal EF 55-60%, no WMA's, grade I dd, mod LVH, no significant valvular disease         Consultants: orthopedic surgery Procedures performed: left hip hemiarthroplasty   Disposition: Home health  Diet recommendation:  Cardiac diet  DISCHARGE MEDICATION: Allergies as of 10/10/2022   No Known Allergies      Medication List     TAKE these medications    aspirin EC 325 MG tablet Commonly known as: Bayer Aspirin Take 1 tablet (325 mg total) by mouth 2 (two) times daily with a meal for 14 days.   docusate sodium 100 MG capsule Commonly known as: COLACE Take 1 capsule (100 mg total) by mouth 2 (two) times daily. Hold if having loose stools or frequent BM's   methocarbamol 500 MG tablet Commonly known as: ROBAXIN Take 1 tablet (500 mg total) by mouth every 6 (six) hours as needed for muscle spasms.   multivitamin with minerals Tabs tablet Take 1 tablet by mouth daily. Start taking on: October 11, 2022   oxyCODONE 5 MG immediate release tablet Commonly known as: Oxy IR/ROXICODONE Take  1-2 tablets (5-10 mg total) by mouth every 4 (four) hours as needed (pain not controlled by Tylenol and Tramadol).   senna 8.6 MG Tabs tablet Commonly known as: SENOKOT Take 1 tablet (8.6 mg total) by mouth 2 (two) times daily. Hold if having loose stools or frequent BM's   traMADol 50 MG tablet Commonly known as: ULTRAM Take 1 tablet (50 mg total) by mouth every 6 (six) hours.               Durable Medical Equipment  (From admission, onward)           Start     Ordered   10/10/22 1151  For home use only DME Bedside commode  Once       Question:  Patient needs a bedside commode to treat with the following condition  Answer:  Impaired mobility   10/10/22 1150   10/10/22 1150  For home use only DME Walker rolling  Once       Question Answer Comment  Walker: With [place]   Patient needs a walker to treat with the following condition Impaired mobility      10/10/22  1150   10/08/22 1257  For home use only DME Walker rolling  Once       Question Answer Comment  Walker: With [place]   Patient needs a walker to treat with the following condition Unsteady gait      10/08/22 1256   10/08/22 1257  For home use only DME 3 n 1  Once        10/08/22 1256              Discharge Care Instructions  (From admission, onward)           Start     Ordered   10/10/22 0000  Leave dressing on - Keep it clean, dry, and intact until clinic visit        10/10/22 1144            Follow-up Information     Thornton Park, MD. Go on 10/23/2022.   Specialty: Orthopedic Surgery Why: post-op follow up in 10-14 days;  Appt @ 10:00 am Contact information: [place] [address] [phone]         Albina Billet, MD. Schedule an appointment as soon as possible for a visit in 1 week(s).   Specialty: Internal Medicine Why: hospital follow up in approx 2 weeks Contact information: [place] [address]   [place]  [address] [phone]                Discharge Exam: Danley Danker Weights   10/06/22 1243  Weight: 99.8 kg   General exam: awake, alert, no acute distress HEENT: atraumatic, clear conjunctiva, anicteric sclera, moist mucus membranes, hearing grossly normal  Respiratory system: CTAB, no wheezes, rales or rhonchi, normal respiratory effort. Cardiovascular system: normal S1/S2, RRR, no JVD, murmurs, rubs, gallops,  no pedal edema.   Gastrointestinal system: soft, NT, ND, no HSM felt, +bowel sounds. Central nervous system: A&O x4. no gross focal neurologic deficits, normal speech Extremities: moves all , no edema, normal tone Skin: dry, intact, normal temperature, normal color, No rashes, lesions or ulcers Psychiatry: normal mood, congruent affect, judgement and insight appear normal   Condition at discharge: stable  The results of significant diagnostics from this hospitalization (including imaging, microbiology, ancillary and laboratory) are listed below for reference.   Imaging Studies: DG HIP UNILAT WITH PELVIS 2-3 VIEWS LEFT  Result Date: 10/07/2022 CLINICAL DATA:  Hip fracture. EXAM: DG HIP (WITH OR WITHOUT PELVIS) 1V PORT LEFT COMPARISON:  10/06/2022 FINDINGS: Patient has had interval LEFT hip hemiarthroplasty. Hardware is well seated. No interval fractures. Two sclerotic lesions are again seen in the RIGHT iliac bone, raising the question of metastatic disease. IMPRESSION: 1. Status post LEFT hip hemiarthroplasty. 2. Sclerotic lesions overlie the RIGHT iliac bone, raising the question of metastatic disease. Recommend correlation with history. Electronically Signed   By: Nolon Nations M.D.   On: 10/07/2022 14:48   ECHOCARDIOGRAM COMPLETE  Result Date: 10/07/2022    ECHOCARDIOGRAM REPORT   Patient Name:   Christian Hughes [place] Date of Exam: 10/07/2022 Medical Rec #:  [id number]      Height:       72.0 in Accession #:    [id number]     Weight:       220.0 lb Date of Birth:  [birth year]       BSA:           2.219 m Patient Age:    69 years  BP:           196/120 mmHg Patient Gender: M              HR:           73 bpm. Exam Location:  [facility] Procedure: 2D Echo Indications:     Preoperative Evaluation  History:         Patient has no prior history of Echocardiogram examinations.  Sonographer:     Kathlen Brunswick RDCS Referring Phys:  Christian Hughes Diagnosing Phys: Ida Rogue MD  Sonographer Comments: Suboptimal parasternal window and no subcostal window. Image acquisition challenging due to patient body habitus. IMPRESSIONS  1. Left ventricular ejection fraction, by estimation, is 55 to 60%. The left ventricle has normal function. The left ventricle has no regional wall motion abnormalities. There is moderate left ventricular hypertrophy. Left ventricular diastolic parameters are consistent with Grade I diastolic dysfunction (impaired relaxation).  2. Right ventricular systolic function is normal. The right ventricular size is normal.  3. The mitral valve is normal in structure. No evidence of mitral valve regurgitation. No evidence of mitral stenosis.  4. The aortic valve has an indeterminant number of cusps, unable to exclude bicuspid aortic valve. Aortic valve regurgitation is not visualized. Aortic valve sclerosis/calcification is present, without any evidence of aortic stenosis.  5. The inferior vena cava is normal in size with greater than 50% respiratory variability, suggesting right atrial pressure of 3 mmHg. FINDINGS  Left Ventricle: Left ventricular ejection fraction, by estimation, is 55 to 60%. The left ventricle has normal function. The left ventricle has no regional wall motion abnormalities. The left ventricular internal cavity size was normal in size. There is  moderate left ventricular hypertrophy. Left ventricular diastolic parameters are consistent with Grade I diastolic dysfunction (impaired relaxation). Right Ventricle: The right ventricular size is normal. No increase in  right ventricular wall thickness. Right ventricular systolic function is normal. Left Atrium: Left atrial size was normal in size. Right Atrium: Right atrial size was normal in size. Pericardium: There is no evidence of pericardial effusion. Mitral Valve: The mitral valve is normal in structure. Mild mitral annular calcification. No evidence of mitral valve regurgitation. No evidence of mitral valve stenosis. Tricuspid Valve: The tricuspid valve is normal in structure. Tricuspid valve regurgitation is trivial. No evidence of tricuspid stenosis. Aortic Valve: The aortic valve has an indeterminant number of cusps. Aortic valve regurgitation is not visualized. Aortic valve sclerosis/calcification is present, without any evidence of aortic stenosis. Aortic valve peak gradient measures 10.8 mmHg. Pulmonic Valve: The pulmonic valve was normal in structure. Pulmonic valve regurgitation is not visualized. No evidence of pulmonic stenosis. Aorta: The aortic root is normal in size and structure. Venous: The inferior vena cava is normal in size with greater than 50% respiratory variability, suggesting right atrial pressure of 3 mmHg. IAS/Shunts: No atrial level shunt detected by color flow Doppler.  LEFT VENTRICLE PLAX 2D LVIDd:         4.00 cm   Diastology LVIDs:         2.80 cm   LV e' medial:    5.33 cm/s LV PW:         1.80 cm   LV E/e' medial:  12.7 LV IVS:        1.70 cm   LV e' lateral:   8.92 cm/s LVOT diam:     2.40 cm   LV E/e' lateral: 7.6 LV SV:  70 LV SV Index:   31 LVOT Area:     4.52 cm  RIGHT VENTRICLE RV Basal diam:  2.80 cm RV S prime:     15.90 cm/s TAPSE (M-mode): 1.9 cm LEFT ATRIUM             Index        RIGHT ATRIUM           Index LA diam:        4.20 cm 1.89 cm/m   RA Area:     18.90 cm LA Vol (A2C):   52.7 ml 23.75 ml/m  RA Volume:   50.30 ml  22.67 ml/m LA Vol (A4C):   48.8 ml 22.00 ml/m LA Biplane Vol: 56.2 ml 25.33 ml/m  AORTIC VALVE                 PULMONIC VALVE AV Area (Vmax):  2.60 cm     PV Vmax:        0.98 m/s AV Vmax:        164.00 cm/s  PV Peak grad:   3.8 mmHg AV Peak Grad:   10.8 mmHg    RVOT Peak grad: 2 mmHg LVOT Vmax:      94.20 cm/s LVOT Vmean:     65.700 cm/s LVOT VTI:       0.154 m  AORTA Ao Root diam: 3.70 cm Ao Asc diam:  2.90 cm MITRAL VALVE MV Area (PHT): 3.42 cm    SHUNTS MV Decel Time: 222 msec    Systemic VTI:  0.15 m MV E velocity: 67.60 cm/s  Systemic Diam: 2.40 cm MV A velocity: 89.10 cm/s MV E/A ratio:  0.76 Ida Rogue MD Electronically signed by Ida Rogue MD Signature Date/Time: 10/07/2022/10:23:06 AM    Final    DG Hip Unilat W or Wo Pelvis 2-3 Views Left  Result Date: 10/06/2022 CLINICAL DATA:  Left hip pain after fall. EXAM: DG HIP (WITH OR WITHOUT PELVIS) 2-3V LEFT COMPARISON:  None Available. FINDINGS: Acute fracture of the left femoral neck with angulation and foreshortening. Two round sclerotic lesions in the right iliac bone measuring up 1.5 cm. Lower lumbar spine degenerative changes. Soft tissues are unremarkable. IMPRESSION: 1. Acute angulated left femoral neck fracture. 2. Two round sclerotic lesions in the right iliac bone are nonspecific. While these could represent bone islands, metastatic disease is not excluded. Correlate for history of malignancy. Electronically Signed   By: Titus Dubin M.D.   On: 10/06/2022 14:12   DG Chest Portable 1 View  Result Date: 10/06/2022 CLINICAL DATA:  Left hip fracture following a fall. EXAM: PORTABLE CHEST 1 VIEW COMPARISON:  None Available. FINDINGS: Enlarged cardiac silhouette. Tortuous aorta with deviation of the trachea to the right. Clear lungs with normal vascularity. Minimally prominent interstitial markings. Tiny calcified granuloma or bone island overlying each anterior 2nd rib. No fracture or pneumothorax seen. IMPRESSION: 1. No acute abnormality. 2. Cardiomegaly and minimal chronic interstitial lung disease. Electronically Signed   By: Claudie Revering M.D.   On: 10/06/2022 14:11     Microbiology: No results found for this or any previous visit.  Labs: CBC: Recent Labs  Lab 10/06/22 1254 10/08/22 0411 10/08/22 1756 10/09/22 0600 10/10/22 0415  WBC 10.9* 14.8* 17.2* 12.8* 11.7*  NEUTROABS 8.1*  --   --   --   --   HGB 11.5* 10.7* 11.1* 10.1* 9.9*  HCT 36.0* 33.4* 34.5* 31.2* 30.8*  MCV 88.7 89.1 89.4 88.6 89.0  PLT  348 277 304 274 0000000   Basic Metabolic Panel: Recent Labs  Lab 10/06/22 1254 10/08/22 0411 10/10/22 0415  NA 134* 137 134*  K 3.6 3.6 4.1  CL 103 107 102  CO2 '23 23 26  '$ GLUCOSE 148* 111* 105*  BUN '23 13 17  '$ CREATININE 0.97 0.90 0.85  CALCIUM 8.3* 8.3* 8.5*   Liver Function Tests: Recent Labs  Lab 10/06/22 1254  AST 22  ALT 20  ALKPHOS 80  BILITOT 0.5  PROT 6.9  ALBUMIN 3.6   CBG: No results for input(s): "GLUCAP" in the last 168 hours.  Discharge time spent: greater than 30 minutes.  Signed: Ezekiel Slocumb, DO [facility] 10/10/2022

## 2022-10-18 ENCOUNTER — Other Ambulatory Visit: Payer: Self-pay

## 2022-11-15 ENCOUNTER — Ambulatory Visit: Payer: Worker's Compensation | Attending: Orthopedic Surgery

## 2022-11-15 DIAGNOSIS — R2689 Other abnormalities of gait and mobility: Secondary | ICD-10-CM | POA: Insufficient documentation

## 2022-11-15 DIAGNOSIS — Z96642 Presence of left artificial hip joint: Secondary | ICD-10-CM | POA: Diagnosis not present

## 2022-11-15 DIAGNOSIS — R262 Difficulty in walking, not elsewhere classified: Secondary | ICD-10-CM | POA: Diagnosis present

## 2022-11-15 DIAGNOSIS — R29898 Other symptoms and signs involving the musculoskeletal system: Secondary | ICD-10-CM | POA: Insufficient documentation

## 2022-11-15 NOTE — Therapy (Addendum)
OUTPATIENT PHYSICAL THERAPY LOWER EXTREMITY EVALUATION   Patient Name: Christian Hughes MRN: [id number] DOB:[birth year], 62 y.o., male Today's Date: 11/15/2022  END OF SESSION:  PT End of Session - 11/15/22 1455     Visit Number 1           Number of Visits 20 Date for PT Reevaluation 12/22/2022 PT start 1500 PT end 1550 Total time 50 mins  Past Medical History:  Diagnosis Date   Engages in vaping    H/O ventral hernia    S/p of repair   Marijuana use    Orthostatic dizziness 10/08/2022   Past Surgical History:  Procedure Laterality Date   HIP ARTHROPLASTY Left 10/07/2022   Procedure: ARTHROPLASTY BIPOLAR HIP (HEMIARTHROPLASTY);  Surgeon: Juanell Fairly, MD;  Location: [facility] ORS;  Service: Orthopedics;  Laterality: Left;   Ventral hernia repair     Patient Active Problem List   Diagnosis Date Noted   Acute urinary retention 10/09/2022   Leukocytosis 10/08/2022   Closed left hip fracture, initial encounter 10/06/2022   Fall 10/06/2022   Abnormal EKG 10/06/2022   Normocytic anemia 10/06/2022   Bone lesion_sclerotic lesions in the right iliac bone 10/06/2022   Elevated blood pressure reading 10/06/2022    PCP: Dewaine Oats MD  REFERRING PROVIDER: Juanell Fairly MD  REFERRING DIAG: [phone] (ICD-10-CM) - Presence of left artificial hip joint   THERAPY DIAG:  Weakness and pain in LLE   Rationale for Evaluation and Treatment: Rehabilitation  ONSET DATE: 10/07/2022  SUBJECTIVE:  SUBJECTIVE STATEMENT: " My mom took care of me after my hip surgery. I had therapy at home. I returned to my home alone after my first follow up appointment. I have pain in my buttocks, groin and the corner of my buttocks of about 2 to 3/10. I have high pain tolerance. I am unable to walk normal and I feel weak. I began to use this cane on my own after my PT at home stopped.  I can't wait to go to work because, I love to work."   PERTINENT HISTORY: Christian Hughes is a 62 y.o. male without  significant past medical history except for occasional vaping amd marijuana use, who presents with fall and left hip pain.  Pt states that he slipped and fell at work at about 11:00 AM.  Denies loss of consciousness.  He injured his left hip, causing left hip pain, which is constant, sharp, severe, nonradiating, aggravated by movement.  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    PAIN:  Are you having pain? Yes: Pain location: 2-3/10 Pain description: stabbing Aggravating factors: Moving, walking, WB   Relieving factors: rest, OTC meds, ice  PRECAUTIONS: Posterior hip  WEIGHT BEARING RESTRICTIONS: Yes:  WBAT  FALLS:  Has patient fallen in last 6 months? Yes. 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   PLOF: Independent  PATIENT GOALS: "I want to return to work without hurting and falling as soon as I can."  NEXT MD VISIT: 11/22/2022  OBJECTIVE:   PATIENT SURVEYS:  LEFS 18/80 FOTO 36  COGNITION: Overall cognitive status: Within functional limits for tasks assessed and impulsive.  SENSATION: WFL  MUSCLE LENGTH: deferred  test 2/2 to pain.   POSTURE: rounded shoulders  PALPATION: Pain in L sacral border, Iliac crest, Gluet med, TFL and greater trochanter    LOWER EXTREMITY ROM:   Active ROM Right eval Left eval  Hip flexion WFL 20 deg with pain  Hip extension [facility]    Hip abduction WFL 20 with pain   Hip adduction [facility]   Hip internal rotation [facility]   Hip external rotation [facility]   Knee flexion [facility]  Knee extension [facility]  Ankle dorsiflexion [facility]  Ankle plantarflexion [facility] WFL  Ankle inversion [facility]  Ankle eversion WFL WFL    (Blank rows = not tested)  LOWER EXTREMITY MMT:  MMT Right eval Left eval  Hip flexion WFL 2+  Hip extension [facility] 2  Hip abduction [facility] 2  Hip adduction [facility]   Hip internal rotation [facility]   Hip external rotation [facility]   Knee flexion WFL 3-  Knee extension WFl 3  Ankle dorsiflexion WFL 3+  Ankle plantarflexion WFL 4  Ankle inversion WFL 4  Ankle eversion WFL 3-   (Blank rows = not tested) Pt's L ankle remains inverted 10 degrees since the Surgery.   FUNCTIONAL TESTS:  30 seconds chair stand test: 5 reps completed Timed up and go (TUG): 19secs 10 meter walk test: 12secs= 1,2 m/secs             Balance Test : SL standing unable, Modified tandem 10 secs, Tandem Unable, Rhomberg with EO 5 secs and unable with EC.  GAIT: Distance walked: 30 ft  Assistive device utilized: Single point cane Level of assistance: SBA Comments: Pt unsteady on feet with severe antalgic gait, decreased loading response, absent heel strike and absent toe off gait with uneven stride and decreased speed. Heavy reliance on the [facility]   TODAY'S TREATMENT:                                                                                                                              DATE: 11/15/2022  Low complexity Evaluation : 35 mins   Therapeutic Activities:15 mins  PT educated pt regarding safety, proper use of the SPC and FWW. PT discussed anatomy, findings and goals to establish pt specific POC.   PATIENT EDUCATION:  Education details: Pt education about findings, rehab process, significance of fall prevention and use of FWW  and anatomy.  Person educated: Patient Education method: Medical illustrator Education comprehension: verbalized understanding  HOME EXERCISE PROGRAM: PT will begin HEP next treatment session meanwhile pt will continue with HEP package provided after THR. Package  Reviewed.   ASSESSMENT:  CLINICAL IMPRESSION: Patient is a 62 y.o. pleasant, active, motivated and eager Male who  was seen today for physical therapy evaluation and treatment for L hip pain and weakness after a L hip Hemiarthroplasty due to fall at work. Pt presents using a wooden SPC with sever antalgic gait  and heavy reliance on the [facility]. Pt lives alone and is able to manage ADLs at Household level. Pt remains limited in community level activity participation due to pain and weakness in LLE after the surgery. 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 Today's Session revealed weakness in L hip and Ankle eversion, decreased ROM, increased pain with movement and with palpation, gait deviation, High risk of falling limiting pt with all functional mobility which is confirmed by  Balance test, Muscle strength test, LEFS score, and FOTO score. PT educated and advised pt to use FWW and its significance to safety and appropriate progress until his strength and balance is improved.  Pt agrees and demonstrated good understanding. Pt will benefit from Skilled PT interventions 2 x week for 10 weeks to address pt's impairments and help pt return to work with LR[employer].      OBJECTIVE IMPAIRMENTS: Abnormal gait, decreased activity tolerance, decreased balance, decreased endurance, decreased knowledge of condition, decreased knowledge of use of DME, decreased mobility, difficulty walking, decreased ROM, decreased strength, and dizziness.   ACTIVITY LIMITATIONS: carrying, lifting, bending, sitting, standing, squatting, sleeping, stairs, transfers, dressing, and locomotion level  PARTICIPATION LIMITATIONS: cleaning, shopping, community activity, occupation, and yard work  PERSONAL FACTORS:  Pt is impulsive and eager to get better so that he can return to work.   are also affecting patient's functional outcome.   REHAB POTENTIAL: Good  CLINICAL DECISION MAKING: Stable/uncomplicated  EVALUATION COMPLEXITY: Low   GOALS: Goals reviewed with  patient? Yes  SHORT TERM GOALS: Target date: 12/22/2022 Pt will improve L hip ROM to 10 to 90 degrees without pain in order to promote normal gait and stability.  Baseline: 20 degree Hip felx and 0 deg Ext Goal status: INITIAL  2.  Pt will be able to ambulate 540ft safely with Cane without fall and pain . Baseline: 30 ft with SBA and heavy reliance on SPC Goal status: INITIAL    LONG TERM GOALS: Target date: 01/26/2023  Pt will Improve TUG time to <10secs without SPC to demonstrate decreased fall risk.  Baseline: 19 secs with SPC Goal status: INITIAL  2.  Pt will Improve LEFS score to >65/80 to demonstrate improved QoL Baseline: 18/80 Goal status: INITIAL  3.  Pt will be able to ambulate Independently  with LRAD without LOB and pain in order to return to  Baseline: With [facility] and FWW Goal status: INITIAL  4.  Pt will score >50 on FOTO Baseline: 36/56 Goal status: INITIAL  5.  Pt will demonstrate 4/5 L hip strength without pain to improve stability and safety with all functional mobility in order to return to work.  Baseline: 2/5 grossly  Goal status: INITIAL     PLAN:  PT FREQUENCY: 2x/week  PT DURATION: 10 weeks  PLANNED INTERVENTIONS: Therapeutic exercises, Therapeutic activity, Neuromuscular re-education, Balance training, Gait training, Patient/Family education, Self Care, and Joint mobilization  PLAN FOR NEXT SESSION: Introduce stretches, Hip ROM exs, gait  training with FWW to normalize gait, balance activities.   Janet Berlin PT DPT 1:07 PM,11/16/22

## 2022-11-20 ENCOUNTER — Ambulatory Visit: Payer: Worker's Compensation | Attending: Orthopedic Surgery | Admitting: Physical Therapy

## 2022-11-20 DIAGNOSIS — R262 Difficulty in walking, not elsewhere classified: Secondary | ICD-10-CM | POA: Diagnosis present

## 2022-11-20 DIAGNOSIS — R29898 Other symptoms and signs involving the musculoskeletal system: Secondary | ICD-10-CM | POA: Insufficient documentation

## 2022-11-20 DIAGNOSIS — Z96642 Presence of left artificial hip joint: Secondary | ICD-10-CM | POA: Diagnosis present

## 2022-11-20 DIAGNOSIS — R2689 Other abnormalities of gait and mobility: Secondary | ICD-10-CM | POA: Diagnosis present

## 2022-11-20 NOTE — Therapy (Unsigned)
OUTPATIENT PHYSICAL THERAPY TREATMENT NOTE   Patient Name: Christian Hughes MRN: [id number] DOB:28-Jan-1961, 62 y.o., male Today's Date: 11/20/2022  PCP: Dewaine Oats MD  REFERRING PROVIDER: Juanell Fairly MD   END OF SESSION:    Past Medical History:  Diagnosis Date   Engages in vaping    H/O ventral hernia    S/p of repair   Marijuana use    Orthostatic dizziness 10/08/2022   Past Surgical History:  Procedure Laterality Date   HIP ARTHROPLASTY Left 10/07/2022   Procedure: ARTHROPLASTY BIPOLAR HIP (HEMIARTHROPLASTY);  Surgeon: Juanell Fairly, MD;  Location: [facility] ORS;  Service: Orthopedics;  Laterality: Left;   Ventral hernia repair     Patient Active Problem List   Diagnosis Date Noted   Acute urinary retention 10/09/2022   Leukocytosis 10/08/2022   Closed left hip fracture, initial encounter 10/06/2022   Fall 10/06/2022   Abnormal EKG 10/06/2022   Normocytic anemia 10/06/2022   Bone lesion_sclerotic lesions in the right iliac bone 10/06/2022   Elevated blood pressure reading 10/06/2022    REFERRING DIAG: N82.956 (ICD-10-CM) - Presence of left artificial hip joint   THERAPY DIAG:  History of left hip hemiarthroplasty  Weakness of left leg  Balance problems  Difficulty in walking, not elsewhere classified  Rationale for Evaluation and Treatment Rehabilitation  PERTINENT HISTORY: From initial eval: Christian Hughes is a 62 y.o. male without significant past medical history except for occasional vaping amd marijuana use, who presents with fall and left hip pain.  Pt states that he slipped and fell at work at about 11:00 AM.  Denies loss of consciousness.  He injured his left hip, causing left hip pain, which is constant, sharp, severe, nonradiating, aggravated by movement.  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    "My mom took care of me after my hip surgery. I had  therapy at home. I returned to my home alone after my first follow up appointment. I have pain in my buttocks, groin and the corner of my buttocks of about 2 to 3/10. I have high pain tolerance. I am unable to walk normal and I feel weak. I began to use this cane on my own after my PT at home stopped.  I can't wait to go to work because, I love to work."   PAIN:  Are you having pain? Yes: Pain location: 2-3/10 Pain description: stabbing Aggravating factors: Moving, walking, WB   Relieving factors: rest, OTC meds, ice    WEIGHT BEARING RESTRICTIONS: Yes:  WBAT   FALLS:  Has patient fallen in last 6 months? Yes. 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 Pt negotiates some small staircases in town. Pt has historically taken 24-packs of water bottles to his work for customers.    PLOF: Independent   PATIENT GOALS: "I want to return to work without hurting and falling as soon as I can."   NEXT MD VISIT: 11/22/2022    PRECAUTIONS: Posterior hip precautions   SUBJECTIVE:  SUBJECTIVE STATEMENT:  Patient reports compliance with his HEP. He reports completing regular walking each day with his FWW. Patient reports some pain affecting groin and gluteal region. Pt not using Rx pain medicine at this time. Pt continuing aspirin for DVT prophylaxis.    PAIN:  Are you having pain? No and Yes: NPRS scale: 1.5-2/10 Pain location: L groin, L  posterolateral hip   OBJECTIVE: (objective measures completed at initial evaluation unless otherwise dated)   PATIENT SURVEYS:  LEFS 18/80 FOTO 36   COGNITION: Overall cognitive status: Within functional limits for tasks assessed and impulsive.                            SENSATION: WFL   MUSCLE LENGTH: deferred  test 2/2 to pain.    POSTURE: rounded shoulders   PALPATION: Pain in L sacral border, Iliac crest, Gluet med, TFL and greater trochanter     LOWER EXTREMITY ROM:    Active ROM Right eval Left eval  Hip flexion WFL 20 deg with pain  Hip extension [facility]    Hip abduction WFL 20 with pain   Hip adduction [facility]    Hip internal rotation [facility]    Hip external rotation [facility]    Knee flexion [facility]  Knee extension [facility]  Ankle dorsiflexion [facility]  Ankle plantarflexion [facility] WFL  Ankle inversion [facility]  Ankle eversion WFL WFL   (Blank rows = not tested)   LOWER EXTREMITY MMT:   MMT Right eval Left eval  Hip flexion WFL 2+  Hip extension [facility] 2  Hip abduction [facility] 2  Hip adduction [facility]    Hip internal rotation [facility]    Hip external rotation [facility]    Knee flexion WFL 3-  Knee extension WFl 3  Ankle dorsiflexion WFL 3+  Ankle plantarflexion WFL 4  Ankle inversion WFL 4  Ankle eversion WFL 3-   (Blank rows = not tested) Pt's L ankle remains inverted 10 degrees since the Surgery.     FUNCTIONAL TESTS:  30 seconds chair stand test: 5 reps completed Timed up and go (TUG): 19secs 10 meter walk test: 12secs= 1,2 m/secs             Balance Test : SL standing unable, Modified tandem 10 secs, Tandem Unable, Rhomberg with EO 5 secs and unable with EC.  GAIT: Distance walked: 30 ft  Assistive device utilized: Single point cane Level of assistance: SBA Comments: Pt unsteady on feet with severe antalgic gait, decreased loading response, absent heel strike and absent toe off gait with uneven stride and decreased speed. Heavy reliance on the [facility]       TODAY'S TREATMENT:                                                                                                                               DATE: 11/20/2022  Manual Therapy - for symptom modulation, soft tissue sensitivity and mobility, joint mobility, ROM   STM/DTM  Neuromuscular Re-education - for nervous system downregulation, gluteal musculature activation and exercises to promote LE kinetic chain stability    Therapeutic Activities - patient education, repetitive task practice for improved performance of daily functional activities e.g. transferring    Therapeutic Exercise - for improved soft tissue flexibility and extensibility as needed for ROM, improved strength as needed to improve performance of CKC activities/functional movements  Supine bent-knee clamshell; Red Tband; 2x10 Quad set; 2x10, 5 sec hold Glute set; 2x10, 5 sec hold;    PATIENT EDUCATION:  Education details: Pt education about findings, rehab process, significance of fall prevention and use of FWW  and anatomy.  Person educated: Patient Education method: Medical illustrator Education comprehension: verbalized understanding   HOME EXERCISE PROGRAM: PT will begin HEP next treatment session meanwhile pt will continue with HEP package provided after THR. Package  Reviewed.    ASSESSMENT:   CLINICAL IMPRESSION: Patient is a 62 y.o. pleasant, active, motivated and eager Male who was seen today for physical therapy evaluation and treatment for L hip pain and weakness after a L hip Hemiarthroplasty due to fall at work. Pt presents using a wooden SPC with sever antalgic gait and heavy reliance on the [facility]. Pt lives alone and is able to manage ADLs at Household level. Pt remains limited in community level activity participation due to pain and weakness in LLE after the surgery. 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 Today's  Session revealed weakness in L hip and Ankle eversion, decreased ROM, increased pain with movement and with palpation, gait deviation, High risk of falling limiting pt with all functional mobility which is confirmed by  Balance test, Muscle strength test, LEFS score, and FOTO score. PT educated and advised pt to use FWW and its significance to safety and appropriate progress until his strength and balance is improved.  Pt agrees and demonstrated good understanding. Pt will benefit from Skilled PT interventions 2 x week for 10 weeks to address pt's impairments and help pt return to work with LRAD.       OBJECTIVE IMPAIRMENTS: Abnormal gait, decreased activity tolerance, decreased balance, decreased endurance, decreased knowledge of condition, decreased knowledge of use of DME, decreased mobility, difficulty walking, decreased ROM, decreased strength, and dizziness.    ACTIVITY LIMITATIONS: carrying, lifting, bending, sitting, standing, squatting, sleeping, stairs, transfers, dressing, and locomotion level   PARTICIPATION LIMITATIONS: cleaning, shopping, community activity, occupation, and yard work   PERSONAL FACTORS:  Pt is impulsive and eager to get better so that he can return to work.   are also affecting patient's functional outcome.    REHAB POTENTIAL: Good   CLINICAL DECISION MAKING: Stable/uncomplicated   EVALUATION COMPLEXITY: Low     GOALS: Goals reviewed with patient? Yes   SHORT TERM GOALS: Target date: 12/22/2022 Pt will improve L hip ROM to 10 to 90 degrees without pain in order to promote normal gait and stability.  Baseline: 20 degree Hip felx and 0 deg Ext Goal status: INITIAL   2.  Pt will be able to ambulate 559ft safely with Cane without fall and pain . Baseline: 30 ft with SBA and heavy reliance on SPC Goal status: INITIAL       LONG TERM GOALS: Target date: 01/26/2023   Pt will Improve TUG  time to <10secs without SPC to demonstrate decreased fall risk.  Baseline:  19 secs with SPC Goal status: INITIAL   2.  Pt will Improve LEFS score to >65/80 to demonstrate improved QoL Baseline: 18/80 Goal status: INITIAL   3.  Pt will be able to ambulate Independently  with LRAD without LOB and pain in order to return to  Baseline: With [facility] and FWW Goal status: INITIAL   4.  Pt will score >50 on FOTO Baseline: 36/56 Goal status: INITIAL   5.  Pt will demonstrate 4/5 L hip strength without pain to improve stability and safety with all functional mobility in order to return to work.  Baseline: 2/5 grossly  Goal status: INITIAL         PLAN:   PT FREQUENCY: 2x/week   PT DURATION: 10 weeks   PLANNED INTERVENTIONS: Therapeutic exercises, Therapeutic activity, Neuromuscular re-education, Balance training, Gait training, Patient/Family education, Self Care, and Joint mobilization   PLAN FOR NEXT SESSION: Introduce stretches, Hip ROM exs, gait  training with FWW to normalize gait, balance activities.    Gertie Exon, PT 11/20/2022, 12:36 PM

## 2022-11-22 ENCOUNTER — Encounter: Payer: Self-pay | Admitting: Physical Therapy

## 2022-11-23 ENCOUNTER — Encounter: Payer: Self-pay | Admitting: Physical Therapy

## 2022-11-23 ENCOUNTER — Ambulatory Visit: Payer: Worker's Compensation | Admitting: Physical Therapy

## 2022-11-23 DIAGNOSIS — R2689 Other abnormalities of gait and mobility: Secondary | ICD-10-CM | POA: Diagnosis present

## 2022-11-23 DIAGNOSIS — R262 Difficulty in walking, not elsewhere classified: Secondary | ICD-10-CM | POA: Diagnosis present

## 2022-11-23 DIAGNOSIS — R29898 Other symptoms and signs involving the musculoskeletal system: Secondary | ICD-10-CM | POA: Diagnosis present

## 2022-11-23 DIAGNOSIS — Z96642 Presence of left artificial hip joint: Secondary | ICD-10-CM

## 2022-11-27 ENCOUNTER — Ambulatory Visit: Payer: Worker's Compensation | Admitting: Physical Therapy

## 2022-11-27 ENCOUNTER — Encounter: Payer: Self-pay | Admitting: Physical Therapy

## 2022-11-27 DIAGNOSIS — Z96642 Presence of left artificial hip joint: Secondary | ICD-10-CM | POA: Diagnosis not present

## 2022-11-27 DIAGNOSIS — R29898 Other symptoms and signs involving the musculoskeletal system: Secondary | ICD-10-CM

## 2022-11-27 DIAGNOSIS — R262 Difficulty in walking, not elsewhere classified: Secondary | ICD-10-CM

## 2022-11-27 DIAGNOSIS — R2689 Other abnormalities of gait and mobility: Secondary | ICD-10-CM

## 2022-11-27 NOTE — Therapy (Addendum)
OUTPATIENT PHYSICAL THERAPY TREATMENT NOTE   Patient Name: Christian Hughes MRN: [id number] DOB:09-Jun-1961, 62 y.o., male Today's Date: 11/27/2022  PCP: Dewaine Oats MD  REFERRING PROVIDER: Juanell Fairly MD   END OF SESSION:      Past Medical History:  Diagnosis Date   Engages in vaping    H/O ventral hernia    S/p of repair   Marijuana use    Orthostatic dizziness 10/08/2022   Past Surgical History:  Procedure Laterality Date   HIP ARTHROPLASTY Left 10/07/2022   Procedure: ARTHROPLASTY BIPOLAR HIP (HEMIARTHROPLASTY);  Surgeon: Juanell Fairly, MD;  Location: [facility] ORS;  Service: Orthopedics;  Laterality: Left;   Ventral hernia repair     Patient Active Problem List   Diagnosis Date Noted   Acute urinary retention 10/09/2022   Leukocytosis 10/08/2022   Closed left hip fracture, initial encounter (HCC) 10/06/2022   Fall 10/06/2022   Abnormal EKG 10/06/2022   Normocytic anemia 10/06/2022   Bone lesion_sclerotic lesions in the right iliac bone 10/06/2022   Elevated blood pressure reading 10/06/2022    REFERRING DIAG: W09.811 (ICD-10-CM) - Presence of left artificial hip joint   THERAPY DIAG:  History of left hip hemiarthroplasty  Weakness of left leg  Balance problems  Difficulty in walking, not elsewhere classified  Rationale for Evaluation and Treatment Rehabilitation  PERTINENT HISTORY: From initial eval: Christian Hughes is a 62 y.o. male without significant past medical history except for occasional vaping amd marijuana use, who presents with fall and left hip pain.  Pt states that he slipped and fell at work at about 11:00 AM.  Denies loss of consciousness.  He injured his left hip, causing left hip pain, which is constant, sharp, severe, nonradiating, aggravated by movement.  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    "My mom took care of me after my hip surgery.  I had therapy at home. I returned to my home alone after my first follow up appointment. I have pain in my buttocks, groin and the corner of my buttocks of about 2 to 3/10. I have high pain tolerance. I am unable to walk normal and I feel weak. I began to use this cane on my own after my PT at home stopped.  I can't wait to go to work because, I love to work."   PAIN:  Are you having pain? Yes: Pain location: 2-3/10 Pain description: stabbing Aggravating factors: Moving, walking, WB   Relieving factors: rest, OTC meds, ice    WEIGHT BEARING RESTRICTIONS: Yes:  WBAT   FALLS:  Has patient fallen in last 6 months? Yes. 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 Pt negotiates some small staircases in town. Pt has historically taken 24-packs of water bottles to his work for customers.    PLOF: Independent   PATIENT GOALS: "I want to return to work without hurting and falling as soon as I can."   NEXT MD VISIT: 11/22/2022    PRECAUTIONS: Posterior hip precautions  L hip hemiarthroplasty following L femoral neck fracture (DOS: 10/07/22)    SUBJECTIVE:                                                                                                                                                                                      SUBJECTIVE STATEMENT:  Patient reports some pain affecting L groin pain at arrival. Patient reports notable soreness after last visit. Patient reports using his SPC primarily. Patient reports he may have slept on his L hip wrong.    PAIN:  Are you having pain? 1.5/10 pain L groin   Pain  location: L groin, L posterolateral hip   OBJECTIVE: (objective measures completed at initial evaluation unless otherwise dated)   PATIENT SURVEYS:  LEFS 18/80 FOTO 36   COGNITION: Overall cognitive status: Within functional limits for tasks assessed and impulsive.                            SENSATION: WFL   MUSCLE LENGTH: deferred  test 2/2 to pain.    POSTURE: rounded shoulders   PALPATION: Pain in L sacral border, Iliac crest, Gluet med, TFL and greater trochanter     LOWER EXTREMITY ROM:    Active ROM Right eval Left eval  Hip flexion WFL 20 deg with pain  Hip extension [facility]    Hip abduction WFL 20 with pain   Hip adduction [facility]    Hip internal rotation [facility]    Hip external rotation [facility]    Knee flexion [facility]  Knee extension [facility]  Ankle dorsiflexion [facility]  Ankle plantarflexion [facility] WFL  Ankle inversion [facility]  Ankle eversion WFL WFL   (Blank rows = not tested)   LOWER EXTREMITY MMT:   MMT Right eval Left eval  Hip flexion WFL 2+  Hip extension [facility] 2  Hip abduction [facility] 2  Hip adduction [facility]    Hip internal rotation [facility]    Hip external rotation [facility]    Knee flexion WFL 3-  Knee extension WFl 3  Ankle dorsiflexion WFL 3+  Ankle plantarflexion WFL 4  Ankle inversion WFL 4  Ankle eversion WFL 3-   (Blank rows = not tested) Pt's L ankle remains inverted 10 degrees since the Surgery.     FUNCTIONAL TESTS:  30 seconds chair stand test: 5 reps completed Timed up and go (TUG): 19secs 10 meter walk test: 12secs= 1,2 m/secs             Balance Test : SL standing unable, Modified tandem 10 secs, Tandem Unable, Rhomberg with EO 5 secs and unable with  EC.  GAIT: Distance walked: 30 ft  Assistive device utilized: Single point cane Level of assistance: SBA Comments: Pt unsteady on feet with severe antalgic gait, decreased loading response, absent heel strike and absent toe off gait with uneven stride and decreased speed. Heavy reliance on the [facility]        TODAY'S TREATMENT:                                                                                                                              DATE: 11/27/2022     Manual Therapy - for symptom modulation, soft tissue sensitivity and mobility, joint mobility, ROM   In supine: Gentle L hip log rolling; 2 bouts of 30 seconds  Gentle L hip PROM within limits of posterior hip precautions; x 2 minutes  *not today* STM and IASTM with Theraband roller along L hip flexors, L proximal quads x 5 minutes   Therapeutic Exercise - for improved soft tissue flexibility and extensibility as needed for ROM, improved strength as needed to improve performance of CKC activities/functional movements  Adjusted patient's SPC for proper fit  -pt reports feeling comfortable with height of cane    Supine bent-knee clamshell; Green Tband; 2x10 Quad set; reviewed for [employer] set; reviewed for HEP SLR attempted   -groin pain and difficulty initiating lift  Bridge; 3x8, Green Tband   -verbal cueing and demo for technique  Seated knee extension, 5-lb ankle weights; 2x10 alternating R/L  // bars side stepping; x 4 D/B each for length of // bars  Toe tapping; x12 alternating RL with 6-inch step  -BUE support with progression to unilateral R UE support  Patient education: Discussed role of PT moving forward with progressive weightbearing activity and gradual restoration of gait and ability to perform CKC work      PATIENT EDUCATION:  Education details: Pt education about findings, rehab process, significance of fall prevention and use of FWW  and anatomy.  Person educated: Patient Education method: Medical illustrator Education comprehension: verbalized understanding   HOME EXERCISE PROGRAM: PT will begin HEP next treatment session meanwhile pt will continue with HEP package provided after THR. Package  Reviewed.      ASSESSMENT:   CLINICAL IMPRESSION: Patient is able to better  tolerate L hip PROM in supine position - he is still limited with degree of hip abduction and flexion tolerated. We are still observing posterior hip precautions. Pt demonstrates fair gait pattern with minimal antalgic pattern on surgical LE when walking with single-point cane. We progressed with low-impact isotonic and stepping activities in parallel bars today with pt having notable fatigue and moderate soreness, but pt overall tolerated Tx well. Patient has remaining deficits in ROM, mobility, and pain. Patient will benefit from continued skilled therapeutic intervention to address the above deficits as needed for improved function and QoL.      OBJECTIVE IMPAIRMENTS: Abnormal gait, decreased activity tolerance, decreased balance, decreased endurance, decreased knowledge  of condition, decreased knowledge of use of DME, decreased mobility, difficulty walking, decreased ROM, decreased strength, and dizziness.    ACTIVITY LIMITATIONS: carrying, lifting, bending, sitting, standing, squatting, sleeping, stairs, transfers, dressing, and locomotion level   PARTICIPATION LIMITATIONS: cleaning, shopping, community activity, occupation, and yard work   PERSONAL FACTORS:  Pt is impulsive and eager to get better so that he can return to work.   are also affecting patient's functional outcome.    REHAB POTENTIAL: Good   CLINICAL DECISION MAKING: Stable/uncomplicated   EVALUATION COMPLEXITY: Low     GOALS: Goals reviewed with patient? Yes   SHORT TERM GOALS: Target date: 12/22/2022 Pt will improve L hip ROM to 10 to 90 degrees without pain in order to promote normal gait and stability.  Baseline: 20 degree Hip felx and 0 deg Ext Goal status: INITIAL   2.  Pt will be able to ambulate 549ft safely with Cane without fall and pain . Baseline: 30 ft with SBA and heavy reliance on SPC Goal status: INITIAL       LONG TERM GOALS: Target date: 01/26/2023   Pt will Improve TUG time to <10secs without SPC  to demonstrate decreased fall risk.  Baseline: 19 secs with SPC Goal status: INITIAL   2.  Pt will Improve LEFS score to >65/80 to demonstrate improved QoL Baseline: 18/80 Goal status: INITIAL   3.  Pt will be able to ambulate Independently  with LRAD without LOB and pain in order to return to  Baseline: With [facility] and FWW Goal status: INITIAL   4.  Pt will score >50 on FOTO Baseline: 36/56 Goal status: INITIAL   5.  Pt will demonstrate 4/5 L hip strength without pain to improve stability and safety with all functional mobility in order to return to work.  Baseline: 2/5 grossly  Goal status: INITIAL         PLAN:   PT FREQUENCY: 2x/week   PT DURATION: 10 weeks   PLANNED INTERVENTIONS: Therapeutic exercises, Therapeutic activity, Neuromuscular re-education, Balance training, Gait training, Patient/Family education, Self Care, and Joint mobilization   PLAN FOR NEXT SESSION: Introduce stretches, Hip ROM exs, gait  training with FWW to normalize gait, balance activities.    Consuela Mimes, PT, DPT #[id number] Gertie Exon, PT 11/27/2022, 12:32 PM

## 2022-11-27 NOTE — Therapy (Signed)
OUTPATIENT PHYSICAL THERAPY TREATMENT NOTE   Patient Name: Christian Hughes MRN: [id number] DOB:[birth year], 62 y.o., male Today's Date: 11/23/2022  PCP: Dewaine Oats MD  REFERRING PROVIDER: Juanell Fairly MD   END OF SESSION:   PT End of Session - 11/23/22 1301     Visit Number 3    Number of Visits 20    Date for PT Re-Evaluation 12/22/22    PT Start Time 1301    PT Stop Time 1344    PT Time Calculation (min) 43 min    Activity Tolerance Patient tolerated treatment well;Patient limited by pain    Behavior During Therapy [facility] for tasks assessed/performed;Impulsive              Past Medical History:  Diagnosis Date   Engages in vaping    H/O ventral hernia    S/p of repair   Marijuana use    Orthostatic dizziness 10/08/2022   Past Surgical History:  Procedure Laterality Date   HIP ARTHROPLASTY Left 10/07/2022   Procedure: ARTHROPLASTY BIPOLAR HIP (HEMIARTHROPLASTY);  Surgeon: Juanell Fairly, MD;  Location: [facility] ORS;  Service: Orthopedics;  Laterality: Left;   Ventral hernia repair     Patient Active Problem List   Diagnosis Date Noted   Acute urinary retention 10/09/2022   Leukocytosis 10/08/2022   Closed left hip fracture, initial encounter 10/06/2022   Fall 10/06/2022   Abnormal EKG 10/06/2022   Normocytic anemia 10/06/2022   Bone lesion_sclerotic lesions in the right iliac bone 10/06/2022   Elevated blood pressure reading 10/06/2022    REFERRING DIAG: W09.811 (ICD-10-CM) - Presence of left artificial hip joint   THERAPY DIAG:  History of left hip hemiarthroplasty  Weakness of left leg  Balance problems  Difficulty in walking, not elsewhere classified  Rationale for Evaluation and Treatment Rehabilitation  PERTINENT HISTORY: From initial eval: Christian Hughes is a 62 y.o. male without significant past medical history except for occasional vaping amd marijuana use, who presents with fall and left hip pain.  Pt states that he slipped and fell at work at  about 11:00 AM.  Denies loss of consciousness.  He injured his left hip, causing left hip pain, which is constant, sharp, severe, nonradiating, aggravated by movement.  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    "My mom took care of me after my hip surgery. I had therapy at home. I returned to my home alone after my first follow up appointment. I have pain in my buttocks, groin and the corner of my buttocks of about 2 to 3/10. I have high pain tolerance. I am unable to walk normal and I feel weak. I began to use this cane on my own after my PT at home stopped.  I can't wait to go to work because, I love to work."   PAIN:  Are you having pain? Yes: Pain location: 2-3/10 Pain description: stabbing Aggravating factors: Moving, walking, WB   Relieving factors: rest, OTC meds, ice    WEIGHT BEARING RESTRICTIONS: Yes:  WBAT   FALLS:  Has patient fallen in last 6 months? Yes. 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 Pt negotiates some small staircases in town. Pt has historically taken 24-packs of water bottles to his work for customers.    PLOF: Independent   PATIENT GOALS: "I want to return to work without hurting and falling as soon as I can."   NEXT MD VISIT: 11/22/2022    PRECAUTIONS: Posterior hip precautions   L hip hemiarthroplasty following L femoral neck fracture (DOS: 10/07/22)    SUBJECTIVE:                                                                                                                                                                                       SUBJECTIVE STATEMENT:  Patient reports minimal soreness after last visit. His MD told him yesterday that he is not ready yet to go to work on light duty. Patient reports tolerating last visit well. Patient reports some mild groin discomfort and mild pain along L posterolateral hip. Pt arrives with [facility] bought from store today and is using this on R side.    PAIN:  Are you having pain? No pain at rest  Pain location: L groin, L posterolateral hip   OBJECTIVE: (objective measures completed at initial evaluation unless otherwise dated)   PATIENT SURVEYS:  LEFS 18/80 FOTO 36   COGNITION: Overall cognitive status: Within functional limits for tasks assessed and impulsive.                            SENSATION: WFL   MUSCLE LENGTH: deferred  test 2/2 to pain.    POSTURE: rounded shoulders   PALPATION: Pain in L sacral border, Iliac crest, Gluet med, TFL and greater trochanter     LOWER EXTREMITY ROM:    Active ROM Right eval Left eval  Hip flexion WFL 20 deg with pain  Hip extension [facility]    Hip abduction WFL 20 with pain   Hip adduction [facility]    Hip internal rotation [facility]    Hip external rotation [facility]    Knee flexion [facility]  Knee extension [facility]  Ankle dorsiflexion [facility]  Ankle plantarflexion [facility] WFL  Ankle inversion [facility]  Ankle eversion WFL WFL   (Blank rows = not tested)   LOWER EXTREMITY MMT:   MMT Right eval Left eval  Hip flexion WFL 2+  Hip extension [facility] 2  Hip abduction [facility] 2  Hip adduction [facility]    Hip internal rotation [facility]    Hip external rotation [facility]  Knee flexion WFL 3-  Knee extension [facility] 3  Ankle dorsiflexion WFL 3+  Ankle plantarflexion WFL 4  Ankle inversion WFL 4  Ankle eversion WFL 3-   (Blank rows = not tested) Pt's L ankle remains inverted 10 degrees since the Surgery.     FUNCTIONAL TESTS:  30 seconds chair stand test: 5 reps completed Timed up and go (TUG): 19secs 10  meter walk test: 12secs= 1,2 m/secs             Balance Test : SL standing unable, Modified tandem 10 secs, Tandem Unable, Rhomberg with EO 5 secs and unable with EC.  GAIT: Distance walked: 30 ft  Assistive device utilized: Single point cane Level of assistance: SBA Comments: Pt unsteady on feet with severe antalgic gait, decreased loading response, absent heel strike and absent toe off gait with uneven stride and decreased speed. Heavy reliance on the [facility]       TODAY'S TREATMENT:                                                                                                                              DATE: 11/23/2022     Manual Therapy - for symptom modulation, soft tissue sensitivity and mobility, joint mobility, ROM   In supine: Gentle L hip log rolling; 2 bouts of 30 seconds  Gentle L hip PROM within limits of posterior hip precautions; x 2 minutes  *not today* STM and IASTM with Theraband roller along L hip flexors, L proximal quads x 5 minutes   Therapeutic Exercise - for improved soft tissue flexibility and extensibility as needed for ROM, improved strength as needed to improve performance of CKC activities/functional movements  Adjusted patient's SPC for proper fit  -pt reports feeling comfortable with height of cane    Supine bent-knee clamshell; Red Tband; 2x10 Quad set; reviewed for HEP Glute set; reviewed for HEP SLR attempted   -groin pain and difficulty initiating lift  Bridge; 2x8  -verbal cueing and demo for technique  Seated knee extension, 4-lb ankle weights; 2x10 alternating R/L  // bars forward/backward stepping, side stepping; x 4 D/B each for length of // bars  Patient education: Discussed role of PT moving forward with progressive weightbearing activity and gradual restoration of gait and ability to perform CKC work      PATIENT EDUCATION:  Education details: Pt education about findings, rehab process, significance of fall prevention and use  of FWW  and anatomy.  Person educated: Patient Education method: Medical illustrator Education comprehension: verbalized understanding   HOME EXERCISE PROGRAM: PT will begin HEP next treatment session meanwhile pt will continue with HEP package provided after THR. Package  Reviewed.      ASSESSMENT:   CLINICAL IMPRESSION: Patient is able to better tolerate L hip PROM in supine position - he is still limited with degree of hip abduction and flexion tolerated. We are still observing posterior hip precautions. Pt demonstrates fair gait  pattern with minimal antalgic pattern on surgical LE when walking with single-point cane. We progressed with low-impact isotonic and stepping activities in parallel bars today with pt having notable fatigue and moderate soreness, but pt overall tolerated Tx well. Patient has remaining deficits in ROM, mobility, and pain. Patient will benefit from continued skilled therapeutic intervention to address the above deficits as needed for improved function and QoL.      OBJECTIVE IMPAIRMENTS: Abnormal gait, decreased activity tolerance, decreased balance, decreased endurance, decreased knowledge of condition, decreased knowledge of use of DME, decreased mobility, difficulty walking, decreased ROM, decreased strength, and dizziness.    ACTIVITY LIMITATIONS: carrying, lifting, bending, sitting, standing, squatting, sleeping, stairs, transfers, dressing, and locomotion level   PARTICIPATION LIMITATIONS: cleaning, shopping, community activity, occupation, and yard work   PERSONAL FACTORS:  Pt is impulsive and eager to get better so that he can return to work.   are also affecting patient's functional outcome.    REHAB POTENTIAL: Good   CLINICAL DECISION MAKING: Stable/uncomplicated   EVALUATION COMPLEXITY: Low     GOALS: Goals reviewed with patient? Yes   SHORT TERM GOALS: Target date: 12/22/2022 Pt will improve L hip ROM to 10 to 90 degrees without pain  in order to promote normal gait and stability.  Baseline: 20 degree Hip felx and 0 deg Ext Goal status: INITIAL   2.  Pt will be able to ambulate 520ft safely with Cane without fall and pain . Baseline: 30 ft with SBA and heavy reliance on SPC Goal status: INITIAL       LONG TERM GOALS: Target date: 01/26/2023   Pt will Improve TUG time to <10secs without SPC to demonstrate decreased fall risk.  Baseline: 19 secs with SPC Goal status: INITIAL   2.  Pt will Improve LEFS score to >65/80 to demonstrate improved QoL Baseline: 18/80 Goal status: INITIAL   3.  Pt will be able to ambulate Independently  with LRAD without LOB and pain in order to return to  Baseline: With [facility] and FWW Goal status: INITIAL   4.  Pt will score >50 on FOTO Baseline: 36/56 Goal status: INITIAL   5.  Pt will demonstrate 4/5 L hip strength without pain to improve stability and safety with all functional mobility in order to return to work.  Baseline: 2/5 grossly  Goal status: INITIAL         PLAN:   PT FREQUENCY: 2x/week   PT DURATION: 10 weeks   PLANNED INTERVENTIONS: Therapeutic exercises, Therapeutic activity, Neuromuscular re-education, Balance training, Gait training, Patient/Family education, Self Care, and Joint mobilization   PLAN FOR NEXT SESSION: Introduce stretches, Hip ROM exs, gait  training with FWW to normalize gait, balance activities.    Consuela Mimes, PT, DPT #[id number] Gertie Exon, PT 11/23/2022, 1:01 PM

## 2022-11-30 ENCOUNTER — Encounter: Payer: Self-pay | Admitting: Physical Therapy

## 2022-11-30 ENCOUNTER — Ambulatory Visit: Payer: Worker's Compensation | Attending: Orthopedic Surgery | Admitting: Physical Therapy

## 2022-11-30 DIAGNOSIS — R262 Difficulty in walking, not elsewhere classified: Secondary | ICD-10-CM | POA: Insufficient documentation

## 2022-11-30 DIAGNOSIS — R2689 Other abnormalities of gait and mobility: Secondary | ICD-10-CM | POA: Diagnosis present

## 2022-11-30 DIAGNOSIS — Z96642 Presence of left artificial hip joint: Secondary | ICD-10-CM | POA: Insufficient documentation

## 2022-11-30 DIAGNOSIS — R29898 Other symptoms and signs involving the musculoskeletal system: Secondary | ICD-10-CM | POA: Insufficient documentation

## 2022-11-30 NOTE — Therapy (Signed)
OUTPATIENT PHYSICAL THERAPY TREATMENT NOTE   Patient Name: Christian Hughes MRN: [id number] DOB:Apr 12, 1961, 62 y.o., male Today's Date: 11/30/2022  PCP: Dewaine Oats MD  REFERRING PROVIDER: Juanell Fairly MD   END OF SESSION:   PT End of Session - 11/30/22 1257     Visit Number 5    Number of Visits 20    Date for PT Re-Evaluation 12/22/22    PT Start Time 1302    PT Stop Time 1342    PT Time Calculation (min) 40 min    Activity Tolerance Patient tolerated treatment well;Patient limited by pain    Behavior During Therapy [facility] for tasks assessed/performed;Impulsive               Past Medical History:  Diagnosis Date   Engages in vaping    H/O ventral hernia    S/p of repair   Marijuana use    Orthostatic dizziness 10/08/2022   Past Surgical History:  Procedure Laterality Date   HIP ARTHROPLASTY Left 10/07/2022   Procedure: ARTHROPLASTY BIPOLAR HIP (HEMIARTHROPLASTY);  Surgeon: Juanell Fairly, MD;  Location: [facility] ORS;  Service: Orthopedics;  Laterality: Left;   Ventral hernia repair     Patient Active Problem List   Diagnosis Date Noted   Acute urinary retention 10/09/2022   Leukocytosis 10/08/2022   Closed left hip fracture, initial encounter (HCC) 10/06/2022   Fall 10/06/2022   Abnormal EKG 10/06/2022   Normocytic anemia 10/06/2022   Bone lesion_sclerotic lesions in the right iliac bone 10/06/2022   Elevated blood pressure reading 10/06/2022    REFERRING DIAG: W09.811 (ICD-10-CM) - Presence of left artificial hip joint   THERAPY DIAG:  History of left hip hemiarthroplasty  Weakness of left leg  Balance problems  Difficulty in walking, not elsewhere classified  Rationale for Evaluation and Treatment Rehabilitation  PERTINENT HISTORY: From initial eval: Christian Hughes is a 62 y.o. male without significant past medical history except for occasional vaping amd marijuana use, who presents with fall and left hip pain.  Pt states that he slipped and fell at  work at about 11:00 AM.  Denies loss of consciousness.  He injured his left hip, causing left hip pain, which is constant, sharp, severe, nonradiating, aggravated by movement.  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    "My mom took care of me after my hip surgery. I had therapy at home. I returned to my home alone after my first follow up appointment. I have pain in my buttocks, groin and the corner of my buttocks of about 2 to 3/10. I have high pain tolerance. I am unable to walk normal and I feel weak. I began to use this cane on my own after my PT at home stopped.  I can't wait to go to work because, I love to work."   PAIN:  Are you having pain? Yes: Pain location: 2-3/10 Pain description: stabbing Aggravating factors: Moving, walking, WB   Relieving factors: rest, OTC meds, ice    WEIGHT BEARING RESTRICTIONS: Yes:  WBAT   FALLS:  Has patient fallen in last 6 months? Yes. 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 Pt negotiates some small staircases in town. Pt has historically taken 24-packs of water bottles to his work for customers.    PLOF: Independent   PATIENT GOALS: "I want to return to work without hurting and falling as soon as I can."   NEXT MD VISIT: 11/22/2022    PRECAUTIONS: Posterior hip precautions   L hip hemiarthroplasty following L femoral neck fracture (DOS: 10/07/22)    SUBJECTIVE:                                                                                                                                                                                       SUBJECTIVE STATEMENT:  Patient reports some fatigue after last visit. He reports he hasn't completed a lot of activity this AM. He does not report significant symptoms at arrival. Next f/u with MD 01/01/23.    PAIN:  Are you having pain? 1.5/10 pain L groin   Pain location: L groin, L posterolateral hip   OBJECTIVE: (objective measures completed at initial evaluation unless otherwise dated)   PATIENT SURVEYS:  LEFS 18/80 FOTO 36   COGNITION: Overall cognitive status: Within functional limits for tasks assessed and impulsive.                            SENSATION: WFL   MUSCLE LENGTH: deferred  test 2/2 to pain.    POSTURE: rounded shoulders   PALPATION: Pain in L sacral border, Iliac crest, gluteus medius, TFL, and greater trochanter     LOWER EXTREMITY ROM:    Active ROM Right eval Left eval  Hip flexion WFL 20 deg with pain  Hip extension [facility]    Hip abduction WFL 20 with pain   Hip adduction [facility]    Hip internal rotation [facility]    Hip external rotation [facility]    Knee flexion [facility]  Knee extension [facility]  Ankle dorsiflexion [facility]  Ankle plantarflexion [facility] WFL  Ankle inversion [facility]  Ankle eversion WFL WFL   (Blank rows = not tested)   LOWER EXTREMITY MMT:   MMT Right eval Left eval  Hip flexion WFL 2+  Hip extension [facility] 2  Hip abduction [facility] 2  Hip adduction [facility]    Hip internal rotation [facility]    Hip external rotation [facility]    Knee flexion WFL 3-  Knee extension [facility] 3  Ankle dorsiflexion WFL 3+  Ankle plantarflexion WFL 4  Ankle inversion WFL 4  Ankle eversion WFL 3-   (Blank rows = not tested) Pt's L ankle remains inverted 10 degrees since the Surgery.     FUNCTIONAL TESTS:  30 seconds chair stand test: 5 reps completed Timed up and go (TUG): 19secs 10 meter walk test: 12secs= 1,2 m/secs             Balance Test : SL standing unable, Modified tandem 10 secs, Tandem Unable,  Rhomberg with EO 5 secs and unable with EC.  GAIT: Distance walked: 30 ft  Assistive device utilized: Single point cane Level of assistance: SBA Comments: Pt unsteady on feet with severe antalgic gait, decreased loading response, absent heel strike and absent toe off gait with uneven stride and decreased speed. Heavy reliance on the [facility]       TODAY'S TREATMENT:                                                                                                                              DATE: 11/30/2022    Manual Therapy - for symptom modulation, soft tissue sensitivity and mobility, joint mobility, ROM   *not today* In supine: Gentle L hip log rolling; 2 bouts of 30 seconds  Gentle L hip PROM within limits of posterior hip precautions; x 2 minutes  -Pt tolerates hip ER WNL, hip flexion to 80 deg, hip ABD to 20 deg STM and IASTM with Theraband roller along L hip flexors, L proximal quads x 5 minutes     Therapeutic Exercise - for improved soft tissue flexibility and extensibility as needed for ROM, improved strength as needed to improve performance of CKC activities/functional movements  Supine bent-knee clamshell; Blue Tband; 2x10  Bridge; 2x10 Blue Tband   -verbal cueing and demo for technique  Seated knee extension, 7.5-lb ankle weights; 2x10 alternating R/L  // bars side stepping; x 4 D/B each for length of // bars  Toe tapping; x12 alternating RL with 6-inch step  -BUE support with progression to unilateral R UE support  Minisquat; 2x10  Patient education: Discussed continued POC.    *not today* SLR attempted   -groin pain and difficulty initiating lift  Quad set; reviewed for HEP Glute set; reviewed for HEP    PATIENT EDUCATION:  Education details: Pt education about findings, rehab process, significance of fall prevention and use of FWW  and anatomy.  Person educated: Patient Education method: Medical illustrator Education comprehension: verbalized  understanding   HOME EXERCISE PROGRAM: HEP package obtained following THR.      ASSESSMENT:   CLINICAL IMPRESSION: We are able to emphasize therapeutic exercise and functional activities in clinic versus heavy focus on manual therapy. Pt is still notably challenged with full weight shift onto LLE. He fortunately has low NPRS and has made fair progress with activities tolerated. Patient has remaining deficits in hip ROM, LLE strength, LLE weightbearing, gait changes/antalgic pattern, and pain (intermittently affecting L groin and/or L posterolateral hip/gluteus medius region). Patient  will benefit from continued skilled therapeutic intervention to address the above deficits as needed for improved function and QoL.      OBJECTIVE IMPAIRMENTS: Abnormal gait, decreased activity tolerance, decreased balance, decreased endurance, decreased knowledge of condition, decreased knowledge of use of DME, decreased mobility, difficulty walking, decreased ROM, decreased strength, and dizziness.    ACTIVITY LIMITATIONS: carrying, lifting, bending, sitting, standing, squatting, sleeping, stairs, transfers, dressing, and locomotion level   PARTICIPATION LIMITATIONS: cleaning, shopping, community activity, occupation, and yard work   PERSONAL FACTORS:  Pt is impulsive and eager to get better so that he can return to work.   are also affecting patient's functional outcome.    REHAB POTENTIAL: Good   CLINICAL DECISION MAKING: Stable/uncomplicated   EVALUATION COMPLEXITY: Low     GOALS: Goals reviewed with patient? Yes   SHORT TERM GOALS: Target date: 12/22/2022 Pt will improve L hip ROM to 10 to 90 degrees without pain in order to promote normal gait and stability.  Baseline: 20 degree Hip felx and 0 deg Ext Goal status: INITIAL   2.  Pt will be able to ambulate 522ft safely with Cane without fall and pain . Baseline: 30 ft with SBA and heavy reliance on SPC Goal status: INITIAL       LONG TERM  GOALS: Target date: 01/26/2023   Pt will Improve TUG time to <10secs without SPC to demonstrate decreased fall risk.  Baseline: 19 secs with SPC Goal status: INITIAL   2.  Pt will Improve LEFS score to >65/80 to demonstrate improved QoL Baseline: 18/80 Goal status: INITIAL   3.  Pt will be able to ambulate Independently  with LRAD without LOB and pain in order to return to  Baseline: With [facility] and FWW Goal status: INITIAL   4.  Pt will score >50 on FOTO Baseline: 36/56 Goal status: INITIAL   5.  Pt will demonstrate 4/5 L hip strength without pain to improve stability and safety with all functional mobility in order to return to work.  Baseline: 2/5 grossly  Goal status: INITIAL         PLAN:   PT FREQUENCY: 2x/week   PT DURATION: 10 weeks   PLANNED INTERVENTIONS: Therapeutic exercises, Therapeutic activity, Neuromuscular re-education, Balance training, Gait training, Patient/Family education, Self Care, and Joint mobilization   PLAN FOR NEXT SESSION: Progressive gait and stepping activity with gradual AD wean, weight shifting work to improve standing tolerance on surgical LE, progressive LE strengthening. Manual therapy as needed for anterior hip and/or posterolateral hip pain.    Consuela Mimes, PT, DPT #[id number] Gertie Exon, PT 11/30/2022, 1:02 PM

## 2022-12-01 ENCOUNTER — Other Ambulatory Visit: Payer: Self-pay

## 2022-12-04 ENCOUNTER — Ambulatory Visit: Payer: Worker's Compensation | Admitting: Physical Therapy

## 2022-12-04 DIAGNOSIS — R262 Difficulty in walking, not elsewhere classified: Secondary | ICD-10-CM

## 2022-12-04 DIAGNOSIS — Z96642 Presence of left artificial hip joint: Secondary | ICD-10-CM | POA: Diagnosis not present

## 2022-12-04 DIAGNOSIS — R2689 Other abnormalities of gait and mobility: Secondary | ICD-10-CM

## 2022-12-04 DIAGNOSIS — R29898 Other symptoms and signs involving the musculoskeletal system: Secondary | ICD-10-CM

## 2022-12-04 NOTE — Therapy (Unsigned)
OUTPATIENT PHYSICAL THERAPY TREATMENT NOTE/GOAL UPDATE   Patient Name: Christian Hughes MRN: [id number] DOB:1960/08/14, 62 y.o., male Today's Date: 12/04/2022  PCP: Dewaine Oats MD  REFERRING PROVIDER: Juanell Fairly MD   END OF SESSION:   PT End of Session - 12/04/22 1518     Visit Number 6    Number of Visits 20    Date for PT Re-Evaluation 12/22/22    PT Start Time 1500    PT Stop Time 1543    PT Time Calculation (min) 43 min    Activity Tolerance Patient tolerated treatment well;Patient limited by pain    Behavior During Therapy [facility] for tasks assessed/performed;Impulsive              Past Medical History:  Diagnosis Date   Engages in vaping    H/O ventral hernia    S/p of repair   Marijuana use    Orthostatic dizziness 10/08/2022   Past Surgical History:  Procedure Laterality Date   HIP ARTHROPLASTY Left 10/07/2022   Procedure: ARTHROPLASTY BIPOLAR HIP (HEMIARTHROPLASTY);  Surgeon: Juanell Fairly, MD;  Location: [facility] ORS;  Service: Orthopedics;  Laterality: Left;   Ventral hernia repair     Patient Active Problem List   Diagnosis Date Noted   Acute urinary retention 10/09/2022   Leukocytosis 10/08/2022   Closed left hip fracture, initial encounter (HCC) 10/06/2022   Fall 10/06/2022   Abnormal EKG 10/06/2022   Normocytic anemia 10/06/2022   Bone lesion_sclerotic lesions in the right iliac bone 10/06/2022   Elevated blood pressure reading 10/06/2022    REFERRING DIAG: N82.956 (ICD-10-CM) - Presence of left artificial hip joint   THERAPY DIAG:  History of left hip hemiarthroplasty  Weakness of left leg  Balance problems  Difficulty in walking, not elsewhere classified  Rationale for Evaluation and Treatment Rehabilitation  PERTINENT HISTORY: From initial eval: Christian Hughes is a 63 y.o. male without significant past medical history except for occasional vaping amd marijuana use, who presents with fall and left hip pain.  Pt states that he slipped and  fell at work at about 11:00 AM.  Denies loss of consciousness.  He injured his left hip, causing left hip pain, which is constant, sharp, severe, nonradiating, aggravated by movement.  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    "My mom took care of me after my hip surgery. I had therapy at home. I returned to my home alone after my first follow up appointment. I have pain in my buttocks, groin and the corner of my buttocks of about 2 to 3/10. I have high pain tolerance. I am unable to walk normal and I feel weak. I began to use this cane on my own after my PT at home stopped.  I can't wait to go to work because, I love to work."   PAIN:  Are you having pain? Yes: Pain location: 2-3/10 Pain description: stabbing Aggravating factors: Moving, walking, WB   Relieving factors: rest, OTC meds, ice    WEIGHT BEARING RESTRICTIONS: Yes:  WBAT   FALLS:  Has patient fallen in last 6 months? Yes. 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 Pt negotiates some small staircases in town. Pt has historically taken 24-packs of water bottles to his work for customers.    PLOF: Independent   PATIENT GOALS: "I want to return to work without hurting and falling as soon as I can."   NEXT MD VISIT: 11/22/2022    PRECAUTIONS: Posterior hip precautions   L hip hemiarthroplasty following L femoral neck fracture (DOS: 10/07/22)    SUBJECTIVE:                                                                                                                                                                                       SUBJECTIVE STATEMENT:  Patient reports minimal soreness after last visit. He reports pain primarily affecting L posterolateral glutes at arrival. He feels that he is making good progress to date. He does have intermittent pain with prolonged weightbearing. Pain as remained on low end of NPRS largely. 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    PAIN:  Are you having pain? 1.5-2/10 pain L groin   Pain location: L groin, L posterolateral hip   OBJECTIVE: (objective measures completed at initial evaluation unless otherwise dated)   PATIENT SURVEYS:  LEFS 18/80 FOTO 36   COGNITION: Overall cognitive status: Within functional limits for tasks assessed and impulsive.                            SENSATION: WFL   MUSCLE LENGTH: deferred  test 2/2 to pain.    POSTURE: rounded shoulders   PALPATION: Pain in L sacral border, Iliac crest, gluteus medius, TFL, and greater trochanter     LOWER EXTREMITY ROM:    Active ROM Right eval Left eval  Hip flexion WFL 20 deg with pain  Hip extension [facility]    Hip abduction WFL 20 with pain   Hip adduction [facility]    Hip internal rotation [facility]    Hip external rotation [facility]    Knee flexion [facility]  Knee extension [facility]  Ankle dorsiflexion [facility]  Ankle plantarflexion [facility]  Ankle inversion [facility]  Ankle eversion WFL WFL   (Blank rows = not tested)   LOWER EXTREMITY MMT:   MMT Right eval Left eval Left 12/04/22  Hip flexion WFL 2+ 4*  Hip extension [facility] 2 3+  Hip  abduction [facility] 2 4-  Hip adduction [facility]     Hip internal rotation [facility]     Hip external rotation [facility]     Knee flexion WFL 3- 3+  Knee extension WFl 3 4+  Ankle dorsiflexion WFL 3+   Ankle plantarflexion WFL 4   Ankle inversion WFL 4   Ankle eversion WFL 3-     (Blank rows = not tested) Pt's L ankle remains inverted 10 degrees since the Surgery.     FUNCTIONAL TESTS:  30 seconds chair stand test: 5 reps completed Timed up and go (TUG): 19secs 10 meter walk test: 12secs= 1,2 m/secs             Balance Test : SL standing unable, Modified tandem 10 secs, Tandem Unable, Rhomberg with EO 5 secs and unable with EC.  GAIT: Distance walked: 30 ft  Assistive device utilized: Single point cane Level of assistance: SBA Comments: Pt unsteady on feet with severe antalgic gait, decreased loading response, absent heel strike and absent toe off gait with uneven stride and decreased speed. Heavy reliance on the [facility]       TODAY'S TREATMENT:                                                                                                                              DATE: 12/04/2022    Manual Therapy - for symptom modulation, soft tissue sensitivity and mobility, joint mobility, ROM    STM and IASTM with Hypervolt along L gluteus medius/maximus; x 5 minutes   -limited tolerance with direct oscillation of Hypervolt adjacent to greater trochanter   *not today* In supine: Gentle L hip log rolling; 2 bouts of 30 seconds  Gentle L hip PROM within limits of posterior hip precautions; x 2 minutes  -Pt tolerates hip ER WNL, hip flexion to 80 deg, hip ABD to 20 deg STM and IASTM with Theraband roller along L hip flexors, L proximal quads x 5 minutes    Cold pack (unbilled) - for anti-inflammatory and analgesic effect as needed for reduced pain and improved ability to participate in active PT intervention, along L hip with pt in R sidelying, x 5 minutes     Therapeutic Exercise - for improved soft tissue flexibility and extensibility as needed for ROM, improved strength as needed to improve performance of CKC activities/functional movements  *GOAL UPDATE PERFORMED   Supine bent-knee clamshell; Blue Tband; 2x10  Bridge; 2x10 Blue Tband   -verbal cueing  and demo for technique  // bars side stepping; x 4 D/B each for length of // bars  Toe tapping; x12 alternating RL with 6-inch step  -BUE support with progression to unilateral R UE support  Minisquat; 2x10  Patient education: Discussed continued POC, progress toward goals, role of PT moving forward with expected progression of weightbearing activity and AD wean.   *not today* Seated knee extension, 7.5-lb ankle weights; 2x10 alternating R/L SLR attempted   -  groin pain and difficulty initiating lift  Quad set; reviewed for HEP Glute set; reviewed for HEP    PATIENT EDUCATION:  Education details: Pt education about findings, rehab process, significance of fall prevention and use of FWW  and anatomy.  Person educated: Patient Education method: Medical illustrator Education comprehension: verbalized understanding   HOME EXERCISE PROGRAM: HEP package obtained following THR.      ASSESSMENT:   CLINICAL IMPRESSION: Patient has notable sensitivity along L greater trochanter and may have some irritation of trochanteric bursa given limited tolerance of direct pressure onto greater trochanter (bony healing is expected at > 8 wk timeframe following ORIF). We discussed use of ice at home for topical anti-inflammatory effect and pain relief. Pt fortunately has low NPRS mostly, but he does still have some hip weakness and tolerates limited volume of weightbearing. Pt has to drive and walk throughout local community following his return to work. Pt has weaned from walker down to single-point cane; he uses his cane for all ambulation presently. Pt has notably improved ROM, TUG duration, ambulation with LRAD, and strength testing compared to IE. He is still functionally limited with independent community mobility and will need to wean down from his [facility] and work further on strengthening and progressive weightbearing activity for full return to prior level of function. Patient has remaining  deficits in hip ROM, LLE strength, LLE weightbearing, gait changes/antalgic pattern, and pain (intermittently affecting L groin and/or L posterolateral hip/gluteus medius region). Patient will benefit from continued skilled therapeutic intervention to address the above deficits as needed for improved function and QoL.      OBJECTIVE IMPAIRMENTS: Abnormal gait, decreased activity tolerance, decreased balance, decreased endurance, decreased knowledge of condition, decreased knowledge of use of DME, decreased mobility, difficulty walking, decreased ROM, decreased strength, and dizziness.    ACTIVITY LIMITATIONS: carrying, lifting, bending, sitting, standing, squatting, sleeping, stairs, transfers, dressing, and locomotion level   PARTICIPATION LIMITATIONS: cleaning, shopping, community activity, occupation, and yard work   PERSONAL FACTORS:  Pt is impulsive and eager to get better so that he can return to work.   are also affecting patient's functional outcome.    REHAB POTENTIAL: Good   CLINICAL DECISION MAKING: Stable/uncomplicated   EVALUATION COMPLEXITY: Low     GOALS: Goals reviewed with patient? Yes   SHORT TERM GOALS: Target date: 12/22/2022 Pt will improve L hip ROM to 10 to 90 degrees without pain in order to promote normal gait and stability.  Baseline: 20 degree Hip felx and 0 deg Ext 12/04/22: 90 deg hip flex and 5 deg EXT Goal status: PARTIALLY MET    2.  Pt will be able to ambulate 563ft safely with Cane without fall and pain . Baseline: 30 ft with SBA and heavy reliance on [facility].   12/04/22: Pt uses SPC for primary means of mobility in home and community at this time.  Goal status: ACHIEVED       LONG TERM GOALS: Target date: 01/26/2023   Pt will Improve TUG time to <10secs without SPC to demonstrate decreased fall risk.  Baseline: 19 secs with SPC 12/04/22: 14.6 seconds with SPC Goal status: IN PROGRESS   2.  Pt will Improve LEFS score to >65/80 to demonstrate improved  QoL Baseline: 18/80 12/04/22: 20/80  Goal status: NOT MET    3.  Pt will be able to ambulate Independently  with LRAD without LOB and pain in order to return to community level mobility safely  Baseline: With [facility] and  FWW.   12/04/22: ModI ambulation with SPC/LRAD with mild pain at longer distances, no LOB Goal status: IN PROGRESS    4.  Pt will score >50 on FOTO Baseline: 36/56 12/04/22: 35/56 Goal status: NOT MET    5.  Pt will demonstrate 4/5 L hip strength without pain to improve stability and safety with all functional mobility in order to return to work.  Baseline: 2/5 grossly    12/04/22: 4-/5 grossly Goal status: IN PROGRESS          PLAN:   PT FREQUENCY: 2x/week   PT DURATION: 8 weeks   PLANNED INTERVENTIONS: Therapeutic exercises, Therapeutic activity, Neuromuscular re-education, Balance training, Gait training, Patient/Family education, Self Care, and Joint mobilization   PLAN FOR NEXT SESSION: Progressive gait and stepping activity with gradual AD wean, weight shifting work to improve standing tolerance on surgical LE, progressive LE strengthening. Manual therapy as needed for anterior hip and/or posterolateral hip pain.    Consuela Mimes, PT, DPT #[id number] Gertie Exon, PT 12/04/2022, 3:19 PM

## 2022-12-06 ENCOUNTER — Encounter: Payer: Self-pay | Admitting: Physical Therapy

## 2022-12-07 ENCOUNTER — Ambulatory Visit: Payer: Worker's Compensation | Admitting: Physical Therapy

## 2022-12-07 DIAGNOSIS — R2689 Other abnormalities of gait and mobility: Secondary | ICD-10-CM

## 2022-12-07 DIAGNOSIS — R29898 Other symptoms and signs involving the musculoskeletal system: Secondary | ICD-10-CM

## 2022-12-07 DIAGNOSIS — Z96642 Presence of left artificial hip joint: Secondary | ICD-10-CM

## 2022-12-07 DIAGNOSIS — R262 Difficulty in walking, not elsewhere classified: Secondary | ICD-10-CM

## 2022-12-07 NOTE — Therapy (Signed)
OUTPATIENT PHYSICAL THERAPY TREATMENT NOTE   Patient Name: Christian Hughes MRN: [id number] DOB:June 24, 1961, 62 y.o., male Today's Date: 12/07/2022  PCP: Dewaine Oats MD  REFERRING PROVIDER: Juanell Fairly MD   END OF SESSION:   PT End of Session - 12/07/22 1348     Visit Number 7    Number of Visits 20    Date for PT Re-Evaluation 12/22/22    PT Start Time 1345    PT Stop Time 1428    PT Time Calculation (min) 43 min    Activity Tolerance Patient tolerated treatment well;Patient limited by pain    Behavior During Therapy [facility] for tasks assessed/performed;Impulsive               Past Medical History:  Diagnosis Date   Engages in vaping    H/O ventral hernia    S/p of repair   Marijuana use    Orthostatic dizziness 10/08/2022   Past Surgical History:  Procedure Laterality Date   HIP ARTHROPLASTY Left 10/07/2022   Procedure: ARTHROPLASTY BIPOLAR HIP (HEMIARTHROPLASTY);  Surgeon: Juanell Fairly, MD;  Location: [facility] ORS;  Service: Orthopedics;  Laterality: Left;   Ventral hernia repair     Patient Active Problem List   Diagnosis Date Noted   Acute urinary retention 10/09/2022   Leukocytosis 10/08/2022   Closed left hip fracture, initial encounter (HCC) 10/06/2022   Fall 10/06/2022   Abnormal EKG 10/06/2022   Normocytic anemia 10/06/2022   Bone lesion_sclerotic lesions in the right iliac bone 10/06/2022   Elevated blood pressure reading 10/06/2022    REFERRING DIAG: W09.811 (ICD-10-CM) - Presence of left artificial hip joint   THERAPY DIAG:  History of left hip hemiarthroplasty  Weakness of left leg  Balance problems  Difficulty in walking, not elsewhere classified  Rationale for Evaluation and Treatment Rehabilitation  PERTINENT HISTORY: From initial eval: Christian Hughes is a 61 y.o. male without significant past medical history except for occasional vaping amd marijuana use, who presents with fall and left hip pain.  Pt states that he slipped and fell at  work at about 11:00 AM.  Denies loss of consciousness.  He injured his left hip, causing left hip pain, which is constant, sharp, severe, nonradiating, aggravated by movement.  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    "My mom took care of me after my hip surgery. I had therapy at home. I returned to my home alone after my first follow up appointment. I have pain in my buttocks, groin and the corner of my buttocks of about 2 to 3/10. I have high pain tolerance. I am unable to walk normal and I feel weak. I began to use this cane on my own after my PT at home stopped.  I can't wait to go to work because, I love to work."   PAIN:  Are you having pain? Yes: Pain location: 2-3/10 Pain description: stabbing Aggravating factors: Moving, walking, WB   Relieving factors: rest, OTC meds, ice    WEIGHT BEARING RESTRICTIONS: Yes:  WBAT   FALLS:  Has patient fallen in last 6 months? Yes. 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 Pt negotiates some small staircases in town. Pt has historically taken 24-packs of water bottles to his work for customers.    PLOF: Independent   PATIENT GOALS: "I want to return to work without hurting and falling as soon as I can."   NEXT MD VISIT: 11/22/2022    PRECAUTIONS: Posterior hip precautions   L hip hemiarthroplasty following L femoral neck fracture (DOS: 10/07/22)    SUBJECTIVE:                                                                                                                                                                                       SUBJECTIVE STATEMENT:  Patient reports some lateral hip discomfort. Patient reports trying to walk without his cane and he is hoping to gradually wean down from it. He arrives without [facility] today.    PAIN:  Are you having pain? 1.5-2/10 pain L groin   Pain location: L groin, L posterolateral hip   OBJECTIVE: (objective measures completed at initial evaluation unless otherwise dated)   PATIENT SURVEYS:  LEFS 18/80 FOTO 36   COGNITION: Overall cognitive status: Within functional limits for tasks assessed and impulsive.                            SENSATION: WFL   MUSCLE LENGTH: deferred  test 2/2 to pain.    POSTURE: rounded shoulders   PALPATION: Pain in L sacral border, Iliac crest, gluteus medius, TFL, and greater trochanter     LOWER EXTREMITY ROM:    Active ROM Right eval Left eval  Hip flexion WFL 20 deg with pain  Hip extension [facility]    Hip abduction WFL 20 with pain   Hip adduction [facility]    Hip internal rotation [facility]    Hip external rotation [facility]    Knee flexion [facility]  Knee extension [facility]  Ankle dorsiflexion [facility]  Ankle plantarflexion [facility]  Ankle inversion [facility]  Ankle eversion WFL WFL   (Blank rows = not tested)   LOWER EXTREMITY MMT:   MMT Right eval Left eval Left 12/04/22  Hip flexion WFL 2+ 4*  Hip extension [facility] 2 3+  Hip abduction [facility] 2 4-  Hip adduction [facility]     Hip internal rotation [facility]     Hip external rotation [facility]     Knee flexion WFL 3- 3+  Knee extension WFl 3 4+  Ankle dorsiflexion WFL 3+  Ankle plantarflexion WFL 4   Ankle inversion WFL 4   Ankle eversion WFL 3-    (Blank rows = not tested) Pt's L ankle remains inverted 10 degrees since the Surgery.     FUNCTIONAL TESTS:  30 seconds chair stand test: 5 reps completed Timed up and go (TUG): 19secs 10 meter walk test: 12secs= 1,2 m/secs             Balance Test : SL standing unable, Modified tandem 10  secs, Tandem Unable, Rhomberg with EO 5 secs and unable with EC.  GAIT: Distance walked: 30 ft  Assistive device utilized: Single point cane Level of assistance: SBA Comments: Pt unsteady on feet with severe antalgic gait, decreased loading response, absent heel strike and absent toe off gait with uneven stride and decreased speed. Heavy reliance on the [facility]       TODAY'S TREATMENT:                                                                                                                              DATE: 12/07/2022     Pt presents with moderate compensated Trendelenburg with lateral flexion toward L with L stance phase, mild antalgic pattern    Therapeutic Exercise - for improved soft tissue flexibility and extensibility as needed for ROM, improved strength as needed to improve performance of CKC activities/functional movements   Supine bent-knee clamshell; Blue Tband; 2x10  Bridge; 2x12;  Blue Tband   -verbal cueing and demo for technique  3-way hip; x 6 ea dir; bilat LE; single hand on treadmill armrest for balance  Sit to stand; 2x10 from low table   // bars side stepping; x 4 D/B each for length of // bars  Toe tapping; 2x10 alternating RL with 6-inch step  -BUE support with progression to unilateral R UE support  Partial squat, 0-60 deg knee flexion; 2x10   Patient education: Discussed safe gait in home without AD and having AD readily available for community-level gait.   *not today* Seated knee extension, 7.5-lb ankle weights; 2x10 alternating R/L SLR attempted   -groin pain and difficulty initiating lift  Quad set; reviewed for HEP Glute set; reviewed for HEP    Manual Therapy - for symptom modulation, soft tissue sensitivity and mobility, joint mobility, ROM    *not today* STM and IASTM with Hypervolt along L gluteus medius/maximus; x 5 minutes   -limited tolerance with direct oscillation of Hypervolt adjacent to greater trochanter In  supine: Gentle L hip log rolling; 2 bouts of 30 seconds  Gentle L hip PROM within limits of posterior hip precautions; x 2 minutes  -Pt tolerates hip ER WNL, hip flexion to 80 deg, hip ABD to 20 deg STM and IASTM with Theraband roller along L hip flexors, L proximal quads x 5 minutes Cold pack (unbilled) - for anti-inflammatory and analgesic effect as needed for reduced pain and improved ability to participate in active PT intervention, along  L hip with pt in R sidelying, x 5 minutes     PATIENT EDUCATION:  Education details: Pt education about findings, rehab process, significance of fall prevention and use of FWW  and anatomy.  Person educated: Patient Education method: Medical illustrator Education comprehension: verbalized understanding   HOME EXERCISE PROGRAM: HEP package obtained following THR.      ASSESSMENT:   CLINICAL IMPRESSION: Patient has made good progress to date and is able to ambulate at community level with [facility] with minimal gait deviation. He does still have apparent gait deviations (noted above) with ambulation with no AD. Pt can safely negotiate home-level distance without AD, though moderate deviation persists. He participates very well with PT and is notably challenged with new weightbearing drills added. Patient has remaining deficits in hip ROM, LLE strength, LLE weightbearing, gait changes/antalgic pattern, and pain (intermittently affecting L groin and/or L posterolateral hip/gluteus medius region). Patient will benefit from continued skilled therapeutic intervention to address the above deficits as needed for improved function and QoL.      OBJECTIVE IMPAIRMENTS: Abnormal gait, decreased activity tolerance, decreased balance, decreased endurance, decreased knowledge of condition, decreased knowledge of use of DME, decreased mobility, difficulty walking, decreased ROM, decreased strength, and dizziness.    ACTIVITY LIMITATIONS: carrying, lifting,  bending, sitting, standing, squatting, sleeping, stairs, transfers, dressing, and locomotion level   PARTICIPATION LIMITATIONS: cleaning, shopping, community activity, occupation, and yard work   PERSONAL FACTORS:  Pt is impulsive and eager to get better so that he can return to work.   are also affecting patient's functional outcome.    REHAB POTENTIAL: Good   CLINICAL DECISION MAKING: Stable/uncomplicated   EVALUATION COMPLEXITY: Low     GOALS: Goals reviewed with patient? Yes   SHORT TERM GOALS: Target date: 12/22/2022 Pt will improve L hip ROM to 10 to 90 degrees without pain in order to promote normal gait and stability.  Baseline: 20 degree Hip felx and 0 deg Ext 12/04/22: 90 deg hip flex and 5 deg EXT Goal status: PARTIALLY MET    2.  Pt will be able to ambulate 529ft safely with Cane without fall and pain . Baseline: 30 ft with SBA and heavy reliance on [facility]   12/04/22: Pt uses SPC for primary means of mobility in home and community at this time.  Goal status: ACHIEVED       LONG TERM GOALS: Target date: 01/26/2023   Pt will Improve TUG time to <10secs without SPC to demonstrate decreased fall risk.  Baseline: 19 secs with SPC 12/04/22: 14.6 seconds with SPC Goal status: IN PROGRESS   2.  Pt will Improve LEFS score to >65/80 to demonstrate improved QoL Baseline: 18/80 12/04/22: 20/80  Goal status: NOT MET    3.  Pt will be able to ambulate Independently  with LRAD without LOB and pain in order to return to community level mobility safely  Baseline: With [facility] and FWW.   12/04/22: ModI ambulation with SPC/LRAD with mild pain at longer distances, no LOB Goal status: IN PROGRESS    4.  Pt will score >50 on FOTO Baseline: 36/56 12/04/22: 35/56 Goal status: NOT MET    5.  Pt will demonstrate 4/5 L hip strength without pain to improve stability and safety with all functional mobility in order to return to work.  Baseline: 2/5 grossly    12/04/22: 4-/5 grossly Goal status: IN  PROGRESS          PLAN:   PT FREQUENCY:  2x/week   PT DURATION: 8 weeks   PLANNED INTERVENTIONS: Therapeutic exercises, Therapeutic activity, Neuromuscular re-education, Balance training, Gait training, Patient/Family education, Self Care, and Joint mobilization   PLAN FOR NEXT SESSION: Progressive gait and stepping activity with gradual AD wean, weight shifting work to improve standing tolerance on surgical LE, progressive LE strengthening. Manual therapy as needed for anterior hip and/or posterolateral hip pain.    Consuela Mimes, PT, DPT #[id number] Gertie Exon, PT 12/07/2022, 1:48 PM

## 2022-12-09 DIAGNOSIS — R262 Difficulty in walking, not elsewhere classified: Secondary | ICD-10-CM | POA: Diagnosis present

## 2022-12-09 DIAGNOSIS — R2689 Other abnormalities of gait and mobility: Secondary | ICD-10-CM | POA: Diagnosis present

## 2022-12-09 DIAGNOSIS — Z96642 Presence of left artificial hip joint: Secondary | ICD-10-CM | POA: Diagnosis present

## 2022-12-09 DIAGNOSIS — R29898 Other symptoms and signs involving the musculoskeletal system: Secondary | ICD-10-CM | POA: Diagnosis present

## 2022-12-11 ENCOUNTER — Ambulatory Visit: Payer: Worker's Compensation | Admitting: Physical Therapy

## 2022-12-11 ENCOUNTER — Encounter: Payer: Self-pay | Admitting: Physical Therapy

## 2022-12-11 VITALS — BP 148/97 | HR 82

## 2022-12-11 DIAGNOSIS — R262 Difficulty in walking, not elsewhere classified: Secondary | ICD-10-CM

## 2022-12-11 DIAGNOSIS — R29898 Other symptoms and signs involving the musculoskeletal system: Secondary | ICD-10-CM

## 2022-12-11 DIAGNOSIS — Z96642 Presence of left artificial hip joint: Secondary | ICD-10-CM

## 2022-12-11 DIAGNOSIS — R2689 Other abnormalities of gait and mobility: Secondary | ICD-10-CM

## 2022-12-11 NOTE — Therapy (Unsigned)
OUTPATIENT PHYSICAL THERAPY TREATMENT NOTE   Patient Name: Christian Hughes MRN: [id number] DOB:12-24-1960, 62 y.o., male Today's Date: 12/11/2022  PCP: Dewaine Oats MD  REFERRING PROVIDER: Juanell Fairly MD   END OF SESSION:   PT End of Session - 12/11/22 1554     Visit Number 8    Number of Visits 20    Date for PT Re-Evaluation 12/22/22    PT Start Time 1548    PT Stop Time 1628    PT Time Calculation (min) 40 min    Activity Tolerance Patient tolerated treatment well;Patient limited by pain    Behavior During Therapy [facility] for tasks assessed/performed;Impulsive                Past Medical History:  Diagnosis Date   Christian Hughes    H/O ventral hernia    S/p of repair   Marijuana use    Orthostatic dizziness 10/08/2022   Past Surgical History:  Procedure Laterality Date   HIP ARTHROPLASTY Left 10/07/2022   Procedure: ARTHROPLASTY BIPOLAR HIP (HEMIARTHROPLASTY);  Surgeon: Juanell Fairly, MD;  Location: [facility] ORS;  Service: Orthopedics;  Laterality: Left;   Ventral hernia repair     Patient Active Problem List   Diagnosis Date Noted   Acute urinary retention 10/09/2022   Leukocytosis 10/08/2022   Closed left hip fracture, initial encounter (HCC) 10/06/2022   Fall 10/06/2022   Abnormal EKG 10/06/2022   Normocytic anemia 10/06/2022   Bone lesion_sclerotic lesions in the right iliac bone 10/06/2022   Elevated blood pressure reading 10/06/2022    REFERRING DIAG: B14.782 (ICD-10-CM) - Presence of left artificial hip joint   THERAPY DIAG:  History of left hip hemiarthroplasty  Weakness of left leg  Balance problems  Difficulty in walking, not elsewhere classified  Rationale for Evaluation and Treatment Rehabilitation  PERTINENT HISTORY: From initial eval: Christian Hughes is a 62 y.o. male without significant past medical history except for occasional Hughes amd marijuana use, who presents with fall and left hip pain.  Pt states that he slipped and fell  at work at about 11:00 AM.  Denies loss of consciousness.  He injured his left hip, causing left hip pain, which is constant, sharp, severe, nonradiating, aggravated by movement.  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    "My mom took care of me after my hip surgery. I had therapy at home. I returned to my home alone after my first follow up appointment. I have pain in my buttocks, groin and the corner of my buttocks of about 2 to 3/10. I have high pain tolerance. I am unable to walk normal and I feel weak. I began to use this cane on my own after my PT at home stopped.  I can't wait to go to work because, I love to work."   PAIN:  Are you having pain? Yes: Pain location: 2-3/10 Pain description: stabbing Aggravating factors: Moving, walking, WB   Relieving factors: rest, OTC meds, ice    WEIGHT BEARING RESTRICTIONS: Yes:  WBAT   FALLS:  Has patient fallen in last 6 months? Yes. 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 Pt negotiates some small staircases in town. Pt has historically taken 24-packs of water bottles to his work for customers.    PLOF: Independent   PATIENT GOALS: "I want to return to work without hurting and falling as soon as I can."   NEXT MD VISIT: 11/22/2022    PRECAUTIONS: Posterior hip precautions   L hip hemiarthroplasty following L femoral neck fracture (DOS: 10/07/22)    SUBJECTIVE:                                                                                                                                                                                       SUBJECTIVE STATEMENT:  Patient reports some lateral hip discomfort. Patient reports trying to walk without his cane and he is hoping to gradually wean down from it. He arrives without [facility] today.    PAIN:  Are you having pain? 1.5-2/10 pain L groin   Pain location: L groin, L posterolateral hip   OBJECTIVE: (objective measures completed at initial evaluation unless otherwise dated)   PATIENT SURVEYS:  LEFS 18/80 FOTO 36   COGNITION: Overall cognitive status: Within functional limits for tasks assessed and impulsive.                            SENSATION: WFL   MUSCLE LENGTH: deferred  test 2/2 to pain.    POSTURE: rounded shoulders   PALPATION: Pain in L sacral border, Iliac crest, gluteus medius, TFL, and greater trochanter     LOWER EXTREMITY ROM:    Active ROM Right eval Left eval  Hip flexion WFL 20 deg with pain  Hip extension [facility]    Hip abduction WFL 20 with pain   Hip adduction [facility]    Hip internal rotation [facility]    Hip external rotation [facility]    Knee flexion [facility]  Knee extension [facility]  Ankle dorsiflexion [facility]  Ankle plantarflexion [facility]  Ankle inversion [facility]  Ankle eversion WFL WFL   (Blank rows = not tested)   LOWER EXTREMITY MMT:   MMT Right eval Left eval Left 12/04/22  Hip flexion WFL 2+ 4*  Hip extension [facility] 2 3+  Hip abduction [facility] 2 4-  Hip adduction [facility]     Hip internal rotation [facility]     Hip external rotation [facility]     Knee flexion WFL 3- 3+  Knee extension WFl 3 4+  Ankle dorsiflexion WFL 3+  Ankle plantarflexion WFL 4   Ankle inversion WFL 4   Ankle eversion WFL 3-    (Blank rows = not tested) Pt's L ankle remains inverted 10 degrees since the Surgery.     FUNCTIONAL TESTS:  30 seconds chair stand test: 5 reps completed Timed up and go (TUG): 19secs 10 meter walk test: 12secs= 1,2 m/secs             Balance Test : SL standing unable, Modified tandem 10  secs, Tandem Unable, Rhomberg with EO 5 secs and unable with EC.  GAIT: Distance walked: 30 ft  Assistive device utilized: Single point cane Level of assistance: SBA Comments: Pt unsteady on feet with severe antalgic gait, decreased loading response, absent heel strike and absent toe off gait with uneven stride and decreased speed. Heavy reliance on the [facility]       TODAY'S TREATMENT:                                                                                                                              DATE: 12/11/2022     Pt presents with moderate compensated Trendelenburg with lateral flexion toward L with L stance phase, mild antalgic pattern    Therapeutic Exercise - for improved soft tissue flexibility and extensibility as needed for ROM, improved strength as needed to improve performance of CKC activities/functional movements  Sidelying hip abduction; 2x6  Bridge; 2x12;  Black Tband   -verbal and tactile cueing for technique  3-way hip; x 8 ea dir; bilat LE; single hand on treadmill armrest for balance  Sit to stand; 2x10 from treatment table with Goblet hold, 6-lb Medball hold   // bars side stepping; x 5 D/B each for length of // bars  Toe tapping; 2x10 alternating RL with 6-inch step  -BUE support with progression to unilateral R UE support   Patient education: Reviewed HEP handout from [facility] and discussed baseline exercises that can be omitted at this time due to good progress.    *next visit* Partial squat, 0-60 deg knee flexion; 2x10     *not today* Seated knee extension, 7.5-lb ankle weights; 2x10 alternating R/L SLR attempted   -groin pain and difficulty initiating lift  Quad set; reviewed for HEP Glute set; reviewed for HEP    Manual Therapy - for symptom modulation, soft tissue sensitivity and mobility, joint mobility, ROM    *not today* STM and IASTM with Hypervolt along L gluteus medius/maximus; x 5 minutes   -limited tolerance with direct  oscillation of Hypervolt adjacent to greater trochanter In supine: Gentle L hip log rolling; 2 bouts of 30 seconds  Gentle L hip PROM within limits of posterior hip precautions; x 2 minutes  -Pt tolerates hip ER WNL, hip flexion to 80 deg, hip ABD to 20 deg STM and IASTM with Theraband roller along L hip flexors, L proximal quads x 5 minutes Cold pack (unbilled) - for anti-inflammatory and analgesic effect as  needed for reduced pain and improved ability to participate in active PT intervention, along L hip with pt in R sidelying, x 5 minutes     PATIENT EDUCATION:  Education details: Pt education about findings, rehab process, significance of fall prevention and use of FWW  and anatomy.  Person educated: Patient Education method: Medical illustrator Education comprehension: verbalized understanding   HOME EXERCISE PROGRAM:      ASSESSMENT:   CLINICAL IMPRESSION: Patient has made good progress to date and is able to ambulate at community level with [facility] with minimal gait deviation. He does still have apparent gait deviations (noted above) with ambulation with no AD. Pt can safely negotiate home-level distance without AD, though moderate deviation persists. He participates very well with PT and is notably challenged with new weightbearing drills added. Patient has remaining deficits in hip ROM, LLE strength, LLE weightbearing, gait changes/antalgic pattern, and pain (intermittently affecting L groin and/or L posterolateral hip/gluteus medius region). Patient will benefit from continued skilled therapeutic intervention to address the above deficits as needed for improved function and QoL.      OBJECTIVE IMPAIRMENTS: Abnormal gait, decreased activity tolerance, decreased balance, decreased endurance, decreased knowledge of condition, decreased knowledge of use of DME, decreased mobility, difficulty walking, decreased ROM, decreased strength, and dizziness.    ACTIVITY LIMITATIONS:  carrying, lifting, bending, sitting, standing, squatting, sleeping, stairs, transfers, dressing, and locomotion level   PARTICIPATION LIMITATIONS: cleaning, shopping, community activity, occupation, and yard work   PERSONAL FACTORS:  Pt is impulsive and eager to get better so that he can return to work.   are also affecting patient's functional outcome.    REHAB POTENTIAL: Good   CLINICAL DECISION MAKING: Stable/uncomplicated   EVALUATION COMPLEXITY: Low     GOALS: Goals reviewed with patient? Yes   SHORT TERM GOALS: Target date: 12/22/2022 Pt will improve L hip ROM to 10 to 90 degrees without pain in order to promote normal gait and stability.  Baseline: 20 degree Hip felx and 0 deg Ext 12/04/22: 90 deg hip flex and 5 deg EXT Goal status: PARTIALLY MET    2.  Pt will be able to ambulate 529ft safely with Cane without fall and pain . Baseline: 30 ft with SBA and heavy reliance on [facility].   12/04/22: Pt uses SPC for primary means of mobility in home and community at this time.  Goal status: ACHIEVED       LONG TERM GOALS: Target date: 01/26/2023   Pt will Improve TUG time to <10secs without SPC to demonstrate decreased fall risk.  Baseline: 19 secs with SPC 12/04/22: 14.6 seconds with SPC Goal status: IN PROGRESS   2.  Pt will Improve LEFS score to >65/80 to demonstrate improved QoL Baseline: 18/80 12/04/22: 20/80  Goal status: NOT MET    3.  Pt will be able to ambulate Independently  with LRAD without LOB and pain in order to return to community level mobility safely  Baseline: With [facility] and FWW.   12/04/22: ModI ambulation with SPC/LRAD with mild pain at longer distances, no LOB Goal status: IN PROGRESS    4.  Pt will score >50 on FOTO Baseline: 36/56 12/04/22: 35/56 Goal status: NOT MET    5.  Pt will demonstrate 4/5 L hip strength without pain to improve stability and safety with all functional mobility in order to return to work.  Baseline: 2/5 grossly    12/04/22: 4-/5  grossly Goal status: IN PROGRESS  PLAN:   PT FREQUENCY: 2x/week   PT DURATION: 8 weeks   PLANNED INTERVENTIONS: Therapeutic exercises, Therapeutic activity, Neuromuscular re-education, Balance training, Gait training, Patient/Family education, Self Care, and Joint mobilization   PLAN FOR NEXT SESSION: Progressive gait and stepping activity with gradual AD wean, weight shifting work to improve standing tolerance on surgical LE, progressive LE strengthening. Manual therapy as needed for anterior hip and/or posterolateral hip pain.    Consuela Mimes, PT, DPT #[id number] Gertie Exon, PT 12/11/2022, 3:54 PM

## 2022-12-13 ENCOUNTER — Encounter: Payer: Self-pay | Admitting: Physical Therapy

## 2022-12-14 ENCOUNTER — Ambulatory Visit: Payer: Worker's Compensation | Admitting: Physical Therapy

## 2022-12-14 ENCOUNTER — Encounter: Payer: Self-pay | Admitting: Physical Therapy

## 2022-12-14 DIAGNOSIS — Z96642 Presence of left artificial hip joint: Secondary | ICD-10-CM | POA: Diagnosis not present

## 2022-12-14 DIAGNOSIS — R262 Difficulty in walking, not elsewhere classified: Secondary | ICD-10-CM

## 2022-12-14 DIAGNOSIS — R2689 Other abnormalities of gait and mobility: Secondary | ICD-10-CM

## 2022-12-14 DIAGNOSIS — R29898 Other symptoms and signs involving the musculoskeletal system: Secondary | ICD-10-CM

## 2022-12-14 NOTE — Therapy (Signed)
OUTPATIENT PHYSICAL THERAPY TREATMENT NOTE   Patient Name: Christian Hughes MRN: [id number] DOB:02-08-1961, 62 y.o., male Today's Date: 12/14/2022  PCP: Dewaine Oats MD  REFERRING PROVIDER: Juanell Fairly MD   END OF SESSION:   PT End of Session - 12/14/22 1346     Visit Number 9    Number of Visits 20    Date for PT Re-Evaluation 12/22/22    PT Start Time 1346    PT Stop Time 1428    PT Time Calculation (min) 42 min    Activity Tolerance Patient tolerated treatment well;Patient limited by pain    Behavior During Therapy [facility] for tasks assessed/performed;Impulsive                 Past Medical History:  Diagnosis Date   Engages in vaping    H/O ventral hernia    S/p of repair   Marijuana use    Orthostatic dizziness 10/08/2022   Past Surgical History:  Procedure Laterality Date   HIP ARTHROPLASTY Left 10/07/2022   Procedure: ARTHROPLASTY BIPOLAR HIP (HEMIARTHROPLASTY);  Surgeon: Juanell Fairly, MD;  Location: [facility] ORS;  Service: Orthopedics;  Laterality: Left;   Ventral hernia repair     Patient Active Problem List   Diagnosis Date Noted   Acute urinary retention 10/09/2022   Leukocytosis 10/08/2022   Closed left hip fracture, initial encounter (HCC) 10/06/2022   Fall 10/06/2022   Abnormal EKG 10/06/2022   Normocytic anemia 10/06/2022   Bone lesion_sclerotic lesions in the right iliac bone 10/06/2022   Elevated blood pressure reading 10/06/2022    REFERRING DIAG: W09.811 (ICD-10-CM) - Presence of left artificial hip joint   THERAPY DIAG:  History of left hip hemiarthroplasty  Weakness of left leg  Balance problems  Difficulty in walking, not elsewhere classified  Rationale for Evaluation and Treatment Rehabilitation  PERTINENT HISTORY: From initial eval: Christian Hughes is a 62 y.o. male without significant past medical history except for occasional vaping amd marijuana use, who presents with fall and left hip pain.  Pt states that he slipped and fell  at work at about 11:00 AM.  Denies loss of consciousness.  He injured his left hip, causing left hip pain, which is constant, sharp, severe, nonradiating, aggravated by movement.  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    "My mom took care of me after my hip surgery. I had therapy at home. I returned to my home alone after my first follow up appointment. I have pain in my buttocks, groin and the corner of my buttocks of about 2 to 3/10. I have high pain tolerance. I am unable to walk normal and I feel weak. I began to use this cane on my own after my PT at home stopped.  I can't wait to go to work because, I love to work."   PAIN:  Are you having pain? Yes: Pain location: 2-3/10 Pain description: stabbing Aggravating factors: Moving, walking, WB   Relieving factors: rest, OTC meds, ice    WEIGHT BEARING RESTRICTIONS: Yes:  WBAT   FALLS:  Has patient fallen in last 6 months? Yes. Number of falls once once 3 months ago   LIVING ENVIRONMENT: Lives with: lives alone Lives in: Mobile home Stairs: Yes: External: 4 steps; can reach both Has following equipment at home: Single point cane   OCCUPATION: Works in the office for [employer]. 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 Pt negotiates some small staircases in town. Pt has historically taken 24-packs of water bottles to his work for customers.    PLOF: Independent   PATIENT GOALS: "I want to return to work without hurting and falling as soon as I can."   NEXT MD VISIT: 11/22/2022    PRECAUTIONS: Posterior hip precautions   L hip hemiarthroplasty following L femoral neck fracture (DOS: 10/07/22)    SUBJECTIVE:                                                                                                                                                                                       SUBJECTIVE STATEMENT:  Patient reports 1/10 pain affecting lateral hip at arrival. Patient reports that pain depends on how much he is getting up and down. Patient reports notable soreness after last visit. Pt reports generally feeling well this week.    PAIN:  Are you having pain? 1/10 pain L groin   Pain location: L groin, L posterolateral hip   OBJECTIVE: (objective measures completed at initial evaluation unless otherwise dated)   PATIENT SURVEYS:  LEFS 18/80 FOTO 36   COGNITION: Overall cognitive status: Within functional limits for tasks assessed and impulsive.                            SENSATION: WFL   MUSCLE LENGTH: deferred  test 2/2 to pain.    POSTURE: rounded shoulders   PALPATION: Pain in L sacral border, Iliac crest, gluteus medius, TFL, and greater trochanter     LOWER EXTREMITY ROM:    Active ROM Right eval Left eval  Hip flexion WFL 20 deg with pain  Hip extension [facility]    Hip abduction WFL 20 with pain   Hip adduction [facility]    Hip internal rotation [facility]    Hip external rotation [facility]    Knee flexion [facility]  Knee extension [facility]  Ankle dorsiflexion [facility]  Ankle plantarflexion [facility]  Ankle inversion [facility]  Ankle eversion WFL WFL   (Blank rows = not tested)   LOWER EXTREMITY MMT:   MMT Right eval Left eval Left 12/04/22  Hip flexion WFL 2+ 4*  Hip extension [facility] 2 3+  Hip abduction WFl 2 4-  Hip adduction WFl     Hip internal rotation [facility]     Hip external rotation [facility]     Knee flexion WFL 3- 3+  Knee extension  WFl 3 4+  Ankle dorsiflexion WFL 3+   Ankle plantarflexion WFL 4   Ankle inversion WFL 4   Ankle eversion WFL 3-    (Blank rows = not tested) Pt's L ankle remains inverted 10 degrees since the Surgery.     FUNCTIONAL TESTS:  30 seconds chair stand test: 5 reps completed Timed up and go (TUG): 19secs 10 meter walk test: 12secs= 1,2 m/secs              Balance Test : SL standing unable, Modified tandem 10 secs, Tandem Unable, Rhomberg with EO 5 secs and unable with EC.  GAIT: Distance walked: 30 ft  Assistive device utilized: Single point cane Level of assistance: SBA Comments: Pt unsteady on feet with severe antalgic gait, decreased loading response, absent heel strike and absent toe off gait with uneven stride and decreased speed. Heavy reliance on the [facility]       TODAY'S TREATMENT:                                                                                                                              DATE: 12/14/2022     Pt presents with mild  compensated Trendelenburg with lateral flexion toward L with L stance phase  -deviation is minimized with use of SPC    Therapeutic Exercise - for improved soft tissue flexibility and extensibility as needed for ROM, improved strength as needed to improve performance of CKC activities/functional movements   Sidelying hip abduction; 2x8  Bridge; 2x12;  Black Tband   -verbal and tactile cueing for technique  Sit to stand; 2x10 from treatment table with Goblet hold, 6-lb Medball hold   -second set performed in increments of 5   3-way hip; x 8 ea dir; bilat LE; single hand on treadmill armrest for balance  // bars side stepping; x 5 D/B each for length of // bars  Toe tapping; 2x10 alternating RL with 6-inch step  -BUE support with progression to unilateral R UE support  -Mirror feedback to reduce compensated Trenelenburg  Partial squat, 0-60 deg knee flexion; 2x10  Semitandem stance; x 30 sec without foam, x 30 sec with foam    *not today* Seated knee extension, 7.5-lb ankle weights; 2x10 alternating R/L SLR attempted   -groin pain and difficulty initiating lift  Quad set; reviewed for HEP Glute set; reviewed for HEP    Manual Therapy - for symptom modulation, soft tissue sensitivity and mobility, joint mobility, ROM    *not today* STM and IASTM with Hypervolt along L  gluteus medius/maximus; x 5 minutes   -limited tolerance with direct oscillation of Hypervolt adjacent to greater trochanter In supine: Gentle L hip log rolling; 2 bouts of 30 seconds  Gentle L hip PROM within limits of posterior hip precautions; x 2 minutes  -Pt tolerates hip ER WNL, hip flexion to 80 deg, hip ABD to 20 deg STM and IASTM with Theraband roller along L hip  flexors, L proximal quads x 5 minutes Cold pack (unbilled) - for anti-inflammatory and analgesic effect as needed for reduced pain and improved ability to participate in active PT intervention, along L hip with pt in R sidelying, x 5 minutes     PATIENT EDUCATION:  Education details: Pt education about findings, rehab process, significance of fall prevention and use of FWW  and anatomy.  Person educated: Patient Education method: Medical illustrator Education comprehension: verbalized understanding   HOME EXERCISE PROGRAM: Access Code: [email or web address] URL: [email or web address] Date: 12/14/2022 Prepared by: Consuela Mimes  Exercises - Sit to Stand  - 1 x daily - 7 x weekly - 2 sets - 10 reps - Supine Bridge with Resistance Band  - 1 x daily - 7 x weekly - 2 sets - 10 reps - Sidelying Hip Abduction  - 1 x daily - 7 x weekly - 2 sets - 10 reps - Standing 3-Way Kick  - 1 x daily - 7 x weekly - 2 sets - 10 reps     ASSESSMENT:   CLINICAL IMPRESSION: Patient has remaining gait deviations, though they are markedly improved over last week. He is able to complete all community-level mobility with SPC at this time. Pt is still notably challenged with squatting variations and activities requiring brief unipedal stance. We will continue to gradually progress closed-chain work, weight shifting, and postural control as tolerated with successive PT visits. Patient has remaining deficits in hip ROM, LLE strength, LLE weightbearing, gait changes/antalgic pattern, and pain (intermittently affecting L groin and/or  L posterolateral hip/gluteus medius region). Patient will benefit from continued skilled therapeutic intervention to address the above deficits as needed for improved function and QoL.      OBJECTIVE IMPAIRMENTS: Abnormal gait, decreased activity tolerance, decreased balance, decreased endurance, decreased knowledge of condition, decreased knowledge of use of DME, decreased mobility, difficulty walking, decreased ROM, decreased strength, and dizziness.    ACTIVITY LIMITATIONS: carrying, lifting, bending, sitting, standing, squatting, sleeping, stairs, transfers, dressing, and locomotion level   PARTICIPATION LIMITATIONS: cleaning, shopping, community activity, occupation, and yard work   PERSONAL FACTORS:  Pt is impulsive and eager to get better so that he can return to work.   are also affecting patient's functional outcome.    REHAB POTENTIAL: Good   CLINICAL DECISION MAKING: Stable/uncomplicated   EVALUATION COMPLEXITY: Low     GOALS: Goals reviewed with patient? Yes   SHORT TERM GOALS: Target date: 12/22/2022 Pt will improve L hip ROM to 10 to 90 degrees without pain in order to promote normal gait and stability.  Baseline: 20 degree Hip felx and 0 deg Ext 12/04/22: 90 deg hip flex and 5 deg EXT Goal status: PARTIALLY MET    2.  Pt will be able to ambulate 523ft safely with Cane without fall and pain . Baseline: 30 ft with SBA and heavy reliance on [facility].   12/04/22: Pt uses SPC for primary means of mobility in home and community at this time.  Goal status: ACHIEVED       LONG TERM GOALS: Target date: 01/26/2023   Pt will Improve TUG time to <10secs without SPC to demonstrate decreased fall risk.  Baseline: 19 secs with SPC 12/04/22: 14.6 seconds with SPC Goal status: IN PROGRESS   2.  Pt will Improve LEFS score to >65/80 to demonstrate improved QoL Baseline: 18/80 12/04/22: 20/80  Goal status: NOT MET    3.  Pt will be able to ambulate Independently  with LRAD without LOB  and  pain in order to return to community level mobility safely  Baseline: With [facility] and FWW.   12/04/22: ModI ambulation with SPC/LRAD with mild pain at longer distances, no LOB Goal status: IN PROGRESS    4.  Pt will score >50 on FOTO Baseline: 36/56 12/04/22: 35/56 Goal status: NOT MET    5.  Pt will demonstrate 4/5 L hip strength without pain to improve stability and safety with all functional mobility in order to return to work.  Baseline: 2/5 grossly    12/04/22: 4-/5 grossly Goal status: IN PROGRESS          PLAN:   PT FREQUENCY: 2x/week   PT DURATION: 8 weeks   PLANNED INTERVENTIONS: Therapeutic exercises, Therapeutic activity, Neuromuscular re-education, Balance training, Gait training, Patient/Family education, Self Care, and Joint mobilization   PLAN FOR NEXT SESSION: Progressive gait and stepping activity with gradual AD wean, weight shifting work to improve standing tolerance on surgical LE, progressive LE strengthening. Manual therapy as needed for anterior hip and/or posterolateral hip pain.    Consuela Mimes, PT, DPT #[id number] Gertie Exon, PT 12/14/2022, 3:45 PM

## 2022-12-18 ENCOUNTER — Encounter: Payer: Self-pay | Admitting: Physical Therapy

## 2022-12-18 ENCOUNTER — Ambulatory Visit: Payer: Worker's Compensation | Admitting: Physical Therapy

## 2022-12-18 DIAGNOSIS — R262 Difficulty in walking, not elsewhere classified: Secondary | ICD-10-CM

## 2022-12-18 DIAGNOSIS — Z96642 Presence of left artificial hip joint: Secondary | ICD-10-CM

## 2022-12-18 DIAGNOSIS — R2689 Other abnormalities of gait and mobility: Secondary | ICD-10-CM

## 2022-12-18 DIAGNOSIS — R29898 Other symptoms and signs involving the musculoskeletal system: Secondary | ICD-10-CM

## 2022-12-18 NOTE — Therapy (Unsigned)
OUTPATIENT PHYSICAL THERAPY TREATMENT AND PROGRESS NOTE   Dates of reporting period  11/15/22   to   12/18/22   Patient Name: Christian Hughes MRN: [id number] DOB:01/14/1961, 62 y.o., male Today's Date: 12/18/2022  PCP: Dewaine Oats MD  REFERRING PROVIDER: Juanell Fairly MD   END OF SESSION:   PT End of Session - 12/18/22 1550     Visit Number 10    Number of Visits 20    Date for PT Re-Evaluation 12/22/22    PT Start Time 1548    PT Stop Time 1628    PT Time Calculation (min) 40 min    Activity Tolerance Patient tolerated treatment well;Patient limited by pain    Behavior During Therapy [facility] for tasks assessed/performed;Impulsive             Past Medical History:  Diagnosis Date   Engages in vaping    H/O ventral hernia    S/p of repair   Marijuana use    Orthostatic dizziness 10/08/2022   Past Surgical History:  Procedure Laterality Date   HIP ARTHROPLASTY Left 10/07/2022   Procedure: ARTHROPLASTY BIPOLAR HIP (HEMIARTHROPLASTY);  Surgeon: Juanell Fairly, MD;  Location: [facility] ORS;  Service: Orthopedics;  Laterality: Left;   Ventral hernia repair     Patient Active Problem List   Diagnosis Date Noted   Acute urinary retention 10/09/2022   Leukocytosis 10/08/2022   Closed left hip fracture, initial encounter (HCC) 10/06/2022   Fall 10/06/2022   Abnormal EKG 10/06/2022   Normocytic anemia 10/06/2022   Bone lesion_sclerotic lesions in the right iliac bone 10/06/2022   Elevated blood pressure reading 10/06/2022    REFERRING DIAG: W09.811 (ICD-10-CM) - Presence of left artificial hip joint   THERAPY DIAG:  History of left hip hemiarthroplasty  Weakness of left leg  Balance problems  Difficulty in walking, not elsewhere classified  Rationale for Evaluation and Treatment Rehabilitation  PERTINENT HISTORY: From initial eval: Christian Hughes is a 62 y.o. male without significant past medical history except for occasional vaping amd marijuana use, who presents with  fall and left hip pain.  Pt states that he slipped and fell at work at about 11:00 AM.  Denies loss of consciousness.  He injured his left hip, causing left hip pain, which is constant, sharp, severe, nonradiating, aggravated by movement.  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    "My mom took care of me after my hip surgery. I had therapy at home. I returned to my home alone after my first follow up appointment. I have pain in my buttocks, groin and the corner of my buttocks of about 2 to 3/10. I have high pain tolerance. I am unable to walk normal and I feel weak. I began to use this cane on my own after my PT at home stopped.  I can't wait to go to work because, I love to work."   PAIN:  Are you having pain? Yes: Pain location: 2-3/10 Pain description: stabbing Aggravating factors: Moving, walking, WB   Relieving factors: rest, OTC meds, ice    WEIGHT BEARING RESTRICTIONS: Yes:  WBAT   FALLS:  Has patient fallen in last 6 months? Yes. 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 Pt negotiates some small staircases in town. Pt has historically taken 24-packs of water bottles to his work for customers.    PLOF: Independent   PATIENT GOALS: "I want to return to work without hurting and falling as soon as I can."   NEXT MD VISIT: 11/22/2022    PRECAUTIONS: Posterior hip precautions   L hip hemiarthroplasty following L femoral neck fracture (DOS: 10/07/22)    SUBJECTIVE:                                                                                                                                                                                       SUBJECTIVE STATEMENT:  Patient reports notable pain over last couple of days affecting L groin. He is concerned with his L foot resting in internally rotated position. He reports making good progress in regard to weaning from his walker and he is motivated to return to work and his PLOF.    PAIN:  Are you having pain? 1.5-2/10 pain L groin   Pain location: L groin, L posterolateral hip   OBJECTIVE: (objective measures completed at initial evaluation unless otherwise dated)   PATIENT SURVEYS:  LEFS 18/80 FOTO 36   COGNITION: Overall cognitive status: Within functional limits for tasks assessed and impulsive.                            SENSATION: WFL   MUSCLE LENGTH: deferred  test 2/2 to pain.    POSTURE: rounded shoulders   PALPATION: Pain in L sacral border, Iliac crest, gluteus medius, TFL, and greater trochanter     LOWER EXTREMITY ROM:    Active ROM Right eval Left eval  Hip flexion WFL 20 deg with pain  Hip extension [facility]    Hip abduction WFL 20 with pain   Hip adduction [facility]    Hip internal rotation [facility]    Hip external rotation [facility]    Knee flexion [facility]  Knee extension [facility]  Ankle dorsiflexion [facility]  Ankle plantarflexion [facility]  Ankle inversion [facility]  Ankle eversion WFL WFL   (Blank rows = not tested)   LOWER EXTREMITY MMT:   MMT Right eval Left eval Left 12/04/22 Left 12/18/22  Hip flexion WFL 2+ 4* 4-  Hip extension [facility] 2 3+   Hip abduction [facility] 2 4-   Hip adduction [facility]  Hip internal rotation [facility]      Hip external rotation [facility]      Knee flexion WFL 3- 3+ 4-  Knee extension WFl 3 4+ 4*  Ankle dorsiflexion WFL 3+    Ankle plantarflexion WFL 4    Ankle inversion WFL 4    Ankle eversion WFL 3-     (Blank rows = not tested) Pt's L ankle remains inverted 10 degrees since the Surgery.     FUNCTIONAL TESTS  (INITIAL EVALUATION) 30 seconds chair stand test: 5 reps completed Timed up and go (TUG): 19secs 10 meter walk test: 12secs= 1,2 m/secs             Balance Test : SL standing unable, Modified tandem 10 secs, Tandem Unable, Rhomberg with EO 5 secs and unable with EC.  GAIT: Distance walked: 30 ft  Assistive device utilized: Single point cane Level of assistance: SBA Comments: Pt unsteady on feet with severe antalgic gait, decreased loading response, absent heel strike and absent toe off gait with uneven stride and decreased speed. Heavy reliance on the [facility]       TODAY'S TREATMENT:                                                                                                                              DATE: 12/18/2022     Therapeutic Exercise - for improved soft tissue flexibility and extensibility as needed for ROM, improved strength as needed to improve performance of CKC activities/functional movements  *GOAL UPDATE PERFORMED   *next visit* Sidelying hip abduction; 2x8 Bridge; 2x12;  Black Tband   -verbal and tactile cueing for technique Sit to stand; 2x10 from treatment table with Goblet hold, 6-lb Medball hold   -second set performed in increments of 5  Toe tapping; 2x10 alternating RL with 6-inch step  -BUE support with progression to unilateral R UE support  -Mirror feedback to reduce compensated Trenelenburg Partial squat, 0-60 deg knee flexion; 2x10 Semitandem stance; x 30 sec without foam, x 30 sec with foam     *not today* // bars side stepping; x 5 D/B each for length of // bars 3-way hip; x 8 ea dir; bilat LE; single hand on treadmill armrest for balance Seated knee extension, 7.5-lb ankle weights; 2x10 alternating R/L SLR attempted   -groin pain and difficulty initiating lift      Manual Therapy - for symptom modulation, soft tissue sensitivity and mobility, joint mobility, ROM   In supine: STM and IASTM with Theraband roller along L hip flexors, L  proximal quads x 5 minutes Gentle L hip PROM within limits of posterior hip precautions; x 2 minutes  -Pt tolerates hip ER WNL, hip flexion to 80 deg, hip ABD to 20 deg  *not today* STM and IASTM with Hypervolt along L gluteus medius/maximus; x 5 minutes   -limited tolerance with direct oscillation of Hypervolt adjacent to greater trochanter    Cold pack (unbilled) -  for anti-inflammatory and analgesic effect as needed for reduced pain and improved ability to participate in active PT intervention, along L hip with pt in R sidelying, x 5 minutes     PATIENT EDUCATION:  Education details: Pt education about findings, rehab process, significance of fall prevention and use of FWW  and anatomy.  Person educated: Patient Education method: Medical illustrator Education comprehension: verbalized understanding   HOME EXERCISE PROGRAM: Access Code: [email or web address] URL: [email or web address] Date: 12/14/2022 Prepared by: Consuela Mimes  Exercises - Sit to Stand  - 1 x daily - 7 x weekly - 2 sets - 10 reps - Supine Bridge with Resistance Band  - 1 x daily - 7 x weekly - 2 sets - 10 reps - Sidelying Hip Abduction  - 1 x daily - 7 x weekly - 2 sets - 10 reps - Standing 3-Way Kick  - 1 x daily - 7 x weekly - 2 sets - 10 reps     ASSESSMENT:   CLINICAL IMPRESSION: Patient has remaining gait deviations, though they are markedly improved over last week. He is able to complete all community-level mobility with SPC at this time. Pt is still notably challenged with squatting variations and activities requiring brief unipedal stance. We will continue to gradually progress closed-chain work, weight shifting, and postural control as tolerated with successive PT visits. Patient has remaining deficits in hip ROM, LLE strength, LLE weightbearing, gait changes/antalgic pattern, and pain (intermittently affecting L groin and/or L posterolateral hip/gluteus medius region). Patient will  benefit from continued skilled therapeutic intervention to address the above deficits as needed for improved function and QoL.      OBJECTIVE IMPAIRMENTS: Abnormal gait, decreased activity tolerance, decreased balance, decreased endurance, decreased knowledge of condition, decreased knowledge of use of DME, decreased mobility, difficulty walking, decreased ROM, decreased strength, and dizziness.    ACTIVITY LIMITATIONS: carrying, lifting, bending, sitting, standing, squatting, sleeping, stairs, transfers, dressing, and locomotion level   PARTICIPATION LIMITATIONS: cleaning, shopping, community activity, occupation, and yard work   PERSONAL FACTORS:  Pt is impulsive and eager to get better so that he can return to work.   are also affecting patient's functional outcome.    REHAB POTENTIAL: Good   CLINICAL DECISION MAKING: Stable/uncomplicated   EVALUATION COMPLEXITY: Low     GOALS: Goals reviewed with patient? Yes   SHORT TERM GOALS: Target date: 12/22/2022 Pt will improve L hip ROM to 10 to 90 degrees without pain in order to promote normal gait and stability.  Baseline: 20 degree Hip felx and 0 deg Ext 12/04/22: 90 deg hip flex and 5 deg EXT 12/18/22: 90 deg hip flex and 10 deg hip EXT Goal status: ACHIEVED    2.  Pt will be able to ambulate 576ft safely with Cane without fall and pain . Baseline: 30 ft with SBA and heavy reliance on [facility].   12/04/22: Pt uses SPC for primary means of mobility in home and community at this time.  Goal status: ACHIEVED       LONG TERM GOALS: Target date: 01/26/2023   Pt will Improve TUG time to <10secs without SPC to demonstrate decreased fall risk.  Baseline: 19 secs with SPC 12/04/22: 14.6 seconds with SPC 12/18/22: 9.5 sec Goal status: ACHIEVED   2.  Pt will Improve LEFS score to >65/80 to demonstrate improved QoL Baseline: 18/80 12/04/22: 20/80  12/18/22: 22/80 Goal status: NOT MET    3.  Pt will be able to ambulate Independently  with LRAD  without LOB and pain in order to return to community level mobility safely  Baseline: With [facility] and FWW.   12/04/22: ModI ambulation with SPC/LRAD with mild pain at longer distances, no LOB 12/18/22: Amb IND with SPC with no LOB Goal status: ACHIEVED    4.  Pt will score >50 on FOTO Baseline: 36/56 12/04/22: 35/56 12/18/22: 37/56 Goal status: NOT MET    5.  Pt will demonstrate 4/5 L hip strength without pain to improve stability and safety with all functional mobility in order to return to work.  Baseline: 2/5 grossly    12/04/22: 4-/5 grossly 12/18/22: 4-/5 grossly Goal status: IN PROGRESS          PLAN:   PT FREQUENCY: 2x/week   PT DURATION: 4 weeks   PLANNED INTERVENTIONS: Therapeutic exercises, Therapeutic activity, Neuromuscular re-education, Balance training, Gait training, Patient/Family education, Self Care, and Joint mobilization   PLAN FOR NEXT SESSION: Progressive gait and stepping activity with gradual AD wean, weight shifting work to improve standing tolerance on surgical LE, progressive LE strengthening. Manual therapy as needed for anterior hip and/or posterolateral hip pain.    Consuela Mimes, PT, DPT #[id number] Gertie Exon, PT 12/18/2022, 3:50 PM

## 2022-12-20 ENCOUNTER — Encounter: Payer: Self-pay | Admitting: Physical Therapy

## 2022-12-21 ENCOUNTER — Ambulatory Visit: Payer: Worker's Compensation | Admitting: Physical Therapy

## 2022-12-21 DIAGNOSIS — R29898 Other symptoms and signs involving the musculoskeletal system: Secondary | ICD-10-CM

## 2022-12-21 DIAGNOSIS — Z96642 Presence of left artificial hip joint: Secondary | ICD-10-CM

## 2022-12-21 DIAGNOSIS — R2689 Other abnormalities of gait and mobility: Secondary | ICD-10-CM

## 2022-12-21 DIAGNOSIS — R262 Difficulty in walking, not elsewhere classified: Secondary | ICD-10-CM

## 2022-12-21 NOTE — Therapy (Signed)
OUTPATIENT PHYSICAL THERAPY TREATMENT    Patient Name: Christian Hughes MRN: [id number] DOB:[birth year], 61 y.o., male Today's Date: 12/21/2022  END OF SESSION:   PT End of Session - 12/21/22 1522     Visit Number 11    Number of Visits 20    Date for PT Re-Evaluation 12/22/22    PT Start Time 1520    PT Stop Time 1600    PT Time Calculation (min) 40 min    Activity Tolerance Patient tolerated treatment well;Patient limited by pain    Behavior During Therapy [facility] for tasks assessed/performed;Impulsive              Past Medical History:  Diagnosis Date   Engages in vaping    H/O ventral hernia    S/p of repair   Marijuana use    Orthostatic dizziness 10/08/2022   Past Surgical History:  Procedure Laterality Date   HIP ARTHROPLASTY Left 10/07/2022   Procedure: ARTHROPLASTY BIPOLAR HIP (HEMIARTHROPLASTY);  Surgeon: Juanell Fairly, MD;  Location: [facility] ORS;  Service: Orthopedics;  Laterality: Left;   Ventral hernia repair     Patient Active Problem List   Diagnosis Date Noted   Acute urinary retention 10/09/2022   Leukocytosis 10/08/2022   Closed left hip fracture, initial encounter (HCC) 10/06/2022   Fall 10/06/2022   Abnormal EKG 10/06/2022   Normocytic anemia 10/06/2022   Bone lesion_sclerotic lesions in the right iliac bone 10/06/2022   Elevated blood pressure reading 10/06/2022    PCP: Dewaine Oats MD  REFERRING PROVIDER: Juanell Fairly MD   REFERRING DIAG: [phone] (ICD-10-CM) - Presence of left artificial hip joint   THERAPY DIAG:  History of left hip hemiarthroplasty  Weakness of left leg  Balance problems  Difficulty in walking, not elsewhere classified  Rationale for Evaluation and Treatment Rehabilitation  PERTINENT HISTORY: From initial eval: Christian Hughes is a 62 y.o. male without significant past medical history except for occasional vaping amd marijuana use, who presents with fall and left hip pain.  Pt states that he slipped and fell at work  at about 11:00 AM.  Denies loss of consciousness.  He injured his left hip, causing left hip pain, which is constant, sharp, severe, nonradiating, aggravated by movement.  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    "My mom took care of me after my hip surgery. I had therapy at home. I returned to my home alone after my first follow up appointment. I have pain in my buttocks, groin and the corner of my buttocks of about 2 to 3/10. I have high pain tolerance. I am unable to walk normal and I feel weak. I began to use this cane on my own after my PT at home stopped.  I can't wait to go to work because, I love to work."   PAIN:  Are you having pain? Yes: Pain location: 2-3/10 Pain description: stabbing Aggravating factors: Moving, walking, WB   Relieving factors: rest, OTC meds, ice    WEIGHT BEARING RESTRICTIONS: Yes:  WBAT   FALLS:  Has patient fallen in last 6 months? Yes. 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 Pt negotiates some small staircases in town. Pt has historically taken 24-packs of water bottles to his work for customers.    PLOF: Independent   PATIENT GOALS: "I want to return to work without hurting and falling as soon as I can."   NEXT MD VISIT: 11/22/2022    PRECAUTIONS: Posterior hip precautions   L hip hemiarthroplasty following L femoral neck fracture (DOS: 10/07/22)    SUBJECTIVE:                                                                                                                                                                                       SUBJECTIVE STATEMENT:  Patient reports ongoing unsteadiness with walking and some remaining pain in lateral hip that is continuous. He feels he is experiencing mild levels of pain and feels he is able to participate well with PT.    PAIN:  Are you having pain? 1/10 Pain location: L groin, L posterolateral hip   OBJECTIVE: (objective measures completed at initial evaluation unless otherwise dated)   PATIENT SURVEYS:  LEFS 18/80 FOTO 36   COGNITION: Overall cognitive status: Within functional limits for tasks assessed and impulsive.                            SENSATION: WFL   MUSCLE LENGTH: deferred  test 2/2 to pain.    POSTURE: rounded shoulders   PALPATION: Pain in L sacral border, Iliac crest, gluteus medius, TFL, and greater trochanter     LOWER EXTREMITY ROM:    Active ROM Right eval Left eval  Hip flexion WFL 20 deg with pain  Hip extension [facility]    Hip abduction WFL 20 with pain   Hip adduction [facility]    Hip internal rotation [facility]    Hip external rotation [facility]    Knee flexion [facility]  Knee extension [facility]  Ankle dorsiflexion [facility]  Ankle plantarflexion [facility]  Ankle inversion [facility]  Ankle eversion WFL WFL   (Blank rows = not tested)   LOWER EXTREMITY MMT:   MMT Right eval Left eval Left 12/04/22 Left 12/18/22  Hip flexion WFL 2+ 4* 4-  Hip extension [facility] 2 3+   Hip abduction [facility] 2 4-   Hip adduction WFl      Hip internal rotation [facility]      Hip external rotation [facility]      Knee flexion WFL 3- 3+ 4-  Knee extension [facility]  3 4+ 4*  Ankle dorsiflexion WFL 3+    Ankle plantarflexion WFL 4    Ankle inversion WFL 4    Ankle eversion WFL 3-     (Blank rows = not tested) Pt's L ankle remains inverted 10 degrees since the Surgery.     FUNCTIONAL TESTS (INITIAL EVALUATION) 30 seconds chair stand test: 5 reps completed Timed up and go (TUG): 19secs 10 meter walk test: 12secs= 1,2 m/secs              Balance Test : SL standing unable, Modified tandem 10 secs, Tandem Unable, Rhomberg with EO 5 secs and unable with EC.  GAIT: Distance walked: 30 ft  Assistive device utilized: Single point cane Level of assistance: SBA Comments: Pt unsteady on feet with severe antalgic gait, decreased loading response, absent heel strike and absent toe off gait with uneven stride and decreased speed. Heavy reliance on the [facility]       TODAY'S TREATMENT:                                                                                                                              DATE: 12/21/2022     Therapeutic Exercise - for improved soft tissue flexibility and extensibility as needed for ROM, improved strength as needed to improve performance of CKC activities/functional movements   Sidelying hip abduction; 2x6 Bridge; 2x12;  Black Tband   -verbal and tactile cueing for technique Sit to stand; 4x5 from treatment table with Goblet hold, 6-lb Medball hold  Toe tapping; 2x10 alternating RL with 6-inch step  -BUE support with progression to unilateral R UE support  -Mirror feedback to reduce compensated Trendelenburg Partial squat, 0-60 deg knee flexion; 2x10 Semitandem stance (3/4 Tandem); 2 x 30 ea side     *not today* // bars side stepping; x 5 D/B each for length of // bars 3-way hip; x 8 ea dir; bilat LE; single hand on treadmill armrest for balance Seated knee extension, 7.5-lb ankle weights; 2x10 alternating R/L SLR attempted   -groin pain and difficulty initiating lift      Manual Therapy - for symptom modulation, soft tissue sensitivity and mobility, joint mobility, ROM    *not today* STM and IASTM with Theraband roller along L hip flexors, L proximal quads x 5 minutes Gentle L hip PROM within limits of posterior hip precautions; x 2 minutes  -Pt tolerates hip ER WNL, hip flexion to 80 deg, hip ABD to 20 deg STM and IASTM with Hypervolt along L gluteus medius/maximus; x 5 minutes    -limited tolerance with direct oscillation of Hypervolt adjacent to greater trochanter    Cold pack (unbilled) - for anti-inflammatory and analgesic effect as needed for reduced pain and improved ability to participate in active PT intervention, along L hip with pt in R sidelying, x 5 minutes     PATIENT EDUCATION:  Education details: Pt education about findings, rehab process, significance of fall  prevention and use of FWW  and anatomy.  Person educated: Patient Education method: Medical illustrator Education comprehension: verbalized understanding   HOME EXERCISE PROGRAM: Access Code: [email or web address] URL: [email or web address] Date: 12/14/2022 Prepared by: Consuela Mimes  Exercises - Sit to Stand  - 1 x daily - 7 x weekly - 2 sets - 10 reps - Supine Bridge with Resistance Band  - 1 x daily - 7 x weekly - 2 sets - 10 reps - Sidelying Hip Abduction  - 1 x daily - 7 x weekly - 2 sets - 10 reps - Standing 3-Way Kick  - 1 x daily - 7 x weekly - 2 sets - 10 reps     ASSESSMENT:   CLINICAL IMPRESSION: Patient has notable tenderness to palpation along greater trochanter on L side and has tolerated STM/manual work in this area poorly. We discussed use of ice at home for analgesic effect for possible comorbid bursitis. Bony healing is expected at this timeframe following patient's hemiarthroplasty. We are continuing work on low impact hip strengthening and progressive weight shifting drills as well as stepping drills with minimal UE assist. Patient has remaining deficits in hip ROM, LLE strength, LLE weightbearing, gait changes/antalgic pattern, and pain (intermittently affecting L groin and/or L posterolateral hip/gluteus medius region). Patient will benefit from continued skilled therapeutic intervention to address the above deficits as needed for improved function and QoL.      OBJECTIVE IMPAIRMENTS: Abnormal gait, decreased activity tolerance, decreased balance,  decreased endurance, decreased knowledge of condition, decreased knowledge of use of DME, decreased mobility, difficulty walking, decreased ROM, decreased strength, and dizziness.    ACTIVITY LIMITATIONS: carrying, lifting, bending, sitting, standing, squatting, sleeping, stairs, transfers, dressing, and locomotion level   PARTICIPATION LIMITATIONS: cleaning, shopping, community activity, occupation, and yard work   PERSONAL FACTORS:  Pt is impulsive and eager to get better so that he can return to work.   are also affecting patient's functional outcome.    REHAB POTENTIAL: Good   CLINICAL DECISION MAKING: Stable/uncomplicated   EVALUATION COMPLEXITY: Low     GOALS: Goals reviewed with patient? Yes   SHORT TERM GOALS: Target date: 12/22/2022 Pt will improve L hip ROM to 10 to 90 degrees without pain in order to promote normal gait and stability.  Baseline: 20 degree Hip felx and 0 deg Ext 12/04/22: 90 deg hip flex and 5 deg EXT 12/18/22: 90 deg hip flex and 10 deg hip EXT Goal status: ACHIEVED    2.  Pt will be able to ambulate 574ft safely with Cane without fall and pain . Baseline: 30 ft with SBA and heavy reliance on [facility].   12/04/22: Pt uses SPC for primary means of mobility in home and community at this time.  Goal status: ACHIEVED       LONG TERM GOALS: Target date: 01/26/2023   Pt will Improve TUG time to <10secs without SPC to demonstrate decreased fall risk.  Baseline: 19 secs with SPC 12/04/22: 14.6 seconds with SPC 12/18/22: 9.5 sec Goal status: ACHIEVED   2.  Pt will Improve LEFS score to >65/80 to demonstrate improved QoL Baseline: 18/80 12/04/22: 20/80  12/18/22: 22/80 Goal status: NOT MET    3.  Pt will be able to ambulate Independently  with LRAD without LOB and pain in order to return to community level mobility safely  Baseline: With [facility] and FWW.   12/04/22: ModI ambulation with SPC/LRAD with mild pain at longer distances, no LOB 12/18/22: Amb IND with SPC  with no  LOB Goal status: ACHIEVED    4.  Pt will score >50 on FOTO Baseline: 36/56 12/04/22: 35/56 12/18/22: 37/56 Goal status: NOT MET    5.  Pt will demonstrate 4/5 L hip strength without pain to improve stability and safety with all functional mobility in order to return to work.  Baseline: 2/5 grossly    12/04/22: 4-/5 grossly 12/18/22: 4-/5 grossly Goal status: IN PROGRESS          PLAN:   PT FREQUENCY: 2x/week   PT DURATION: 4 weeks   PLANNED INTERVENTIONS: Therapeutic exercises, Therapeutic activity, Neuromuscular re-education, Balance training, Gait training, Patient/Family education, Self Care, and Joint mobilization   PLAN FOR NEXT SESSION: Progressive gait and stepping activity with gradual AD wean, weight shifting work to improve standing tolerance on surgical LE, progressive LE strengthening. Manual therapy as needed for anterior hip and/or posterolateral hip pain.    Consuela Mimes, PT, DPT #[id number] Gertie Exon, PT 12/21/2022, 3:23 PM

## 2022-12-26 ENCOUNTER — Encounter: Payer: Self-pay | Admitting: Physical Therapy

## 2022-12-26 ENCOUNTER — Ambulatory Visit: Payer: Worker's Compensation | Admitting: Physical Therapy

## 2022-12-27 ENCOUNTER — Encounter: Payer: Self-pay | Admitting: Physical Therapy

## 2022-12-27 ENCOUNTER — Ambulatory Visit: Payer: Worker's Compensation

## 2022-12-27 DIAGNOSIS — R2689 Other abnormalities of gait and mobility: Secondary | ICD-10-CM

## 2022-12-27 DIAGNOSIS — Z96642 Presence of left artificial hip joint: Secondary | ICD-10-CM | POA: Diagnosis not present

## 2022-12-27 DIAGNOSIS — R262 Difficulty in walking, not elsewhere classified: Secondary | ICD-10-CM

## 2022-12-27 DIAGNOSIS — R29898 Other symptoms and signs involving the musculoskeletal system: Secondary | ICD-10-CM

## 2022-12-27 NOTE — Therapy (Signed)
OUTPATIENT PHYSICAL THERAPY TREATMENT    Patient Name: Christian Hughes MRN: [id number] DOB:[birth year], 62 y.o., male Today's Date: 12/27/2022  END OF SESSION:   PT End of Session - 12/27/22 1317     Visit Number 12    Number of Visits 20    Date for PT Re-Evaluation 12/22/22    PT Start Time 1330    PT Stop Time 1412    PT Time Calculation (min) 42 min    Activity Tolerance Patient tolerated treatment well;Patient limited by pain    Behavior During Therapy [facility] for tasks assessed/performed;Impulsive              Past Medical History:  Diagnosis Date   Engages in vaping    H/O ventral hernia    S/p of repair   Marijuana use    Orthostatic dizziness 10/08/2022   Past Surgical History:  Procedure Laterality Date   HIP ARTHROPLASTY Left 10/07/2022   Procedure: ARTHROPLASTY BIPOLAR HIP (HEMIARTHROPLASTY);  Surgeon: Juanell Fairly, MD;  Location: [facility] ORS;  Service: Orthopedics;  Laterality: Left;   Ventral hernia repair     Patient Active Problem List   Diagnosis Date Noted   Acute urinary retention 10/09/2022   Leukocytosis 10/08/2022   Closed left hip fracture, initial encounter (HCC) 10/06/2022   Fall 10/06/2022   Abnormal EKG 10/06/2022   Normocytic anemia 10/06/2022   Bone lesion_sclerotic lesions in the right iliac bone 10/06/2022   Elevated blood pressure reading 10/06/2022    PCP: Dewaine Oats MD  REFERRING PROVIDER: Juanell Fairly MD   REFERRING DIAG: [phone] (ICD-10-CM) - Presence of left artificial hip joint   THERAPY DIAG:  History of left hip hemiarthroplasty  Weakness of left leg  Balance problems  Difficulty in walking, not elsewhere classified  Rationale for Evaluation and Treatment Rehabilitation  PERTINENT HISTORY: From initial eval: Christian Hughes is a 62 y.o. male without significant past medical history except for occasional vaping amd marijuana use, who presents with fall and left hip pain.  Pt states that he slipped and fell at work  at about 11:00 AM.  Denies loss of consciousness.  He injured his left hip, causing left hip pain, which is constant, sharp, severe, nonradiating, aggravated by movement.  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    "My mom took care of me after my hip surgery. I had therapy at home. I returned to my home alone after my first follow up appointment. I have pain in my buttocks, groin and the corner of my buttocks of about 2 to 3/10. I have high pain tolerance. I am unable to walk normal and I feel weak. I began to use this cane on my own after my PT at home stopped.  I can't wait to go to work because, I love to work."   PAIN:  Are you having pain? Yes: Pain location: 2-3/10 Pain description: stabbing Aggravating factors: Moving, walking, WB   Relieving factors: rest, OTC meds, ice    WEIGHT BEARING RESTRICTIONS: Yes:  WBAT   FALLS:  Has patient fallen in last 6 months? Yes. 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 Pt negotiates some small staircases in town. Pt has historically taken 24-packs of water bottles to his work for customers.    PLOF: Independent   PATIENT GOALS: "I want to return to work without hurting and falling as soon as I can."   NEXT MD VISIT: 11/22/2022    PRECAUTIONS: Posterior hip precautions   L hip hemiarthroplasty following L femoral neck fracture (DOS: 10/07/22)    SUBJECTIVE:                                                                                                                                                                                       SUBJECTIVE STATEMENT:  Pt reports 1/10 pain in L hip primarily groin. Still reliant on [facility]. Denies falls.    PAIN:  Are you having pain? 1/10 Pain location: L groin, L posterolateral hip   OBJECTIVE: (objective measures completed at initial evaluation unless otherwise dated)   PATIENT SURVEYS:  LEFS 18/80 FOTO 36   COGNITION: Overall cognitive status: Within functional limits for tasks assessed and impulsive.                            SENSATION: WFL   MUSCLE LENGTH: deferred  test 2/2 to pain.    POSTURE: rounded shoulders   PALPATION: Pain in L sacral border, Iliac crest, gluteus medius, TFL, and greater trochanter     LOWER EXTREMITY ROM:    Active ROM Right eval Left eval  Hip flexion WFL 20 deg with pain  Hip extension [facility]    Hip abduction WFL 20 with pain   Hip adduction [facility]    Hip internal rotation [facility]    Hip external rotation [facility]    Knee flexion [facility]  Knee extension [facility]  Ankle dorsiflexion [facility]  Ankle plantarflexion [facility]  Ankle inversion [facility]  Ankle eversion WFL WFL   (Blank rows = not tested)   LOWER EXTREMITY MMT:   MMT Right eval Left eval Left 12/04/22 Left 12/18/22  Hip flexion WFL 2+ 4* 4-  Hip extension [facility] 2 3+   Hip abduction [facility] 2 4-   Hip adduction [facility]      Hip internal rotation [facility]      Hip external rotation [facility]      Knee flexion WFL 3- 3+ 4-  Knee extension WFl 3 4+ 4*  Ankle dorsiflexion WFL 3+    Ankle plantarflexion WFL 4    Ankle inversion  WFL 4    Ankle eversion WFL 3-     (Blank rows = not tested) Pt's L ankle remains inverted 10 degrees since the Surgery.     FUNCTIONAL TESTS (INITIAL EVALUATION) 30 seconds chair stand test: 5 reps completed Timed up and go (TUG): 19secs 10 meter walk test: 12secs= 1,2 m/secs             Balance Test : SL standing unable, Modified tandem 10 secs, Tandem Unable, Rhomberg with EO 5 secs and unable with EC.   GAIT: Distance walked: 30 ft  Assistive device utilized: Single point cane Level of assistance: SBA Comments: Pt unsteady on feet with severe antalgic gait, decreased loading response, absent heel strike and absent toe off gait with uneven stride and decreased speed. Heavy reliance on the [facility]       TODAY'S TREATMENT:                                                                                                                              DATE: 12/27/2022     Therapeutic Exercise - for improved soft tissue flexibility and extensibility as needed for ROM, improved strength as needed to improve performance of CKC activities/functional movements   Sidelying hip abduction; 3x6, min to mod VC's for neutral trunk positioning with good carryover.   Bridge; 2x12;  Black Tband with good carryover in form/technique.   Sit to stand; 4x5 from treatment table with 6 lbs Medball hold. First two sets with table at lowest setting, last two sets with table slightly elevated to 20"  Toe tapping; 2x10 alternating RL with 6-inch step, no UE support.   Lateral 6" side step ups: 2x6/LE, SUE support  Partial squat, 0-60 deg knee flexion; 2x10  Semitandem stance (3/4 Tandem); 2 x 30 with LLE under BOS    PATIENT EDUCATION:  Education details: Pt education about findings, rehab process, significance of fall prevention and use of FWW  and anatomy.  Person educated: Patient Education method: Medical illustrator Education comprehension: verbalized understanding   HOME EXERCISE PROGRAM: Access Code: [email or web address] URL: [email or web address] Date: 12/14/2022 Prepared by: Consuela Mimes  Exercises - Sit to Stand  - 1 x daily - 7 x weekly - 2 sets - 10 reps - Supine Bridge with Resistance Band  - 1 x daily - 7 x weekly - 2 sets - 10 reps - Sidelying Hip Abduction  - 1 x daily - 7 x weekly - 2 sets - 10 reps - Standing 3-Way Kick  - 1 x daily - 7 x weekly - 2 sets - 10 reps      ASSESSMENT:   CLINICAL IMPRESSION: Continuing PT POC with focus on LLE weight acceptance, normalized gait, and LLE strength. Pt with good ability to reduce to SUE to no UE support today with LLE WB exercises. Pt with good tolerance and motivation for OKC and CKC exercise today also. Pt does continue to rely on R  sided weight shifts to offload LLE with CKC exercise relying on mod multimodal cuing to correct. Patient will benefit from continued skilled therapeutic intervention to address the above deficits as needed for improved function and QoL.      OBJECTIVE IMPAIRMENTS: Abnormal gait, decreased activity tolerance, decreased balance, decreased endurance, decreased knowledge of condition, decreased knowledge of use of DME, decreased mobility, difficulty walking, decreased ROM, decreased strength, and dizziness.    ACTIVITY LIMITATIONS: carrying, lifting, bending, sitting, standing, squatting, sleeping, stairs, transfers, dressing, and locomotion level   PARTICIPATION LIMITATIONS: cleaning, shopping, community activity, occupation, and yard work   PERSONAL FACTORS:  Pt is impulsive and eager to get better so that he can return to work.   are also affecting patient's functional outcome.    REHAB POTENTIAL: Good   CLINICAL DECISION MAKING: Stable/uncomplicated   EVALUATION COMPLEXITY: Low     GOALS: Goals reviewed with patient? Yes   SHORT TERM GOALS: Target date: 12/22/2022 Pt will improve L hip ROM to 10 to 90 degrees without pain in order to promote normal gait and stability.  Baseline: 20 degree Hip felx and 0 deg Ext 12/04/22: 90 deg hip flex and 5 deg EXT 12/18/22: 90 deg hip flex and 10 deg hip EXT Goal status: ACHIEVED    2.  Pt will be able to ambulate 575ft safely with Cane without fall and pain . Baseline: 30 ft with SBA and heavy reliance on [facility]   12/04/22: Pt uses SPC for primary means of mobility in home and community at this time.  Goal status: ACHIEVED       LONG  TERM GOALS: Target date: 01/26/2023   Pt will Improve TUG time to <10secs without SPC to demonstrate decreased fall risk.  Baseline: 19 secs with SPC 12/04/22: 14.6 seconds with SPC 12/18/22: 9.5 sec Goal status: ACHIEVED   2.  Pt will Improve LEFS score to >65/80 to demonstrate improved QoL Baseline: 18/80 12/04/22: 20/80  12/18/22: 22/80 Goal status: NOT MET    3.  Pt will be able to ambulate Independently  with LRAD without LOB and pain in order to return to community level mobility safely  Baseline: With [facility] and FWW.   12/04/22: ModI ambulation with SPC/LRAD with mild pain at longer distances, no LOB 12/18/22: Amb IND with SPC with no LOB Goal status: ACHIEVED    4.  Pt will score >50 on FOTO Baseline: 36/56 12/04/22: 35/56 12/18/22: 37/56 Goal status: NOT MET    5.  Pt will demonstrate 4/5 L hip strength without pain to improve stability and safety with all functional mobility in order to return to work.  Baseline: 2/5 grossly    12/04/22: 4-/5 grossly 12/18/22: 4-/5 grossly Goal status: IN PROGRESS          PLAN:   PT FREQUENCY: 2x/week   PT DURATION: 4 weeks   PLANNED INTERVENTIONS: Therapeutic exercises, Therapeutic activity, Neuromuscular re-education, Balance training, Gait training, Patient/Family education, Self Care, and Joint mobilization   PLAN FOR NEXT SESSION: Progressive gait and stepping activity with gradual AD wean, weight shifting work to improve standing tolerance on surgical LE, progressive LE strengthening. Manual therapy as needed for anterior hip and/or posterolateral hip pain.    Delphia Grates. Fairly IV, PT, DPT Physical Therapist- [place]  [facility]  12/27/2022, 3:04 PM

## 2022-12-28 ENCOUNTER — Ambulatory Visit: Payer: Worker's Compensation

## 2022-12-28 ENCOUNTER — Encounter: Payer: Self-pay | Admitting: Physical Therapy

## 2022-12-28 DIAGNOSIS — Z96642 Presence of left artificial hip joint: Secondary | ICD-10-CM

## 2022-12-28 DIAGNOSIS — R29898 Other symptoms and signs involving the musculoskeletal system: Secondary | ICD-10-CM

## 2022-12-28 DIAGNOSIS — R262 Difficulty in walking, not elsewhere classified: Secondary | ICD-10-CM

## 2022-12-28 DIAGNOSIS — R2689 Other abnormalities of gait and mobility: Secondary | ICD-10-CM

## 2022-12-28 NOTE — Therapy (Signed)
OUTPATIENT PHYSICAL THERAPY TREATMENT    Patient Name: Christian Hughes MRN: [id number] DOB:1960/09/27, 62 y.o., male Today's Date: 12/28/2022  END OF SESSION:   PT End of Session - 12/28/22 1507     Visit Number 13    Number of Visits 20    Date for PT Re-Evaluation 12/22/22    PT Start Time 1514    PT Stop Time 1558    PT Time Calculation (min) 44 min    Activity Tolerance Patient tolerated treatment well;Patient limited by pain    Behavior During Therapy [facility] for tasks assessed/performed;Impulsive              Past Medical History:  Diagnosis Date   Engages in vaping    H/O ventral hernia    S/p of repair   Marijuana use    Orthostatic dizziness 10/08/2022   Past Surgical History:  Procedure Laterality Date   HIP ARTHROPLASTY Left 10/07/2022   Procedure: ARTHROPLASTY BIPOLAR HIP (HEMIARTHROPLASTY);  Surgeon: Christian Fairly, MD;  Location: [facility] ORS;  Service: Orthopedics;  Laterality: Left;   Ventral hernia repair     Patient Active Problem List   Diagnosis Date Noted   Acute urinary retention 10/09/2022   Leukocytosis 10/08/2022   Closed left hip fracture, initial encounter (HCC) 10/06/2022   Fall 10/06/2022   Abnormal EKG 10/06/2022   Normocytic anemia 10/06/2022   Bone lesion_sclerotic lesions in the right iliac bone 10/06/2022   Elevated blood pressure reading 10/06/2022    PCP: Christian Oats MD  REFERRING PROVIDER: Juanell Fairly MD   REFERRING DIAG: [phone] (ICD-10-CM) - Presence of left artificial hip joint   THERAPY DIAG:  History of left hip hemiarthroplasty  Weakness of left leg  Balance problems  Difficulty in walking, not elsewhere classified  Rationale for Evaluation and Treatment Rehabilitation  PERTINENT HISTORY: From initial eval: Christian Hughes is a 62 y.o. male without significant past medical history except for occasional vaping amd marijuana use, who presents with fall and left hip pain.  Pt states that he slipped and fell at work  at about 11:00 AM.  Denies loss of consciousness.  He injured his left hip, causing left hip pain, which is constant, sharp, severe, nonradiating, aggravated by movement.  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    "My mom took care of me after my hip surgery. I had therapy at home. I returned to my home alone after my first follow up appointment. I have pain in my buttocks, groin and the corner of my buttocks of about 2 to 3/10. I have high pain tolerance. I am unable to walk normal and I feel weak. I began to use this cane on my own after my PT at home stopped.  I can't wait to go to work because, I love to work."   PAIN:  Are you having pain? Yes: Pain location: 2-3/10 Pain description: stabbing Aggravating factors: Moving, walking, WB   Relieving factors: rest, OTC meds, ice    WEIGHT BEARING RESTRICTIONS: Yes:  WBAT   FALLS:  Has patient fallen in last 6 months? Yes. 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 Pt negotiates some small staircases in town. Pt has historically taken 24-packs of water bottles to his work for customers.    PLOF: Independent   PATIENT GOALS: "I want to return to work without hurting and falling as soon as I can."   NEXT MD VISIT: 11/22/2022    PRECAUTIONS: Posterior hip precautions   L hip hemiarthroplasty following L femoral neck fracture (DOS: 10/07/22)    SUBJECTIVE:                                                                                                                                                                                       SUBJECTIVE STATEMENT: Pt reports mild soreness from yesterday's session. Pain remains roughly 1-1.5/10 NPS in L groin.    PAIN:  Are you having pain? 1/10 Pain location: L groin, L posterolateral hip   OBJECTIVE: (objective measures completed at initial evaluation unless otherwise dated)   PATIENT SURVEYS:  LEFS 18/80 FOTO 36   COGNITION: Overall cognitive status: Within functional limits for tasks assessed and impulsive.                            SENSATION: WFL   MUSCLE LENGTH: deferred  test 2/2 to pain.    POSTURE: rounded shoulders   PALPATION: Pain in L sacral border, Iliac crest, gluteus medius, TFL, and greater trochanter     LOWER EXTREMITY ROM:    Active ROM Right eval Left eval  Hip flexion WFL 20 deg with pain  Hip extension [facility]    Hip abduction WFL 20 with pain   Hip adduction [facility]    Hip internal rotation [facility]    Hip external rotation [facility]    Knee flexion [facility]  Knee extension [facility]  Ankle dorsiflexion [facility]  Ankle plantarflexion [facility]  Ankle inversion [facility]  Ankle eversion WFL WFL   (Blank rows = not tested)   LOWER EXTREMITY MMT:   MMT Right eval Left eval Left 12/04/22 Left 12/18/22  Hip flexion WFL 2+ 4* 4-  Hip extension [facility] 2 3+   Hip abduction [facility] 2 4-   Hip adduction [facility]      Hip internal rotation [facility]      Hip external rotation [facility]      Knee flexion WFL 3- 3+ 4-  Knee extension WFl 3 4+ 4*  Ankle dorsiflexion WFL 3+    Ankle plantarflexion WFL 4    Ankle inversion [facility]  4    Ankle eversion WFL 3-     (Blank rows = not tested) Pt's L ankle remains inverted 10 degrees since the Surgery.     FUNCTIONAL TESTS (INITIAL EVALUATION) 30 seconds chair stand test: 5 reps completed Timed up and go (TUG): 19secs 10 meter walk test: 12secs= 1,2 m/secs             Balance Test : SL standing unable, Modified tandem 10 secs, Tandem Unable, Rhomberg with EO 5 secs and  unable with EC.  GAIT: Distance walked: 30 ft  Assistive device utilized: Single point cane Level of assistance: SBA Comments: Pt unsteady on feet with severe antalgic gait, decreased loading response, absent heel strike and absent toe off gait with uneven stride and decreased speed. Heavy reliance on the [facility]       TODAY'S TREATMENT:                                                                                                                              DATE: 12/28/2022     Therapeutic Exercise - for improved soft tissue flexibility and extensibility as needed for ROM, improved strength as needed to improve performance of CKC activities/functional movements  Nu-Step L3. Seat at 11 to maintain posterior hip flexion precautions. 5 min, L3 for warm up  Sidelying hip abduction; 4x6, no VC's for neutral trunk positioning today. Good carryover from yesterday's session.  Bridge 3x12:  Black Tband at distal femurs with good carryover in form/technique.   Sit to stand: 1x5 from treatment table with 6 lbs Med ball. table slightly elevated to 22" due to increased pain/soreness from back to back sessions.   Side steps on airex beam in // bars: x6 laps, SBA. VC's for mirror feedback to promote upright posture to reduce looking at feet in turn leading to anterior LOB. Intermittent need for UE support anteriorly due to heavy ankle/hip righting reactions.   Tandem gait in // bars: x6 laps, SBA  Toe tapping; 1x10 alternating R/L with 6-inch step, no UE support.     PATIENT EDUCATION:  Education details: Pt education about findings, rehab process, significance of fall prevention and use of FWW  and anatomy.  Person educated: Patient Education method: Medical illustrator Education comprehension: verbalized understanding   HOME EXERCISE PROGRAM: Access Code: [email or web address] URL: [email or web address] Date: 12/14/2022 Prepared by: Consuela Mimes  Exercises - Sit to Stand   - 1 x daily - 7 x weekly - 2 sets - 10 reps - Supine Bridge with Resistance Band  - 1 x daily - 7 x weekly - 2 sets - 10 reps - Sidelying Hip Abduction  - 1 x daily - 7 x weekly - 2 sets - 10 reps - Standing 3-Way Kick  - 1 x daily - 7 x weekly - 2 sets - 10 reps     ASSESSMENT:   CLINICAL IMPRESSION:  Pt presenting with soreness and mildly elevated pain levels  from back to back PT sessions.PT utilizing CKC reduced BOS and foam surfaces for hip stability/strengthening used to reduced load to surgical site with success. Pt encouraged to take 1-2 days off from HEP to allow for muscle recovery. Patient will benefit from continued skilled therapeutic intervention to address the above deficits as needed for improved function and QoL.      OBJECTIVE IMPAIRMENTS: Abnormal gait, decreased activity tolerance, decreased balance, decreased endurance, decreased knowledge of condition, decreased knowledge of use of DME, decreased mobility, difficulty walking, decreased ROM, decreased strength, and dizziness.    ACTIVITY LIMITATIONS: carrying, lifting, bending, sitting, standing, squatting, sleeping, stairs, transfers, dressing, and locomotion level   PARTICIPATION LIMITATIONS: cleaning, shopping, community activity, occupation, and yard work   PERSONAL FACTORS:  Pt is impulsive and eager to get better so that he can return to work.   are also affecting patient's functional outcome.    REHAB POTENTIAL: Good   CLINICAL DECISION MAKING: Stable/uncomplicated   EVALUATION COMPLEXITY: Low     GOALS: Goals reviewed with patient? Yes   SHORT TERM GOALS: Target date: 12/22/2022 Pt will improve L hip ROM to 10 to 90 degrees without pain in order to promote normal gait and stability.  Baseline: 20 degree Hip felx and 0 deg Ext 12/04/22: 90 deg hip flex and 5 deg EXT 12/18/22: 90 deg hip flex and 10 deg hip EXT Goal status: ACHIEVED    2.  Pt will be able to ambulate 565ft safely with Cane without fall and  pain . Baseline: 30 ft with SBA and heavy reliance on [facility].   12/04/22: Pt uses SPC for primary means of mobility in home and community at this time.  Goal status: ACHIEVED       LONG TERM GOALS: Target date: 01/26/2023   Pt will Improve TUG time to <10secs without SPC to demonstrate decreased fall risk.  Baseline: 19 secs with SPC 12/04/22: 14.6 seconds with SPC 12/18/22: 9.5 sec Goal status: ACHIEVED   2.  Pt will Improve LEFS score to >65/80 to demonstrate improved QoL Baseline: 18/80 12/04/22: 20/80  12/18/22: 22/80 Goal status: NOT MET    3.  Pt will be able to ambulate Independently  with LRAD without LOB and pain in order to return to community level mobility safely  Baseline: With [facility] and FWW.   12/04/22: ModI ambulation with SPC/LRAD with mild pain at longer distances, no LOB 12/18/22: Amb IND with SPC with no LOB Goal status: ACHIEVED    4.  Pt will score >50 on FOTO Baseline: 36/56 12/04/22: 35/56 12/18/22: 37/56 Goal status: NOT MET    5.  Pt will demonstrate 4/5 L hip strength without pain to improve stability and safety with all functional mobility in order to return to work.  Baseline: 2/5 grossly    12/04/22: 4-/5 grossly 12/18/22: 4-/5 grossly Goal status: IN PROGRESS          PLAN:   PT FREQUENCY: 2x/week   PT DURATION: 4 weeks   PLANNED INTERVENTIONS: Therapeutic exercises, Therapeutic activity, Neuromuscular re-education, Balance training, Gait training, Patient/Family education, Self Care, and Joint mobilization   PLAN FOR NEXT SESSION: Progressive gait and stepping activity with gradual AD wean, weight shifting work to improve standing tolerance on surgical LE, progressive LE strengthening. Manual therapy as needed for anterior hip and/or posterolateral hip pain.    Delphia Grates. Hughes IV, PT, DPT Physical Therapist- [place]  [facility]  12/28/2022, 9:22 PM

## 2023-01-01 ENCOUNTER — Ambulatory Visit: Payer: Worker's Compensation | Attending: Orthopedic Surgery

## 2023-01-01 ENCOUNTER — Encounter: Payer: Self-pay | Admitting: Physical Therapy

## 2023-01-01 DIAGNOSIS — R29898 Other symptoms and signs involving the musculoskeletal system: Secondary | ICD-10-CM | POA: Insufficient documentation

## 2023-01-01 DIAGNOSIS — R262 Difficulty in walking, not elsewhere classified: Secondary | ICD-10-CM | POA: Diagnosis present

## 2023-01-01 DIAGNOSIS — Z96642 Presence of left artificial hip joint: Secondary | ICD-10-CM | POA: Diagnosis present

## 2023-01-01 DIAGNOSIS — R2689 Other abnormalities of gait and mobility: Secondary | ICD-10-CM | POA: Insufficient documentation

## 2023-01-01 NOTE — Therapy (Signed)
OUTPATIENT PHYSICAL THERAPY TREATMENT    Patient Name: Christian Hughes MRN: [id number] DOB:[birth year], 62 y.o., male Today's Date: 01/01/2023  END OF SESSION:   PT End of Session - 01/01/23 1551     Visit Number 14    Number of Visits 20    Date for PT Re-Evaluation 12/22/22    Authorization Type Workers Compensation    Authorization Time Period 12/18/22-02/22/23    Progress Note Due on Visit 20    PT Start Time 1545    PT Stop Time 1625    PT Time Calculation (min) 40 min    Activity Tolerance Patient tolerated treatment well;Patient limited by pain    Behavior During Therapy [facility] for tasks assessed/performed;Impulsive              Past Medical History:  Diagnosis Date   Engages in vaping    H/O ventral hernia    S/p of repair   Marijuana use    Orthostatic dizziness 10/08/2022   Past Surgical History:  Procedure Laterality Date   HIP ARTHROPLASTY Left 10/07/2022   Procedure: ARTHROPLASTY BIPOLAR HIP (HEMIARTHROPLASTY);  Surgeon: Juanell Fairly, MD;  Location: [facility] ORS;  Service: Orthopedics;  Laterality: Left;   Ventral hernia repair     Patient Active Problem List   Diagnosis Date Noted   Acute urinary retention 10/09/2022   Leukocytosis 10/08/2022   Closed left hip fracture, initial encounter (HCC) 10/06/2022   Fall 10/06/2022   Abnormal EKG 10/06/2022   Normocytic anemia 10/06/2022   Bone lesion_sclerotic lesions in the right iliac bone 10/06/2022   Elevated blood pressure reading 10/06/2022    PCP: Dewaine Oats MD  REFERRING PROVIDER: Juanell Fairly MD   REFERRING DIAG: [phone] (ICD-10-CM) - Presence of left artificial hip joint   THERAPY DIAG:  History of left hip hemiarthroplasty  Weakness of left leg  Balance problems  Difficulty in walking, not elsewhere classified  Rationale for Evaluation and Treatment Rehabilitation  PERTINENT HISTORY: From initial eval: Christian Hughes is a 62 y.o. male without significant past medical history except for  occasional vaping amd marijuana use, who presents with fall and left hip pain.  Pt states that he slipped and fell at work at about 11:00 AM.  Denies loss of consciousness.  He injured his left hip, causing left hip pain, which is constant, sharp, severe, nonradiating, aggravated by movement.  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    "My mom took care of me after my hip surgery. I had therapy at home. I returned to my home alone after my first follow up appointment. I have pain in my buttocks, groin and the corner of my buttocks of about 2 to 3/10. I have high pain tolerance. I am unable to walk normal and I feel weak. I began to use this cane on my own after my PT at home stopped.  I can't wait to go to work because, I love to work."   PAIN:  Are you having pain? Yes: Pain location: 2-3/10 Pain description: stabbing Aggravating factors: Moving, walking, WB   Relieving factors: rest, OTC meds, ice    WEIGHT BEARING RESTRICTIONS: Yes:  WBAT   FALLS:  Has patient fallen in last 6 months? Yes. 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 Pt negotiates some small staircases in town. Pt has historically taken 24-packs of water bottles to his work for customers.    PLOF: Independent   PATIENT GOALS: "I want to return to work without hurting and falling as soon as I can."   NEXT MD VISIT: 11/22/2022    PRECAUTIONS: Posterior hip precautions   L hip hemiarthroplasty following L femoral neck fracture (DOS:  10/07/22)    SUBJECTIVE:                                                                                                                                                                                      SUBJECTIVE STATEMENT: Pt sore on arrival, in car a lot today. Saw Dr. Martha Clan with good report.     PAIN:  Are you having pain? 2/10 Pain location: L groin, L posterolateral hip   OBJECTIVE:    TODAY'S TREATMENT:                                                                                                                              DATE: 01/01/2023   -AA/ROM on Nustep x5 minutes, arms too, level 0 to improve pain  -Chair + airex marching, alternating feet 1x20 -STS from chair + airex hands 1x5 (too high effort)  -Chair + airex marching, alternating feet 1x20 -STS from chair + 2 airex hands 1x9  -STS from chair + 2 airex hands 1x8 (narrow stance)   -lateral sidestepping in bars 2x bilat -chair + airex LLE LAQ 5lb 1x10x3secH  -standing knee flexion 1x10 5lb AW (looks too difficult to me)  -standing bilat heel raises 1x15, (hands on //bars) -chair + airex LLE LAQ 4lb 1x10x3secH (looks too difficult to me)  -standing knee flexion 1x10 4lb AW    PATIENT EDUCATION:  Education details: Pt education about findings, rehab  process, significance of fall prevention and use of FWW  and anatomy.  Person educated: Patient Education method: Medical illustrator Education comprehension: verbalized understanding   HOME EXERCISE PROGRAM: Access Code: [email or web address] URL: [email or web address] Date: 12/14/2022 Prepared by: Consuela Mimes  Exercises - Sit to Stand  - 1 x daily - 7 x weekly - 2 sets - 10 reps - Supine Bridge with Resistance Band  - 1 x daily - 7 x weekly - 2 sets - 10 reps - Sidelying Hip Abduction  - 1 x daily - 7 x weekly - 2 sets - 10 reps - Standing 3-Way Kick  - 1 x daily - 7 x weekly - 2 sets - 10 reps     ASSESSMENT:   CLINICAL  IMPRESSION: Pt continues to put forth good effort in general. He presents as though he continues to have a lot of joint effusion that has limited his comfort in sitting. He also remains quite weak which means Thereasa Parkin has to take measures to decrease loading intensity for targeted range. His antalgia creates secondary imbalance episodes which make balance balance precarious. Will continue to follow. Goals progressing but not yet met.      OBJECTIVE IMPAIRMENTS: Abnormal gait, decreased activity tolerance, decreased balance, decreased endurance, decreased knowledge of condition, decreased knowledge of use of DME, decreased mobility, difficulty walking, decreased ROM, decreased strength, and dizziness.    ACTIVITY LIMITATIONS: carrying, lifting, bending, sitting, standing, squatting, sleeping, stairs, transfers, dressing, and locomotion level   PARTICIPATION LIMITATIONS: cleaning, shopping, community activity, occupation, and yard work   PERSONAL FACTORS:  Pt is impulsive and eager to get better so that he can return to work.   are also affecting patient's functional outcome.    REHAB POTENTIAL: Good   CLINICAL DECISION MAKING: Stable/uncomplicated   EVALUATION COMPLEXITY: Low     GOALS: Goals reviewed with patient? Yes   SHORT TERM GOALS: Target date: 12/22/2022 Pt will improve L hip ROM to 10 to 90 degrees without pain in order to promote normal gait and stability.  Baseline: 20 degree Hip felx and 0 deg Ext 12/04/22: 90 deg hip flex and 5 deg EXT 12/18/22: 90 deg hip flex and 10 deg hip EXT Goal status: ACHIEVED    2.  Pt will be able to ambulate 539ft safely with Cane without fall and pain . Baseline: 30 ft with SBA and heavy reliance on [facility].   12/04/22: Pt uses SPC for primary means of mobility in home and community at this time.  Goal status: ACHIEVED       LONG TERM GOALS: Target date: 01/26/2023   Pt will Improve TUG time to <10secs without SPC to demonstrate decreased fall risk.   Baseline: 19 secs with SPC 12/04/22: 14.6 seconds with SPC 12/18/22: 9.5 sec Goal status: ACHIEVED   2.  Pt will Improve LEFS score to >65/80 to demonstrate improved QoL Baseline: 18/80 12/04/22: 20/80  12/18/22: 22/80 Goal status: NOT MET    3.  Pt will be able to ambulate Independently with LRAD without LOB and pain in order to return to community level mobility safely  Baseline: With [facility] and FWW.   12/04/22: ModI ambulation with SPC/LRAD with mild pain at longer distances, no LOB 12/18/22: Amb IND with SPC with no LOB Goal status: ACHIEVED    4.  Pt will score >50 on FOTO Baseline: 36/56 12/04/22: 35/56 12/18/22: 37/56 Goal status: NOT MET    5.  Pt will demonstrate 4/5 L hip strength without pain  to improve stability and safety with all functional mobility in order to return to work.  Baseline: 2/5 grossly    12/04/22: 4-/5 grossly 12/18/22: 4-/5 grossly Goal status: IN PROGRESS          PLAN:   PT FREQUENCY: 2x/week   PT DURATION: 4 weeks   PLANNED INTERVENTIONS: Therapeutic exercises, Therapeutic activity, Neuromuscular re-education, Balance training, Gait training, Patient/Family education, Self Care, and Joint mobilization   PLAN FOR NEXT SESSION: Progressive gait and stepping activity with gradual AD wean, weight shifting work to improve standing tolerance on surgical LE, progressive LE strengthening. Manual therapy as needed for anterior hip and/or posterolateral hip pain.    Delphia Grates. Fairly IV, PT, DPT Physical Therapist- [place]  [facility]  01/01/2023, 3:59 PM

## 2023-01-03 ENCOUNTER — Encounter: Payer: Self-pay | Admitting: Physical Therapy

## 2023-01-03 ENCOUNTER — Ambulatory Visit: Payer: Worker's Compensation | Admitting: Physical Therapy

## 2023-01-03 DIAGNOSIS — Z96642 Presence of left artificial hip joint: Secondary | ICD-10-CM | POA: Diagnosis not present

## 2023-01-03 DIAGNOSIS — R29898 Other symptoms and signs involving the musculoskeletal system: Secondary | ICD-10-CM

## 2023-01-03 DIAGNOSIS — R2689 Other abnormalities of gait and mobility: Secondary | ICD-10-CM

## 2023-01-03 DIAGNOSIS — R262 Difficulty in walking, not elsewhere classified: Secondary | ICD-10-CM

## 2023-01-03 NOTE — Therapy (Signed)
OUTPATIENT PHYSICAL THERAPY TREATMENT    Patient Name: Christian Hughes MRN: [id number] DOB:1960-10-02, 62 y.o., male Today's Date: 01/03/2023  END OF SESSION:   PT End of Session - 01/03/23 1545     Visit Number 15    Number of Visits 20    Date for PT Re-Evaluation 12/22/22    Authorization Type Workers Compensation    Authorization Time Period 12/18/22-02/22/23    Progress Note Due on Visit 20    PT Start Time 1547    PT Stop Time 1627    PT Time Calculation (min) 40 min    Activity Tolerance Patient tolerated treatment well;Patient limited by pain    Behavior During Therapy [facility] for tasks assessed/performed;Impulsive             Past Medical History:  Diagnosis Date   Engages in vaping    H/O ventral hernia    S/p of repair   Marijuana use    Orthostatic dizziness 10/08/2022   Past Surgical History:  Procedure Laterality Date   HIP ARTHROPLASTY Left 10/07/2022   Procedure: ARTHROPLASTY BIPOLAR HIP (HEMIARTHROPLASTY);  Surgeon: Juanell Fairly, MD;  Location: [facility] ORS;  Service: Orthopedics;  Laterality: Left;   Ventral hernia repair     Patient Active Problem List   Diagnosis Date Noted   Acute urinary retention 10/09/2022   Leukocytosis 10/08/2022   Closed left hip fracture, initial encounter (HCC) 10/06/2022   Fall 10/06/2022   Abnormal EKG 10/06/2022   Normocytic anemia 10/06/2022   Bone lesion_sclerotic lesions in the right iliac bone 10/06/2022   Elevated blood pressure reading 10/06/2022    PCP: Dewaine Oats MD  REFERRING PROVIDER: Juanell Fairly MD   REFERRING DIAG: [phone] (ICD-10-CM) - Presence of left artificial hip joint   THERAPY DIAG:  History of left hip hemiarthroplasty  Weakness of left leg  Balance problems  Difficulty in walking, not elsewhere classified  Rationale for Evaluation and Treatment Rehabilitation  PERTINENT HISTORY: From initial eval: Christian Hughes is a 62 y.o. male without significant past medical history except for  occasional vaping amd marijuana use, who presents with fall and left hip pain.  Pt states that he slipped and fell at work at about 11:00 AM.  Denies loss of consciousness.  He injured his left hip, causing left hip pain, which is constant, sharp, severe, nonradiating, aggravated by movement.  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    "My mom took care of me after my hip surgery. I had therapy at home. I returned to my home alone after my first follow up appointment. I have pain in my buttocks, groin and the corner of my buttocks of about 2 to 3/10. I have high pain tolerance. I am unable to walk normal and I feel weak. I began to use this cane on my own after my PT at home stopped.  I can't wait to go to work because, I love to work."   PAIN:  Are you having pain? Yes: Pain location: 2-3/10 Pain description: stabbing Aggravating factors: Moving, walking, WB   Relieving factors: rest, OTC meds, ice    WEIGHT BEARING RESTRICTIONS: Yes:  WBAT   FALLS:  Has patient fallen in last 6 months? Yes. Number of falls once once 3 months ago   LIVING ENVIRONMENT: Lives with: lives alone Lives in: Mobile home Stairs: Yes: External: 4 steps; can  reach both Has following equipment at home: Single point cane   OCCUPATION: Works in the office for [employer] [place] car [employer]. 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 Pt negotiates some small staircases in town. Pt has historically taken 24-packs of water bottles to his work for customers.    PLOF: Independent   PATIENT GOALS: "I want to return to work without hurting and falling as soon as I can."   NEXT MD VISIT: 11/22/2022    PRECAUTIONS: Posterior hip precautions   L hip hemiarthroplasty following L femoral neck fracture (DOS:  10/07/22)    SUBJECTIVE:                                                                                                                                                                                      SUBJECTIVE STATEMENT: Pt reports some discomfort in groin with inclement weather this AM. Pt visited Dr. Martha Clan this past Monday 01/01/23. He has not yet been released for work. He is to f/u with MD after his 02/14/23 PT appointment. Dr. Martha Clan stated that it may be up to 6 months for healing per pt report.    PAIN:  Are you having pain? 1.5/10 Pain location: L groin, L posterolateral hip   OBJECTIVE: (objective measures completed at initial evaluation unless otherwise dated)   PATIENT SURVEYS:  LEFS 18/80 FOTO 36   COGNITION: Overall cognitive status: Within functional limits for tasks assessed and impulsive.                            SENSATION: WFL   MUSCLE LENGTH: deferred  test 2/2 to pain.    POSTURE: rounded shoulders   PALPATION: Pain in L sacral border, Iliac crest, gluteus medius, TFL, and greater trochanter     LOWER EXTREMITY ROM:    Active ROM Right eval Left eval  Hip flexion WFL 20 deg with pain  Hip extension [facility]    Hip abduction WFL 20 with pain   Hip adduction [facility]    Hip internal rotation [facility]    Hip external rotation [facility]    Knee flexion [facility]  Knee extension [facility]  Ankle dorsiflexion [facility]  Ankle plantarflexion [facility]  Ankle inversion [facility]  Ankle eversion WFL WFL   (Blank rows = not tested)   LOWER EXTREMITY MMT:   MMT Right eval Left eval Left 12/04/22 Left 12/18/22  Hip flexion WFL 2+ 4* 4-  Hip extension [facility] 2 3+   Hip abduction [facility]  2 4-   Hip adduction [facility]      Hip internal rotation [facility]      Hip external rotation [facility]      Knee flexion WFL 3- 3+ 4-  Knee extension WFl 3 4+ 4*  Ankle dorsiflexion WFL 3+    Ankle plantarflexion WFL 4    Ankle inversion WFL 4    Ankle eversion WFL 3-     (Blank rows = not  tested) Pt's L ankle remains inverted 10 degrees since the Surgery.     FUNCTIONAL TESTS (INITIAL EVALUATION) 30 seconds chair stand test: 5 reps completed Timed up and go (TUG): 19secs 10 meter walk test: 12secs= 1,2 m/secs             Balance Test : SL standing unable, Modified tandem 10 secs, Tandem Unable, Rhomberg with EO 5 secs and unable with EC.  GAIT: Distance walked: 30 ft  Assistive device utilized: Single point cane Level of assistance: SBA Comments: Pt unsteady on feet with severe antalgic gait, decreased loading response, absent heel strike and absent toe off gait with uneven stride and decreased speed. Heavy reliance on the [facility]       TODAY'S TREATMENT:                                                                                                                              DATE: 01/03/2023     Therapeutic Exercise - for improved soft tissue flexibility and extensibility as needed for ROM, improved strength as needed to improve performance of CKC activities/functional movements  Nu-Step L3. Seat at 11 to maintain posterior hip flexion precautions. 5 min, L3 for warm up  Sidelying hip abduction; 4x6, no VC's for neutral trunk positioning today. Good carryover from yesterday's session.  Bridge 3x12:  Black Tband at distal femurs with good carryover in form/technique.   High knees 1x D/B length of // bars; mild antalgic pattern intermittently with stance on surgical LE  Sit to stand: 2 x 8; from chair + Airex  Side steps on airex beam in // bars: x6 laps, SBA. VC's for mirror feedback to promote upright posture to reduce looking at feet in turn leading to anterior LOB. Intermittent need for UE support anteriorly due to heavy ankle/hip righting reactions.   Toe tapping; 2x10 alternating R/L with 6-inch step, no UE support.   *next visit* Forward step up to Airex pad; 2x10 with either LE   PATIENT EDUCATION:  Education details: Pt education about findings, rehab  process, significance of fall prevention and use of FWW  and anatomy.  Person educated: Patient Education method: Medical illustrator Education comprehension: verbalized understanding   HOME EXERCISE PROGRAM: Access Code: [email or web address] URL: [email or web address] Date: 12/14/2022 Prepared by: Consuela Mimes  Exercises - Sit to Stand  - 1 x daily - 7 x weekly - 2 sets - 10 reps - Supine Bridge with Resistance Band  - 1 x daily - 7  x weekly - 2 sets - 10 reps - Sidelying Hip Abduction  - 1 x daily - 7 x weekly - 2 sets - 10 reps - Standing 3-Way Kick  - 1 x daily - 7 x weekly - 2 sets - 10 reps     ASSESSMENT:   CLINICAL IMPRESSION:  Patent has progressed well with wean to [facility]; he can negotiate home distance without AD, but antalgic pattern and compensated Trendelenburg is apparent without SPC to offload surgical LE. We are focusing primarily on progressive weightbearing activity and strengthening at this time. Pt is continuous low-level pain affecting L groin primarily. Pt had good f/u with his surgeon who stated that full healing may take up to 6 months post-op. Patient will benefit from continued skilled therapeutic intervention to address the above deficits as needed for improved function and QoL.      OBJECTIVE IMPAIRMENTS: Abnormal gait, decreased activity tolerance, decreased balance, decreased endurance, decreased knowledge of condition, decreased knowledge of use of DME, decreased mobility, difficulty walking, decreased ROM, decreased strength, and dizziness.    ACTIVITY LIMITATIONS: carrying, lifting, bending, sitting, standing, squatting, sleeping, stairs, transfers, dressing, and locomotion level   PARTICIPATION LIMITATIONS: cleaning, shopping, community activity, occupation, and yard work   PERSONAL FACTORS:  Pt is impulsive and eager to get better so that he can return to work.   are also affecting patient's functional outcome.    REHAB POTENTIAL:  Good   CLINICAL DECISION MAKING: Stable/uncomplicated   EVALUATION COMPLEXITY: Low     GOALS: Goals reviewed with patient? Yes   SHORT TERM GOALS: Target date: 12/22/2022 Pt will improve L hip ROM to 10 to 90 degrees without pain in order to promote normal gait and stability.  Baseline: 20 degree Hip felx and 0 deg Ext 12/04/22: 90 deg hip flex and 5 deg EXT 12/18/22: 90 deg hip flex and 10 deg hip EXT Goal status: ACHIEVED    2.  Pt will be able to ambulate 588ft safely with Cane without fall and pain . Baseline: 30 ft with SBA and heavy reliance on [facility].   12/04/22: Pt uses SPC for primary means of mobility in home and community at this time.  Goal status: ACHIEVED       LONG TERM GOALS: Target date: 01/26/2023   Pt will Improve TUG time to <10secs without SPC to demonstrate decreased fall risk.  Baseline: 19 secs with SPC 12/04/22: 14.6 seconds with SPC 12/18/22: 9.5 sec Goal status: ACHIEVED   2.  Pt will Improve LEFS score to >65/80 to demonstrate improved QoL Baseline: 18/80 12/04/22: 20/80  12/18/22: 22/80 Goal status: NOT MET    3.  Pt will be able to ambulate Independently  with LRAD without LOB and pain in order to return to community level mobility safely  Baseline: With [facility] and FWW.   12/04/22: ModI ambulation with SPC/LRAD with mild pain at longer distances, no LOB 12/18/22: Amb IND with SPC with no LOB Goal status: ACHIEVED    4.  Pt will score >50 on FOTO Baseline: 36/56 12/04/22: 35/56 12/18/22: 37/56 Goal status: NOT MET    5.  Pt will demonstrate 4/5 L hip strength without pain to improve stability and safety with all functional mobility in order to return to work.  Baseline: 2/5 grossly    12/04/22: 4-/5 grossly 12/18/22: 4-/5 grossly Goal status: IN PROGRESS          PLAN:   PT FREQUENCY: 2x/week   PT DURATION: 4 weeks  PLANNED INTERVENTIONS: Therapeutic exercises, Therapeutic activity, Neuromuscular re-education, Balance training, Gait training,  Patient/Family education, Self Care, and Joint mobilization   PLAN FOR NEXT SESSION: Progressive gait and stepping activity with gradual AD wean, weight shifting work to improve standing tolerance on surgical LE, progressive LE strengthening. Manual therapy as needed for anterior hip and/or posterolateral hip pain.     Consuela Mimes, PT, DPT [phone]  Physical Therapist- [facility]  01/03/2023, 3:47 PM

## 2023-01-08 ENCOUNTER — Encounter: Payer: Self-pay | Admitting: Physical Therapy

## 2023-01-08 ENCOUNTER — Ambulatory Visit: Payer: Worker's Compensation | Admitting: Physical Therapy

## 2023-01-08 DIAGNOSIS — Z96642 Presence of left artificial hip joint: Secondary | ICD-10-CM | POA: Diagnosis not present

## 2023-01-08 DIAGNOSIS — R2689 Other abnormalities of gait and mobility: Secondary | ICD-10-CM

## 2023-01-08 DIAGNOSIS — R262 Difficulty in walking, not elsewhere classified: Secondary | ICD-10-CM

## 2023-01-08 DIAGNOSIS — R29898 Other symptoms and signs involving the musculoskeletal system: Secondary | ICD-10-CM

## 2023-01-08 NOTE — Therapy (Signed)
OUTPATIENT PHYSICAL THERAPY TREATMENT    Patient Name: Christian Hughes MRN: [id number] DOB:[birth year], 62 y.o., male Today's Date: 01/08/2023  END OF SESSION:   PT End of Session - 01/08/23 1458     Visit Number 16    Number of Visits 20    Date for PT Re-Evaluation 12/22/22    Authorization Type Workers Compensation    Authorization Time Period 12/18/22-02/22/23    Progress Note Due on Visit 20    PT Start Time 1501    PT Stop Time 1550    PT Time Calculation (min) 49 min    Activity Tolerance Patient tolerated treatment well;Patient limited by pain    Behavior During Therapy [facility] for tasks assessed/performed;Impulsive              Past Medical History:  Diagnosis Date   Engages in vaping    H/O ventral hernia    S/p of repair   Marijuana use    Orthostatic dizziness 10/08/2022   Past Surgical History:  Procedure Laterality Date   HIP ARTHROPLASTY Left 10/07/2022   Procedure: ARTHROPLASTY BIPOLAR HIP (HEMIARTHROPLASTY);  Surgeon: Juanell Fairly, MD;  Location: [facility] ORS;  Service: Orthopedics;  Laterality: Left;   Ventral hernia repair     Patient Active Problem List   Diagnosis Date Noted   Acute urinary retention 10/09/2022   Leukocytosis 10/08/2022   Closed left hip fracture, initial encounter (HCC) 10/06/2022   Fall 10/06/2022   Abnormal EKG 10/06/2022   Normocytic anemia 10/06/2022   Bone lesion_sclerotic lesions in the right iliac bone 10/06/2022   Elevated blood pressure reading 10/06/2022    PCP: Dewaine Oats MD  REFERRING PROVIDER: Juanell Fairly MD   REFERRING DIAG: [id number] (ICD-10-CM) - Presence of left artificial hip joint   THERAPY DIAG:  History of left hip hemiarthroplasty  Weakness of left leg  Balance problems  Difficulty in walking, not elsewhere classified  Rationale for Evaluation and Treatment Rehabilitation  PERTINENT HISTORY: From initial eval: Christian Hughes is a 62 y.o. male without significant past medical history except for  occasional vaping amd marijuana use, who presents with fall and left hip pain.  Pt states that he slipped and fell at work at about 11:00 AM.  Denies loss of consciousness.  He injured his left hip, causing left hip pain, which is constant, sharp, severe, nonradiating, aggravated by movement.  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    "My mom took care of me after my hip surgery. I had therapy at home. I returned to my home alone after my first follow up appointment. I have pain in my buttocks, groin and the corner of my buttocks of about 2 to 3/10. I have high pain tolerance. I am unable to walk normal and I feel weak. I began to use this cane on my own after my PT at home stopped.  I can't wait to go to work because, I love to work."   PAIN:  Are you having pain? Yes: Pain location: 2-3/10 Pain description: stabbing Aggravating factors: Moving, walking, WB   Relieving factors: rest, OTC meds, ice    WEIGHT BEARING RESTRICTIONS: Yes:  WBAT   FALLS:  Has patient fallen in last 6 months? Yes. Number of falls once once 3 months ago   LIVING ENVIRONMENT: Lives with: lives alone Lives in: Mobile home Stairs: Yes: External: 4 steps;  can reach both Has following equipment at home: Single point cane   OCCUPATION: Works in the office for [employer] car [employer]. 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 Pt negotiates some small staircases in town. Pt has historically taken 24-packs of water bottles to his work for customers.    PLOF: Independent   PATIENT GOALS: "I want to return to work without hurting and falling as soon as I can."   NEXT MD VISIT: 11/22/2022    PRECAUTIONS: Posterior hip precautions   L hip hemiarthroplasty following L femoral neck fracture (DOS:  10/07/22)    SUBJECTIVE:                                                                                                                                                                                      SUBJECTIVE STATEMENT: Pt reports going to [employer] and that evening he had notable muscle spasms affecting L gluteal region and L lateral thigh. He started new Rx for muscle spasms; he states this helped last night. He feels this has improved with medication. Patient reports compliance with his HEP.    PAIN:  Are you having pain? 2-3/10 pain in groin, spasm intermittently affecting L lateral thigh Pain location: L groin, L posterolateral hip   OBJECTIVE: (objective measures completed at initial evaluation unless otherwise dated)   PATIENT SURVEYS:  LEFS 18/80 FOTO 36   COGNITION: Overall cognitive status: Within functional limits for tasks assessed and impulsive.                            SENSATION: WFL   MUSCLE LENGTH: deferred  test 2/2 to pain.    POSTURE: rounded shoulders   PALPATION: Pain in L sacral border, Iliac crest, gluteus medius, TFL, and greater trochanter     LOWER EXTREMITY ROM:    Active ROM Right eval Left eval  Hip flexion WFL 20 deg with pain  Hip extension [facility]    Hip abduction WFL 20 with pain   Hip adduction [facility]    Hip internal rotation [facility]    Hip external rotation [facility]    Knee flexion [facility]  Knee extension [facility]  Ankle dorsiflexion [facility]  Ankle plantarflexion [facility]  Ankle inversion [facility]  Ankle eversion WFL WFL   (Blank rows = not tested)   LOWER EXTREMITY MMT:   MMT Right eval Left eval Left 12/04/22 Left 12/18/22  Hip flexion WFL 2+ 4* 4-  Hip extension WFL 2 3+  Hip abduction [facility] 2 4-   Hip adduction [facility]      Hip internal rotation [facility]      Hip external rotation [facility]      Knee flexion WFL 3- 3+ 4-  Knee extension WFl 3 4+ 4*  Ankle dorsiflexion WFL 3+    Ankle plantarflexion WFL 4    Ankle  inversion WFL 4    Ankle eversion WFL 3-     (Blank rows = not tested) Pt's L ankle remains inverted 10 degrees since the Surgery.     FUNCTIONAL TESTS (INITIAL EVALUATION) 30 seconds chair stand test: 5 reps completed Timed up and go (TUG): 19secs 10 meter walk test: 12secs= 1,2 m/secs             Balance Test : SL standing unable, Modified tandem 10 secs, Tandem Unable, Rhomberg with EO 5 secs and unable with EC.  GAIT: Distance walked: 30 ft  Assistive device utilized: Single point cane Level of assistance: SBA Comments: Pt unsteady on feet with severe antalgic gait, decreased loading response, absent heel strike and absent toe off gait with uneven stride and decreased speed. Heavy reliance on the [facility]       TODAY'S TREATMENT:                                                                                                                              DATE: 01/09/2023   Manual Therapy - for symptom modulation, soft tissue sensitivity and mobility, joint mobility, ROM  Gentle L hip PROM within posterior hip precautions x 3 minutes -hip flexion to 90, hip abduction WFL, hip ER WFL, hip extension WNL STM/DTM L hip flexors/TFL, L gluteal mm x 8 minutes    Cold pack (unbilled) - for anti-inflammatory and analgesic effect as needed for reduced pain and improved ability to participate in active PT intervention, along L lateral thigh in R sidelying with pillow between knees for comfort, x 5 minutes    Therapeutic Exercise - for improved soft tissue flexibility and extensibility as needed for ROM, improved strength as needed to improve performance of CKC activities/functional movements  Nu-Step L3. Seat at 12 to maintain posterior hip flexion precautions. 5 min, L3 for warm up  High knees 3x D/B length of // bars  Side steps on airex beam in // bars: x6 laps, SBA. VC's for mirror feedback to promote upright posture to reduce looking at feet in turn leading to anterior LOB.  Intermittent need for UE support anteriorly due to heavy ankle/hip righting reactions.   Sidelying hip abduction; 3x8, no VC's for neutral trunk positioning today. Good carryover from yesterday's session.  Forward step up to Airex pad; 2x10 with either LE     *next visit* Sit to stand: 2 x 8; from chair + Airex Toe tapping; 2x10 alternating R/L with 6-inch step, no UE support.    *not today* Bridge 3x12:  Black Tband at distal femurs with good  carryover in form/technique.    PATIENT EDUCATION:  Education details: Pt education about findings, rehab process, significance of fall prevention and use of FWW  and anatomy.  Person educated: Patient Education method: Medical illustrator Education comprehension: verbalized understanding   HOME EXERCISE PROGRAM: Access Code: [email or web address] URL: [email or web address] Date: 12/14/2022 Prepared by: Consuela Mimes  Exercises - Sit to Stand  - 1 x daily - 7 x weekly - 2 sets - 10 reps - Supine Bridge with Resistance Band  - 1 x daily - 7 x weekly - 2 sets - 10 reps - Sidelying Hip Abduction  - 1 x daily - 7 x weekly - 2 sets - 10 reps - Standing 3-Way Kick  - 1 x daily - 7 x weekly - 2 sets - 10 reps     ASSESSMENT:   CLINICAL IMPRESSION:  Patent had notable increase in pain and onset of muscle spasm affecting L lateral hip and L lateral thigh that has responded well with medical management/new Rx. He does report onset of spasm following completion of sidelying hip abduction drill. Pt exhibits no restrictions with L hip ROM (within limits of posterior hip precautions). Pt is still limited with tolerance of weightbearing activity. Patient will benefit from continued skilled therapeutic intervention to address the above deficits as needed for improved function and QoL.      OBJECTIVE IMPAIRMENTS: Abnormal gait, decreased activity tolerance, decreased balance, decreased endurance, decreased knowledge of condition,  decreased knowledge of use of DME, decreased mobility, difficulty walking, decreased ROM, decreased strength, and dizziness.    ACTIVITY LIMITATIONS: carrying, lifting, bending, sitting, standing, squatting, sleeping, stairs, transfers, dressing, and locomotion level   PARTICIPATION LIMITATIONS: cleaning, shopping, community activity, occupation, and yard work   PERSONAL FACTORS:  Pt is impulsive and eager to get better so that he can return to work.   are also affecting patient's functional outcome.    REHAB POTENTIAL: Good   CLINICAL DECISION MAKING: Stable/uncomplicated   EVALUATION COMPLEXITY: Low     GOALS: Goals reviewed with patient? Yes   SHORT TERM GOALS: Target date: 12/22/2022 Pt will improve L hip ROM to 10 to 90 degrees without pain in order to promote normal gait and stability.  Baseline: 20 degree Hip felx and 0 deg Ext 12/04/22: 90 deg hip flex and 5 deg EXT 12/18/22: 90 deg hip flex and 10 deg hip EXT Goal status: ACHIEVED    2.  Pt will be able to ambulate 551ft safely with Cane without fall and pain . Baseline: 30 ft with SBA and heavy reliance on [facility]   12/04/22: Pt uses SPC for primary means of mobility in home and community at this time.  Goal status: ACHIEVED       LONG TERM GOALS: Target date: 01/26/2023   Pt will Improve TUG time to <10secs without SPC to demonstrate decreased fall risk.  Baseline: 19 secs with SPC 12/04/22: 14.6 seconds with SPC 12/18/22: 9.5 sec Goal status: ACHIEVED   2.  Pt will Improve LEFS score to >65/80 to demonstrate improved QoL Baseline: 18/80 12/04/22: 20/80  12/18/22: 22/80 Goal status: NOT MET    3.  Pt will be able to ambulate Independently  with LRAD without LOB and pain in order to return to community level mobility safely  Baseline: With [facility] and FWW.   12/04/22: ModI ambulation with SPC/LRAD with mild pain at longer distances, no LOB 12/18/22: Amb IND with SPC with no LOB Goal status: ACHIEVED    4.  Pt will score >50 on  FOTO Baseline: 36/56 12/04/22: 35/56 12/18/22: 37/56 Goal status: NOT MET    5.  Pt will demonstrate 4/5 L hip strength without pain to improve stability and safety with all functional mobility in order to return to work.  Baseline: 2/5 grossly    12/04/22: 4-/5 grossly 12/18/22: 4-/5 grossly Goal status: IN PROGRESS          PLAN:   PT FREQUENCY: 2x/week   PT DURATION: 4 weeks   PLANNED INTERVENTIONS: Therapeutic exercises, Therapeutic activity, Neuromuscular re-education, Balance training, Gait training, Patient/Family education, Self Care, and Joint mobilization   PLAN FOR NEXT SESSION: Progressive gait and stepping activity with gradual AD wean, weight shifting work to improve standing tolerance on surgical LE, progressive LE strengthening. Manual therapy as needed for anterior hip and/or posterolateral hip pain.     Consuela Mimes, PT, DPT [phone]  Physical Therapist- [facility]  01/09/2023, 8:17 PM

## 2023-01-09 ENCOUNTER — Other Ambulatory Visit: Payer: Self-pay

## 2023-01-10 ENCOUNTER — Ambulatory Visit (INDEPENDENT_AMBULATORY_CARE_PROVIDER_SITE_OTHER): Payer: Self-pay | Admitting: Physician Assistant

## 2023-01-10 ENCOUNTER — Encounter: Payer: Self-pay | Admitting: Physician Assistant

## 2023-01-10 ENCOUNTER — Ambulatory Visit: Payer: Worker's Compensation | Admitting: Physical Therapy

## 2023-01-10 VITALS — BP 187/103 | HR 72

## 2023-01-10 VITALS — BP 192/110 | HR 77 | Temp 98.1°F | Ht 72.0 in | Wt 202.0 lb

## 2023-01-10 DIAGNOSIS — I517 Cardiomegaly: Secondary | ICD-10-CM | POA: Insufficient documentation

## 2023-01-10 DIAGNOSIS — M899 Disorder of bone, unspecified: Secondary | ICD-10-CM

## 2023-01-10 DIAGNOSIS — I10 Essential (primary) hypertension: Secondary | ICD-10-CM

## 2023-01-10 DIAGNOSIS — R29898 Other symptoms and signs involving the musculoskeletal system: Secondary | ICD-10-CM

## 2023-01-10 DIAGNOSIS — E7800 Pure hypercholesterolemia, unspecified: Secondary | ICD-10-CM

## 2023-01-10 DIAGNOSIS — R7303 Prediabetes: Secondary | ICD-10-CM | POA: Insufficient documentation

## 2023-01-10 DIAGNOSIS — D649 Anemia, unspecified: Secondary | ICD-10-CM

## 2023-01-10 DIAGNOSIS — E785 Hyperlipidemia, unspecified: Secondary | ICD-10-CM | POA: Insufficient documentation

## 2023-01-10 DIAGNOSIS — R262 Difficulty in walking, not elsewhere classified: Secondary | ICD-10-CM

## 2023-01-10 DIAGNOSIS — R2689 Other abnormalities of gait and mobility: Secondary | ICD-10-CM

## 2023-01-10 DIAGNOSIS — Z96642 Presence of left artificial hip joint: Secondary | ICD-10-CM

## 2023-01-10 DIAGNOSIS — I160 Hypertensive urgency: Secondary | ICD-10-CM

## 2023-01-10 MED ORDER — ATORVASTATIN CALCIUM 10 MG PO TABS: 10.0000 mg | ORAL_TABLET | Freq: Every day | ORAL | 3 refills | Status: DC

## 2023-01-10 MED ORDER — LISINOPRIL 10 MG PO TABS: 10.0000 mg | ORAL_TABLET | Freq: Every day | ORAL | 3 refills | Status: DC

## 2023-01-10 NOTE — Patient Instructions (Signed)
Reviewed the "Seven S's" of hypertension including salt, smoking, stimulants (e.g. caffeine), stress, sleep, snoring (OSA), sedentary lifestyle.  

## 2023-01-10 NOTE — Progress Notes (Signed)
Date:  01/10/2023   Name:  Christian Hughes   DOB:  11/30/1960   MRN:  [id number]   Chief Complaint: Establish Care and Hypertension (Was taking blood pressure medication lisinopril 10 mg, by previous provider )  HPI Vong is a pleasant 63 year old male new to the practice today to establish care and restart medication for hypertension.  He tells me he was on lisinopril 10 mg given by previous provider Dr. Arlana Pouch, with good control of blood pressure on the medication.  He stopped medication 5 years ago and was not monitoring blood pressure.  3 months ago he had traumatic fall at work with left hip fracture requiring hospitalization 10/06/22-10/10/22 and left hip hemiarthroplasty 10/07/22. Many labs were done during his admission, which I have reviewed.  He was found to have prediabetes A1c 6.0% and HLD with LDL 152. Hypertension of 170's-190's systolic was managed with amlodipine during admission but it caused orthostasis so was stopped. He was supposed to f/u with PCP in 2wk, but could not see Dr. Arlana Pouch due to being considered new patient after so many years without being seen, and Dr. Arlana Pouch is not taking new patients.   He had f/u with surgeon Dr. Martha Clan 10/23/22, 11/22/22, and 01/01/23 with good postoperative progress, in PT for which he has a session this afternoon.   Secondary complaint of low libido and erectile dysfunction. Will address at future visit.    Medication list has been reviewed and updated.  Current Meds  Medication Sig   atorvastatin (LIPITOR) 10 MG tablet Take 1 tablet (10 mg total) by mouth daily.   lisinopril (ZESTRIL) 10 MG tablet Take 1 tablet (10 mg total) by mouth daily.     Review of Systems  Constitutional:  Negative for fatigue and fever.  Respiratory:  Negative for chest tightness and shortness of breath.   Cardiovascular:  Negative for chest pain and palpitations.  Gastrointestinal:  Negative for abdominal pain.  Musculoskeletal:  Positive for arthralgias (left  hip).    Patient Active Problem List   Diagnosis Date Noted   Hyperlipidemia 01/10/2023   Prediabetes 01/10/2023   Primary hypertension 01/10/2023   Abnormal EKG 10/06/2022   Bone lesion_sclerotic lesions in the right iliac bone 10/06/2022    No Known Allergies   There is no immunization history on file for this patient.  Past Surgical History:  Procedure Laterality Date   HIP ARTHROPLASTY Left 10/07/2022   Procedure: ARTHROPLASTY BIPOLAR HIP (HEMIARTHROPLASTY);  Surgeon: Juanell Fairly, MD;  Location: [facility] ORS;  Service: Orthopedics;  Laterality: Left;   JOINT REPLACEMENT  10/06/2022   Left Hip Replacement due fall while at work   Ventral hernia repair      Social History   Tobacco Use   Smoking status: Never   Smokeless tobacco: Never   Tobacco comments:    Occasional vaping  Vaping Use   Vaping Use: Never used  Substance Use Topics   Alcohol use: Not Currently    Comment: Very seldom, special occasions.   Drug use: Not Currently    Types: Marijuana    History reviewed. No pertinent family history.      01/10/2023    2:06 PM  GAD 7 : Generalized Anxiety Score  Nervous, Anxious, on Edge 0  Control/stop worrying 0  Worry too much - different things 0  Trouble relaxing 0  Restless 0  Easily annoyed or irritable 0  Afraid - awful might happen 0  Total GAD 7 Score 0  Anxiety  Difficulty Not difficult at all       01/10/2023    2:06 PM  Depression screen PHQ 2/9  Decreased Interest 0  Down, Depressed, Hopeless 0  PHQ - 2 Score 0  Altered sleeping 0  Tired, decreased energy 0  Change in appetite 0  Feeling bad or failure about yourself  0  Trouble concentrating 0  Moving slowly or fidgety/restless 0  Suicidal thoughts 0  PHQ-9 Score 0  Difficult doing work/chores Not difficult at all    BP Readings from Last 3 Encounters:  01/10/23 (!) 192/110  12/11/22 (!) 148/97  10/10/22 (!) 141/89    Wt Readings from Last 3 Encounters:  01/10/23 202 lb  (91.6 kg)  10/06/22 220 lb (99.8 kg)    BP (!) 192/110 (BP Location: Left Arm, Patient Position: Sitting, Cuff Size: Large)   Pulse 77   Temp 98.1 F (36.7 C) (Oral)   Ht 6' (1.829 m)   Wt 202 lb (91.6 kg)   SpO2 97%   BMI 27.40 kg/m   Physical Exam Vitals and nursing note reviewed.  Constitutional:      Appearance: Normal appearance.     Comments: Using a cane  Neck:     Vascular: No carotid bruit.  Cardiovascular:     Rate and Rhythm: Normal rate and regular rhythm.     Heart sounds: No murmur heard.    No friction rub. No gallop.  Pulmonary:     Effort: Pulmonary effort is normal.     Breath sounds: Normal breath sounds.  Abdominal:     General: There is no distension.  Musculoskeletal:        General: Normal range of motion.  Skin:    General: Skin is warm and dry.  Neurological:     Mental Status: He is alert and oriented to person, place, and time.     Gait: Gait is intact.  Psychiatric:        Mood and Affect: Mood and affect normal.     Recent Labs     Component Value Date/Time   NA 134 (L) 10/10/2022 0415   K 4.1 10/10/2022 0415   CL 102 10/10/2022 0415   CO2 26 10/10/2022 0415   GLUCOSE 105 (H) 10/10/2022 0415   BUN 17 10/10/2022 0415   CREATININE 0.85 10/10/2022 0415   CALCIUM 8.5 (L) 10/10/2022 0415   PROT 6.9 10/06/2022 1254   ALBUMIN 3.6 10/06/2022 1254   AST 22 10/06/2022 1254   ALT 20 10/06/2022 1254   ALKPHOS 80 10/06/2022 1254   BILITOT 0.5 10/06/2022 1254   GFRNONAA >60 10/10/2022 0415    Lab Results  Component Value Date   WBC 11.7 (H) 10/10/2022   HGB 9.9 (L) 10/10/2022   HCT 30.8 (L) 10/10/2022   MCV 89.0 10/10/2022   PLT 278 10/10/2022   Lab Results  Component Value Date   HGBA1C 6.0 (H) 10/06/2022   Lab Results  Component Value Date   CHOL 223 (H) 10/06/2022   HDL 43 10/06/2022   LDLCALC 152 (H) 10/06/2022   TRIG 140 10/06/2022   CHOLHDL 5.2 10/06/2022   No results found for: "TSH"   Assessment and Plan:  1.  Primary hypertension -Restart lisinopril 10 mg daily as below.  Discussed with patient I anticipate he will need stronger dose moving forward but we will see how it goes at this dose.  Emphasized importance of home blood pressure monitoring, patient has a device at home and agrees  to keep a log for my review next time.  Close follow-up in 2 weeks.   -Obtain repeat CBC and CMP.  Patient is worried he may not make it to his PT appointment today, so he will return at his convenience to have these labs drawn. - lisinopril (ZESTRIL) 10 MG tablet; Take 1 tablet (10 mg total) by mouth daily.  Dispense: 30 tablet; Refill: 3 - Comprehensive metabolic panel - CBC with Differential/Platelet  2. Asymptomatic hypertensive urgency Plan as above, take first dose of medication as soon as obtained.  Patient to let me know if blood pressure remains in hypertensive urgency range, which was reviewed with him.  3. Anemia, unspecified type Repeat CBC when able, possibly related to blood loss alone, though anemia was noted on his initial labs when presented to ED suggesting possible chronic etiology.  CBC particularly important in the context of incidental sclerotic lesions of right ilium  - CBC with Differential/Platelet  4. Hypocalcemia Repeat CMP  5. Pure hypercholesterolemia The 10-year ASCVD risk score (Arnett DK, et al., 2019) is: 27.7%   LDL greater than 150 in the context of like hypertension warrants initiation of statin therapy.  Begin atorvastatin 10 mg daily as below.  Patient advised to delay initiation of this medication by 1 week after starting his lisinopril to avoid side effect confusion  - atorvastatin (LIPITOR) 10 MG tablet; Take 1 tablet (10 mg total) by mouth daily.  Dispense: 30 tablet; Refill: 3  6. Prediabetes Last A1c 6.0%, no treatment needed at this time.  Prioritize blood pressure control and lifestyle changes which were briefly reviewed with the patient to include diet low in simple  carbohydrates such as white starches (bread, pasta, rice) and refined sugar found in desserts and sweetened beverages including juice, sweet tea, and soda.  Routine physical activity as tolerated given current state of left hip.  7. Bone lesion_sclerotic lesions in the right iliac bone Reviewed x-rays of pelvis on 10/06/2022 and 10/07/2022.  Several round sclerotic lesions noted.  Report reads "Two round sclerotic lesions in the right iliac bone are nonspecific. While these could represent bone islands, metastatic disease is not excluded. Correlate for history of malignancy."  Plan for repeat x-ray of pelvis next visit to assess for interval changes.  Will try to obtain records to see when last colonoscopy was done, may be due for CRC screen though this will come at a cost...    Return in about 2 weeks (around 01/24/2023) for OV f/u HTN.   Partially dictated using Animal nutritionist. Any errors are unintentional.  Alvester Morin, PA-C, DMSc, Nutritionist [facility] Primary Care and Sports Medicine [facility] [phone]

## 2023-01-10 NOTE — Therapy (Signed)
OUTPATIENT PHYSICAL THERAPY NOTE    Patient Name: Christian Hughes MRN: [id number] DOB:31-Jul-1961, 62 y.o., male Today's Date: 01/10/2023  END OF SESSION:   PT End of Session - 01/10/23 1524     Visit Number 17    Number of Visits 20    Date for PT Re-Evaluation 01/25/23    Authorization Type Workers Compensation    Authorization Time Period 12/18/22-02/22/23    Progress Note Due on Visit 20    PT Start Time 1502    PT Stop Time 1516    PT Time Calculation (min) 14 min    Activity Tolerance Patient tolerated treatment well;Patient limited by pain    Behavior During Therapy [facility] for tasks assessed/performed;Impulsive             Past Medical History:  Diagnosis Date   Engages in vaping    H/O ventral hernia    S/p of repair   Hypertension Early 2010's   Marijuana use    Orthostatic dizziness 10/08/2022   Past Surgical History:  Procedure Laterality Date   HIP ARTHROPLASTY Left 10/07/2022   Procedure: ARTHROPLASTY BIPOLAR HIP (HEMIARTHROPLASTY);  Surgeon: Juanell Fairly, MD;  Location: [facility] ORS;  Service: Orthopedics;  Laterality: Left;   JOINT REPLACEMENT  10/06/2022   Left Hip Replacement due fall while at work   Ventral hernia repair     Patient Active Problem List   Diagnosis Date Noted   Hyperlipidemia 01/10/2023   Prediabetes 01/10/2023   Primary hypertension 01/10/2023   Left ventricular hypertrophy 01/10/2023   Abnormal EKG 10/06/2022   Bone lesion_sclerotic lesions in the right iliac bone 10/06/2022    PCP: Dewaine Oats MD  REFERRING PROVIDER: Juanell Fairly MD   REFERRING DIAG: [phone] (ICD-10-CM) - Presence of left artificial hip joint   THERAPY DIAG:  History of left hip hemiarthroplasty  Weakness of left leg  Balance problems  Difficulty in walking, not elsewhere classified  Rationale for Evaluation and Treatment Rehabilitation  PERTINENT HISTORY: From initial eval: Christian Hughes is a 62 y.o. male without significant past medical history  except for occasional vaping amd marijuana use, who presents with fall and left hip pain.  Pt states that he slipped and fell at work at about 11:00 AM.  Denies loss of consciousness.  He injured his left hip, causing left hip pain, which is constant, sharp, severe, nonradiating, aggravated by movement.  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    "My mom took care of me after my hip surgery. I had therapy at home. I returned to my home alone after my first follow up appointment. I have pain in my buttocks, groin and the corner of my buttocks of about 2 to 3/10. I have high pain tolerance. I am unable to walk normal and I feel weak. I began to use this cane on my own after my PT at home stopped.  I can't wait to go to work because, I love to work."   PAIN:  Are you having pain? Yes: Pain location: 2-3/10 Pain description: stabbing Aggravating factors: Moving, walking, WB   Relieving factors: rest, OTC meds, ice    WEIGHT BEARING RESTRICTIONS: Yes:  WBAT   FALLS:  Has patient fallen in last 6 months? Yes. 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 Pt negotiates some small staircases in town. Pt has historically taken 24-packs of water bottles to his work for customers.    PLOF: Independent   PATIENT GOALS: "I want to return to work without hurting and falling as soon as I can."   NEXT MD VISIT: 11/22/2022    PRECAUTIONS: Posterior hip precautions   L hip hemiarthroplasty following L femoral neck fracture (DOS:  10/07/22)    SUBJECTIVE:                                                                                                                                                                                      SUBJECTIVE STATEMENT: Pt reports having high BP at doctor's office prior to attending PT today. He has note documenting blood pressures prior to leaving office. Previous measures were 172/116 and 192/110. He states that his physician told him to inform PT about high blood pressure. Pt denies visual changes, dizziness, or headache.    PAIN:  Are you having pain? 2-3/10 pain in groin, spasm intermittently affecting L lateral thigh Pain location: L groin, L posterolateral hip   OBJECTIVE: (objective measures completed at initial evaluation unless otherwise dated)   PATIENT SURVEYS:  LEFS 18/80 FOTO 36   COGNITION: Overall cognitive status: Within functional limits for tasks assessed and impulsive.                            SENSATION: WFL   MUSCLE LENGTH: deferred  test 2/2 to pain.    POSTURE: rounded shoulders   PALPATION: Pain in L sacral border, Iliac crest, gluteus medius, TFL, and greater trochanter     LOWER EXTREMITY ROM:    Active ROM Right eval Left eval  Hip flexion WFL 20 deg with pain  Hip extension [facility]    Hip abduction WFL 20 with pain   Hip adduction [facility]    Hip internal rotation [facility]    Hip external rotation [facility]    Knee flexion [facility]  Knee extension [facility]  Ankle dorsiflexion [facility]  Ankle plantarflexion [facility]  Ankle inversion [facility]  Ankle eversion WFL WFL   (Blank rows = not tested)   LOWER EXTREMITY MMT:   MMT Right eval Left eval Left 12/04/22 Left 12/18/22  Hip flexion WFL 2+ 4* 4-  Hip  extension [facility] 2 3+   Hip abduction [facility] 2 4-   Hip adduction [facility]      Hip internal rotation [facility]      Hip external rotation [facility]      Knee flexion WFL 3- 3+ 4-  Knee extension WFl 3 4+ 4*  Ankle dorsiflexion WFL 3+    Ankle plantarflexion WFL 4     Ankle inversion WFL 4    Ankle eversion WFL 3-     (Blank rows = not tested) Pt's L ankle remains inverted 10 degrees since the Surgery.     FUNCTIONAL TESTS (INITIAL EVALUATION) 30 seconds chair stand test: 5 reps completed Timed up and go (TUG): 19secs 10 meter walk test: 12secs= 1,2 m/secs             Balance Test : SL standing unable, Modified tandem 10 secs, Tandem Unable, Rhomberg with EO 5 secs and unable with EC.  GAIT: Distance walked: 30 ft  Assistive device utilized: Single point cane Level of assistance: SBA Comments: Pt unsteady on feet with severe antalgic gait, decreased loading response, absent heel strike and absent toe off gait with uneven stride and decreased speed. Heavy reliance on the [facility]       TODAY'S TREATMENT:                                                                                                                              DATE: 01/10/2023    Today's Vitals   01/10/23 1506 01/10/23 1509  BP: (!) 197/110 (!) 187/103  Pulse: 72 72  SpO2: 97%    There is no height or weight on file to calculate BMI.    Monitored blood pressure/vitals today and gathered subjective information regarding current symptoms. Patient was educated on risks of ongoing high resting blood pressure and contraindication of exercise given values seen in MD's office and in office here today. We instructed patient on strategies for avoiding increase in blood pressure (opting for low-sodium options, avoiding soft drinks as recommended by MD, avoiding "convenience foods") and held on exercise performance today. Pt was planning to head to pharmacy to obtain new Rx for blood pressure management immediately after leaving clinic.        PATIENT EDUCATION:  Education details: see above for patient education details Person educated: Patient Education method: Explanation and Demonstration Education comprehension: verbalized understanding   HOME EXERCISE PROGRAM: Access Code:  [email or web address] URL: [email or web address] Date: 12/14/2022 Prepared by: Consuela Mimes  Exercises - Sit to Stand  - 1 x daily - 7 x weekly - 2 sets - 10 reps - Supine Bridge with Resistance Band  - 1 x daily - 7 x weekly - 2 sets - 10 reps - Sidelying Hip Abduction  - 1 x daily - 7 x weekly - 2 sets - 10 reps - Standing 3-Way Kick  - 1 x daily - 7 x weekly - 2 sets - 10 reps  ASSESSMENT:   CLINICAL IMPRESSION:  Patent had documentation for high blood pressure from his doctor's office contraindicating exercise. Patient had no significant S&S today, but his blood pressure remains high (see values above) with values obtained in office today. We held on PT to prioritize patient's safety and health risk associated with hypertensive urgency. Pt is to start on new Rx for BP management. We instructed on holding exercise at this time and discussed strategies for keeping BP low. Pending blood pressure management to safe levels, patient will benefit from continued skilled therapeutic intervention to address the above deficits as needed for improved function and QoL.      OBJECTIVE IMPAIRMENTS: Abnormal gait, decreased activity tolerance, decreased balance, decreased endurance, decreased knowledge of condition, decreased knowledge of use of DME, decreased mobility, difficulty walking, decreased ROM, decreased strength, and dizziness.    ACTIVITY LIMITATIONS: carrying, lifting, bending, sitting, standing, squatting, sleeping, stairs, transfers, dressing, and locomotion level   PARTICIPATION LIMITATIONS: cleaning, shopping, community activity, occupation, and yard work   PERSONAL FACTORS:  Pt is impulsive and eager to get better so that he can return to work.   are also affecting patient's functional outcome.    REHAB POTENTIAL: Good   CLINICAL DECISION MAKING: Stable/uncomplicated   EVALUATION COMPLEXITY: Low     GOALS: Goals reviewed with patient? Yes   SHORT TERM GOALS: Target  date: 12/22/2022 Pt will improve L hip ROM to 10 to 90 degrees without pain in order to promote normal gait and stability.  Baseline: 20 degree Hip felx and 0 deg Ext 12/04/22: 90 deg hip flex and 5 deg EXT 12/18/22: 90 deg hip flex and 10 deg hip EXT Goal status: ACHIEVED    2.  Pt will be able to ambulate 556ft safely with Cane without fall and pain . Baseline: 30 ft with SBA and heavy reliance on [facility].   12/04/22: Pt uses SPC for primary means of mobility in home and community at this time.  Goal status: ACHIEVED       LONG TERM GOALS: Target date: 01/26/2023   Pt will Improve TUG time to <10secs without SPC to demonstrate decreased fall risk.  Baseline: 19 secs with SPC 12/04/22: 14.6 seconds with SPC 12/18/22: 9.5 sec Goal status: ACHIEVED   2.  Pt will Improve LEFS score to >65/80 to demonstrate improved QoL Baseline: 18/80 12/04/22: 20/80  12/18/22: 22/80 Goal status: NOT MET    3.  Pt will be able to ambulate Independently  with LRAD without LOB and pain in order to return to community level mobility safely  Baseline: With [facility] and [facility].   12/04/22: ModI ambulation with SPC/LRAD with mild pain at longer distances, no LOB 12/18/22: Amb IND with SPC with no LOB Goal status: ACHIEVED    4.  Pt will score >50 on FOTO Baseline: 36/56 12/04/22: 35/56 12/18/22: 37/56 Goal status: NOT MET    5.  Pt will demonstrate 4/5 L hip strength without pain to improve stability and safety with all functional mobility in order to return to work.  Baseline: 2/5 grossly    12/04/22: 4-/5 grossly 12/18/22: 4-/5 grossly Goal status: IN PROGRESS          PLAN:   PT FREQUENCY: 2x/week   PT DURATION: 4 weeks   PLANNED INTERVENTIONS: Therapeutic exercises, Therapeutic activity, Neuromuscular re-education, Balance training, Gait training, Patient/Family education, Self Care, and Joint mobilization   PLAN FOR NEXT SESSION: Resume PT pending safe reduction of resting blood pressure.  Progressive gait  and  stepping activity with gradual AD wean, weight shifting work to improve standing tolerance on surgical LE, progressive LE strengthening. Manual therapy as needed for anterior hip and/or posterolateral hip pain.     Consuela Mimes, PT, DPT [phone]  Physical Therapist- [facility]  01/10/2023, 3:31 PM

## 2023-01-11 ENCOUNTER — Other Ambulatory Visit: Payer: Self-pay

## 2023-01-11 DIAGNOSIS — R29898 Other symptoms and signs involving the musculoskeletal system: Secondary | ICD-10-CM | POA: Diagnosis present

## 2023-01-11 DIAGNOSIS — D649 Anemia, unspecified: Secondary | ICD-10-CM

## 2023-01-11 DIAGNOSIS — R2689 Other abnormalities of gait and mobility: Secondary | ICD-10-CM | POA: Diagnosis present

## 2023-01-11 DIAGNOSIS — Z96642 Presence of left artificial hip joint: Secondary | ICD-10-CM | POA: Diagnosis present

## 2023-01-11 DIAGNOSIS — R262 Difficulty in walking, not elsewhere classified: Secondary | ICD-10-CM | POA: Diagnosis present

## 2023-01-11 DIAGNOSIS — I10 Essential (primary) hypertension: Secondary | ICD-10-CM

## 2023-01-11 NOTE — Progress Notes (Signed)
Reprint labs d/t ss wrong

## 2023-01-12 LAB — CBC WITH DIFFERENTIAL/PLATELET
Basophils Absolute: 0 10*3/uL (ref 0.0–0.2)
Basos: 0 %
EOS (ABSOLUTE): 0.2 10*3/uL (ref 0.0–0.4)
Eos: 3 %
Hematocrit: 36 % — ABNORMAL LOW (ref 37.5–51.0)
Hemoglobin: 11.6 g/dL — ABNORMAL LOW (ref 13.0–17.7)
Immature Grans (Abs): 0 10*3/uL (ref 0.0–0.1)
Immature Granulocytes: 0 %
Lymphocytes Absolute: 2 10*3/uL (ref 0.7–3.1)
Lymphs: 23 %
MCH: 27.8 pg (ref 26.6–33.0)
MCHC: 32.2 g/dL (ref 31.5–35.7)
MCV: 86 fL (ref 79–97)
Monocytes Absolute: 0.8 10*3/uL (ref 0.1–0.9)
Monocytes: 9 %
Neutrophils Absolute: 5.7 10*3/uL (ref 1.4–7.0)
Neutrophils: 65 %
Platelets: 333 10*3/uL (ref 150–450)
RBC: 4.18 x10E6/uL (ref 4.14–5.80)
RDW: 13.8 % (ref 11.6–15.4)
WBC: 8.8 10*3/uL (ref 3.4–10.8)

## 2023-01-12 LAB — COMPREHENSIVE METABOLIC PANEL
ALT: 11 IU/L (ref 0–44)
AST: 16 IU/L (ref 0–40)
Albumin/Globulin Ratio: 1.6
Albumin: 4.1 g/dL (ref 3.9–4.9)
Alkaline Phosphatase: 115 IU/L (ref 44–121)
BUN/Creatinine Ratio: 14 (ref 10–24)
BUN: 13 mg/dL (ref 8–27)
Bilirubin Total: <0.2 mg/dL (ref 0.0–1.2)
CO2: 23 mmol/L (ref 20–29)
Calcium: 9.7 mg/dL (ref 8.6–10.2)
Chloride: 105 mmol/L (ref 96–106)
Creatinine, Ser: 0.94 mg/dL (ref 0.76–1.27)
Globulin, Total: 2.6 g/dL (ref 1.5–4.5)
Glucose: 100 mg/dL — ABNORMAL HIGH (ref 70–99)
Potassium: 4.4 mmol/L (ref 3.5–5.2)
Sodium: 141 mmol/L (ref 134–144)
Total Protein: 6.7 g/dL (ref 6.0–8.5)
eGFR: 91 mL/min/{1.73_m2} (ref >59)

## 2023-01-12 NOTE — Progress Notes (Signed)
Lab added D50.9 

## 2023-01-13 LAB — IRON AND TIBC
Iron Saturation: 8 % — CL (ref 15–55)
Iron: 24 ug/dL — ABNORMAL LOW (ref 38–169)
Total Iron Binding Capacity: 301 ug/dL (ref 250–450)
UIBC: 277 ug/dL (ref 111–343)

## 2023-01-13 LAB — VITAMIN B12: Vitamin B-12: 413 pg/mL (ref 232–1245)

## 2023-01-13 LAB — FOLATE: Folate: 4.8 ng/mL (ref >3.0)

## 2023-01-13 LAB — SPECIMEN STATUS REPORT

## 2023-01-13 LAB — FERRITIN: Ferritin: 29 ng/mL — ABNORMAL LOW (ref 30–400)

## 2023-01-15 ENCOUNTER — Ambulatory Visit: Payer: Worker's Compensation | Admitting: Physical Therapy

## 2023-01-15 ENCOUNTER — Encounter: Payer: Self-pay | Admitting: Physical Therapy

## 2023-01-15 VITALS — BP 158/96 | HR 65

## 2023-01-15 DIAGNOSIS — R262 Difficulty in walking, not elsewhere classified: Secondary | ICD-10-CM

## 2023-01-15 DIAGNOSIS — R29898 Other symptoms and signs involving the musculoskeletal system: Secondary | ICD-10-CM

## 2023-01-15 DIAGNOSIS — Z96642 Presence of left artificial hip joint: Secondary | ICD-10-CM

## 2023-01-15 DIAGNOSIS — R2689 Other abnormalities of gait and mobility: Secondary | ICD-10-CM

## 2023-01-15 NOTE — Therapy (Unsigned)
OUTPATIENT PHYSICAL THERAPY TREATMENT    Patient Name: Christian Hughes MRN: [id number] DOB:[birth year], 62 y.o., male Today's Date: 01/15/2023  END OF SESSION:   PT End of Session - 01/17/23 0721     Visit Number 18    Number of Visits 20    Date for PT Re-Evaluation 01/25/23    Authorization Type Workers Compensation    Authorization Time Period 12/18/22-02/22/23    Progress Note Due on Visit 20    PT Start Time 1458    PT Stop Time 1545    PT Time Calculation (min) 47 min    Activity Tolerance Patient tolerated treatment well;Patient limited by pain    Behavior During Therapy [facility] for tasks assessed/performed;Impulsive             Past Medical History:  Diagnosis Date   Engages in vaping    H/O ventral hernia    S/p of repair   Hypertension Early 2010's   Marijuana use    Orthostatic dizziness 10/08/2022   Past Surgical History:  Procedure Laterality Date   HIP ARTHROPLASTY Left 10/07/2022   Procedure: ARTHROPLASTY BIPOLAR HIP (HEMIARTHROPLASTY);  Surgeon: Juanell Fairly, MD;  Location: [facility] ORS;  Service: Orthopedics;  Laterality: Left;   JOINT REPLACEMENT  10/06/2022   Left Hip Replacement due fall while at work   Ventral hernia repair     Patient Active Problem List   Diagnosis Date Noted   Hyperlipidemia 01/10/2023   Prediabetes 01/10/2023   Primary hypertension 01/10/2023   Left ventricular hypertrophy 01/10/2023   Abnormal EKG 10/06/2022   Bone lesion_sclerotic lesions in the right iliac bone 10/06/2022    PCP: Dewaine Oats MD  REFERRING PROVIDER: Juanell Fairly MD   REFERRING DIAG: [phone] (ICD-10-CM) - Presence of left artificial hip joint   THERAPY DIAG:  History of left hip hemiarthroplasty  Weakness of left leg  Balance problems  Difficulty in walking, not elsewhere classified  Rationale for Evaluation and Treatment Rehabilitation  PERTINENT HISTORY: From initial eval: Christian Hughes is a 62 y.o. male without significant past medical  history except for occasional vaping amd marijuana use, who presents with fall and left hip pain.  Pt states that he slipped and fell at work at about 11:00 AM.  Denies loss of consciousness.  He injured his left hip, causing left hip pain, which is constant, sharp, severe, nonradiating, aggravated by movement.  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    "My mom took care of me after my hip surgery. I had therapy at home. I returned to my home alone after my first follow up appointment. I have pain in my buttocks, groin and the corner of my buttocks of about 2 to 3/10. I have high pain tolerance. I am unable to walk normal and I feel weak. I began to use this cane on my own after my PT at home stopped.  I can't wait to go to work because, I love to work."   PAIN:  Are you having pain? Yes: Pain location: 2-3/10 Pain description: stabbing Aggravating factors: Moving, walking, WB   Relieving factors: rest, OTC meds, ice    WEIGHT BEARING RESTRICTIONS: Yes:  WBAT   FALLS:  Has patient fallen in last 6 months? Yes. Number of falls once once 3 months ago   LIVING ENVIRONMENT: Lives with: lives alone Lives in: Mobile home Stairs: Yes:  External: 4 steps; can reach both Has following equipment at home: Single point cane   OCCUPATION: Works in the office for [employer]. 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 Pt negotiates some small staircases in town. Pt has historically taken 24-packs of water bottles to his work for customers.    PLOF: Independent   PATIENT GOALS: "I want to return to work without hurting and falling as soon as I can."   NEXT MD VISIT: 11/22/2022    PRECAUTIONS: Posterior hip precautions   L hip hemiarthroplasty following L femoral neck fracture (DOS:  10/07/22)    SUBJECTIVE:                                                                                                                                                                                      SUBJECTIVE STATEMENT: Pt started Lisinopril for his high BP. Pt has been taking it since he picked it up from pharmacy last Thursday. Patient reports intermittent HA. No visual changes. Patient reports his hip is doing well at this time; he is compliant with his HEP.    PAIN:  Are you having pain? 2-3/10 pain in groin, spasm intermittently affecting L lateral thigh Pain location: L groin, L posterolateral hip    OBJECTIVE: (objective measures completed at initial evaluation unless otherwise dated)   PATIENT SURVEYS:  LEFS 18/80 FOTO 36   COGNITION: Overall cognitive status: Within functional limits for tasks assessed and impulsive.                            SENSATION: WFL   MUSCLE LENGTH: deferred  test 2/2 to pain.    POSTURE: rounded shoulders   PALPATION: Pain in L sacral border, Iliac crest, gluteus medius, TFL, and greater trochanter     LOWER EXTREMITY ROM:    Active ROM Right eval Left eval  Hip flexion WFL 20 deg with pain  Hip extension [facility]    Hip abduction WFL 20 with pain   Hip adduction [facility]    Hip internal rotation [facility]    Hip external rotation [facility]    Knee flexion [facility]  Knee extension [facility]  Ankle dorsiflexion [facility]  Ankle plantarflexion [facility]  Ankle inversion [facility]  Ankle eversion WFL WFL   (Blank rows = not tested)   LOWER EXTREMITY MMT:   MMT Right eval Left eval Left 12/04/22 Left 12/18/22  Hip flexion WFL 2+ 4* 4-  Hip extension WFL 2 3+  Hip abduction [facility] 2 4-   Hip adduction [facility]      Hip internal rotation [facility]      Hip external rotation [facility]      Knee flexion WFL 3- 3+ 4-  Knee extension WFl 3 4+ 4*  Ankle dorsiflexion WFL 3+    Ankle plantarflexion WFL 4    Ankle inversion WFL 4    Ankle eversion WFL 3-     (Blank  rows = not tested) Pt's L ankle remains inverted 10 degrees since the Surgery.     FUNCTIONAL TESTS (INITIAL EVALUATION) 30 seconds chair stand test: 5 reps completed Timed up and go (TUG): 19secs 10 meter walk test: 12secs= 1,2 m/secs             Balance Test : SL standing unable, Modified tandem 10 secs, Tandem Unable, Rhomberg with EO 5 secs and unable with EC.  GAIT: Distance walked: 30 ft  Assistive device utilized: Single point cane Level of assistance: SBA Comments: Pt unsteady on feet with severe antalgic gait, decreased loading response, absent heel strike and absent toe off gait with uneven stride and decreased speed. Heavy reliance on the [facility]       TODAY'S TREATMENT:                                                                                                                              DATE: 01/17/2023   Vitals taken pre-exercise: Today's Vitals   01/15/23 1501 01/15/23 1506  BP: (!) 162/98 (!) 158/96  Pulse: 66 65   There is no height or weight on file to calculate BMI.    Therapeutic Exercise - for improved soft tissue flexibility and extensibility as needed for ROM, improved strength as needed to improve performance of CKC activities/functional movements  Nu-Step L3. Seat at 12 to maintain posterior hip flexion precautions. 5 min, L3 for warm up   BP post-exercise:153/91 mmHg    High knees 3x D/B length of // bars  Side steps on airex beam in // bars: x6 laps, SBA. VC's for mirror feedback to promote upright posture to reduce looking at feet in turn leading to anterior LOB. Intermittent need for UE support anteriorly due to heavy ankle/hip righting reactions.    Forward step up to Airex pad; 2x10 with either LE  Sit to stand: 2 x 6; from chair with 8-lb Goblet hold   Toe tapping; 2x10 alternating R/L with 12-inch step, no UE support.    *not today* Sidelying hip abduction; 3x8, no VC's for neutral trunk positioning today. Good carryover from  yesterday's session. Bridge 3x12:  Black Tband at distal femurs with good carryover in form/technique.    Cold pack (unbilled) - for anti-inflammatory and analgesic effect as needed for reduced pain and improved ability to participate in active PT intervention, along L hip in R sidelying with pillow between knees for comfort, x 5 minutes    PATIENT EDUCATION:  Education  details: Pt education about findings, rehab process, significance of fall prevention and use of FWW  and anatomy.  Person educated: Patient Education method: Medical illustrator Education comprehension: verbalized understanding   HOME EXERCISE PROGRAM: Access Code: [email or web address] URL: [email or web address] Date: 12/14/2022 Prepared by: Consuela Mimes  Exercises - Sit to Stand  - 1 x daily - 7 x weekly - 2 sets - 10 reps - Supine Bridge with Resistance Band  - 1 x daily - 7 x weekly - 2 sets - 10 reps - Sidelying Hip Abduction  - 1 x daily - 7 x weekly - 2 sets - 10 reps - Standing 3-Way Kick  - 1 x daily - 7 x weekly - 2 sets - 10 reps     ASSESSMENT:   CLINICAL IMPRESSION:  Patent fortunately has better BP than that noted at last visit. Diastolic blood pressure is still in 90s and just short of level that would contraindicate exercise. Pt is continuing with Lisinopril as prescribed by his PCP. BP was monitored through session today to ensure pt safety. He is still challenged with weight shifting drills requiring brief unipedal stance on operative lower limb as well as closed-chain exercises. Patient will benefit from continued skilled therapeutic intervention to address the above deficits as needed for improved function and QoL.      OBJECTIVE IMPAIRMENTS: Abnormal gait, decreased activity tolerance, decreased balance, decreased endurance, decreased knowledge of condition, decreased knowledge of use of DME, decreased mobility, difficulty walking, decreased ROM, decreased strength, and dizziness.     ACTIVITY LIMITATIONS: carrying, lifting, bending, sitting, standing, squatting, sleeping, stairs, transfers, dressing, and locomotion level   PARTICIPATION LIMITATIONS: cleaning, shopping, community activity, occupation, and yard work   PERSONAL FACTORS:  Pt is impulsive and eager to get better so that he can return to work.   are also affecting patient's functional outcome.    REHAB POTENTIAL: Good   CLINICAL DECISION MAKING: Stable/uncomplicated   EVALUATION COMPLEXITY: Low     GOALS: Goals reviewed with patient? Yes   SHORT TERM GOALS: Target date: 12/22/2022 Pt will improve L hip ROM to 10 to 90 degrees without pain in order to promote normal gait and stability.  Baseline: 20 degree Hip felx and 0 deg Ext 12/04/22: 90 deg hip flex and 5 deg EXT 12/18/22: 90 deg hip flex and 10 deg hip EXT Goal status: ACHIEVED    2.  Pt will be able to ambulate 544ft safely with Cane without fall and pain . Baseline: 30 ft with SBA and heavy reliance on [facility].   12/04/22: Pt uses SPC for primary means of mobility in home and community at this time.  Goal status: ACHIEVED       LONG TERM GOALS: Target date: 01/26/2023   Pt will Improve TUG time to <10secs without SPC to demonstrate decreased fall risk.  Baseline: 19 secs with SPC 12/04/22: 14.6 seconds with SPC 12/18/22: 9.5 sec Goal status: ACHIEVED   2.  Pt will Improve LEFS score to >65/80 to demonstrate improved QoL Baseline: 18/80 12/04/22: 20/80  12/18/22: 22/80 Goal status: NOT MET    3.  Pt will be able to ambulate Independently  with LRAD without LOB and pain in order to return to community level mobility safely  Baseline: With [facility] and FWW.   12/04/22: ModI ambulation with SPC/LRAD with mild pain at longer distances, no LOB 12/18/22: Amb IND with SPC with no LOB Goal status: ACHIEVED    4.  Pt will score >50  on FOTO Baseline: 36/56 12/04/22: 35/56 12/18/22: 37/56 Goal status: NOT MET    5.  Pt will demonstrate 4/5 L hip strength  without pain to improve stability and safety with all functional mobility in order to return to work.  Baseline: 2/5 grossly    12/04/22: 4-/5 grossly 12/18/22: 4-/5 grossly Goal status: IN PROGRESS          PLAN:   PT FREQUENCY: 2x/week   PT DURATION: 4 weeks   PLANNED INTERVENTIONS: Therapeutic exercises, Therapeutic activity, Neuromuscular re-education, Balance training, Gait training, Patient/Family education, Self Care, and Joint mobilization   PLAN FOR NEXT SESSION: Progressive gait and stepping activity with gradual AD wean, weight shifting work to improve standing tolerance on surgical LE, progressive LE strengthening. Manual therapy as needed for anterior hip and/or posterolateral hip pain.     Consuela Mimes, PT, DPT [phone]  Physical Therapist- [facility]  01/17/2023, 7:22 AM

## 2023-01-17 ENCOUNTER — Telehealth: Payer: Self-pay

## 2023-01-17 ENCOUNTER — Other Ambulatory Visit: Payer: Self-pay | Admitting: Physician Assistant

## 2023-01-17 ENCOUNTER — Other Ambulatory Visit: Payer: Self-pay

## 2023-01-17 ENCOUNTER — Ambulatory Visit: Payer: Worker's Compensation

## 2023-01-17 DIAGNOSIS — R262 Difficulty in walking, not elsewhere classified: Secondary | ICD-10-CM

## 2023-01-17 DIAGNOSIS — R29898 Other symptoms and signs involving the musculoskeletal system: Secondary | ICD-10-CM

## 2023-01-17 DIAGNOSIS — Z96642 Presence of left artificial hip joint: Secondary | ICD-10-CM

## 2023-01-17 DIAGNOSIS — R2689 Other abnormalities of gait and mobility: Secondary | ICD-10-CM

## 2023-01-17 DIAGNOSIS — I10 Essential (primary) hypertension: Secondary | ICD-10-CM

## 2023-01-17 MED ORDER — LISINOPRIL 20 MG PO TABS
20.0000 mg | ORAL_TABLET | Freq: Every day | ORAL | 1 refills | Status: DC
  Filled 2023-01-17: qty 30, 30d supply, fill #0

## 2023-01-17 NOTE — Progress Notes (Signed)
Alan from PT called to speak with me.  Patient in HTN urgency this morning with systolic 180-190 mmHg. 160's on 01/15/23 Started lisinopril 10 mg 1 week ago with good compliance but no means to check home BP, struggling some with cost.  I spoke with patient on the phone, just started his atorvastatin and iron this morning in addition to his lisinopril.  We will increase lisinopril to 20 mg daily, new rx sent, for now he can take two of the 10 mg daily until empty. Advised to monitor home BP, [employer] has a sale on an arm cuff for $19, patient would consider this. He also has a friend he is seeing this weekend who might let him have a BP cuff.

## 2023-01-17 NOTE — Therapy (Signed)
Pt arrived for visit. Vitals show elevated pressures in hypertensive urgency. PT session canceled. PCP contacted.   4:18 PM, 01/17/23 Rosamaria Lints, PT, DPT Physical Therapist - [facility] in [place]  [phone] (Office)

## 2023-01-17 NOTE — Telephone Encounter (Signed)
Pt arrived for PT appointment. First vitals assessment showing BP: 193/108 mmHg, HR 64bpm. Pt recently seen in office last week for this issue. Started 10mg  lisinopril today (this morning). Pressure similarly hypertensive as seen in last note with PA. Called PCP office to inquire. We decided to not do PT today.    4:04 PM, 01/17/23 Rosamaria Lints, PT, DPT Physical Therapist - [facility] in [place]  [phone] (Office)

## 2023-01-22 ENCOUNTER — Encounter: Payer: Self-pay | Admitting: Physical Therapy

## 2023-01-24 ENCOUNTER — Encounter: Payer: Self-pay | Admitting: Physical Therapy

## 2023-01-25 ENCOUNTER — Ambulatory Visit: Payer: Self-pay | Admitting: Physician Assistant

## 2023-01-29 ENCOUNTER — Ambulatory Visit: Payer: Worker's Compensation | Attending: Orthopedic Surgery | Admitting: Physical Therapy

## 2023-01-29 VITALS — BP 167/99 | HR 69

## 2023-01-29 DIAGNOSIS — R29898 Other symptoms and signs involving the musculoskeletal system: Secondary | ICD-10-CM | POA: Diagnosis present

## 2023-01-29 DIAGNOSIS — Z96642 Presence of left artificial hip joint: Secondary | ICD-10-CM | POA: Insufficient documentation

## 2023-01-29 DIAGNOSIS — R262 Difficulty in walking, not elsewhere classified: Secondary | ICD-10-CM | POA: Insufficient documentation

## 2023-01-29 DIAGNOSIS — R2689 Other abnormalities of gait and mobility: Secondary | ICD-10-CM | POA: Insufficient documentation

## 2023-01-29 NOTE — Therapy (Signed)
OUTPATIENT PHYSICAL THERAPY TREATMENT/GOAL UPDATE   Patient Name: Christian Hughes MRN: [id number] DOB:1961-06-03, 62 y.o., male Today's Date: 01/29/2023   END OF SESSION:   PT End of Session - 01/29/23 1500     Visit Number 19    Number of Visits 20    Date for PT Re-Evaluation 01/25/23    Authorization Type Workers Compensation    Authorization Time Period 12/18/22-02/22/23    Progress Note Due on Visit 20    PT Start Time 1500    PT Stop Time 1543    PT Time Calculation (min) 43 min    Activity Tolerance Patient tolerated treatment well;Patient limited by pain    Behavior During Therapy [facility] for tasks assessed/performed;Impulsive              Past Medical History:  Diagnosis Date   Engages in vaping    H/O ventral hernia    S/p of repair   Hypertension Early 2010's   Marijuana use    Orthostatic dizziness 10/08/2022   Past Surgical History:  Procedure Laterality Date   HIP ARTHROPLASTY Left 10/07/2022   Procedure: ARTHROPLASTY BIPOLAR HIP (HEMIARTHROPLASTY);  Surgeon: Juanell Fairly, MD;  Location: [facility] ORS;  Service: Orthopedics;  Laterality: Left;   JOINT REPLACEMENT  10/06/2022   Left Hip Replacement due fall while at work   Ventral hernia repair     Patient Active Problem List   Diagnosis Date Noted   Hyperlipidemia 01/10/2023   Prediabetes 01/10/2023   Primary hypertension 01/10/2023   Left ventricular hypertrophy 01/10/2023   Abnormal EKG 10/06/2022   Bone lesion_sclerotic lesions in the right iliac bone 10/06/2022    PCP: Dewaine Oats MD  REFERRING PROVIDER: Juanell Fairly MD   REFERRING DIAG: [phone] (ICD-10-CM) - Presence of left artificial hip joint   THERAPY DIAG:  History of left hip hemiarthroplasty  Weakness of left leg  Balance problems  Difficulty in walking, not elsewhere classified  Rationale for Evaluation and Treatment Rehabilitation  PERTINENT HISTORY: From initial eval: Christian Hughes is a 62 y.o. male without significant  past medical history except for occasional vaping amd marijuana use, who presents with fall and left hip pain.  Pt states that he slipped and fell at work at about 11:00 AM.  Denies loss of consciousness.  He injured his left hip, causing left hip pain, which is constant, sharp, severe, nonradiating, aggravated by movement.  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    "My mom took care of me after my hip surgery. I had therapy at home. I returned to my home alone after my first follow up appointment. I have pain in my buttocks, groin and the corner of my buttocks of about 2 to 3/10. I have high pain tolerance. I am unable to walk normal and I feel weak. I began to use this cane on my own after my PT at home stopped.  I can't wait to go to work because, I love to work."   PAIN:  Are you having pain? Yes: Pain location: 2-3/10 Pain description: stabbing Aggravating factors: Moving, walking, WB   Relieving factors: rest, OTC meds, ice    WEIGHT BEARING RESTRICTIONS: Yes:  WBAT   FALLS:  Has patient fallen in last 6 months? Yes. 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 Pt negotiates some small staircases in town. Pt has historically taken 24-packs of water bottles to his work for customers.    PLOF: Independent   PATIENT GOALS: "I want to return to work without hurting and falling as soon as I can."   NEXT MD VISIT: 11/22/2022    PRECAUTIONS: Posterior hip precautions   L hip hemiarthroplasty following L femoral neck  fracture (DOS: 10/07/22)    SUBJECTIVE:                                                                                                                                                                                      SUBJECTIVE STATEMENT: Pt's Lisinopril dosage was increased to 20 mg versus 10 mg; taking once per day in AM. Patient reports having "tweak" remaining in L groin. He is carrying his cane around more and relying on it less. Patient reports doing well with getting "point A to point B." Pt reports 75% global rating of function. Patient reports feeling confident with getting to places in town as needed for work. He states he can't carry cases of water and move furniture as needed for work.    PAIN:  Are you having pain? 2-3/10 pain in groin, spasm intermittently affecting L lateral thigh Pain location: L groin, L posterolateral hip    OBJECTIVE: (objective measures completed at initial evaluation unless otherwise dated)   PATIENT SURVEYS:  LEFS 18/80 FOTO 36   COGNITION: Overall cognitive status: Within functional limits for tasks assessed and impulsive.                            SENSATION: WFL   MUSCLE LENGTH: deferred  test 2/2 to pain.    POSTURE: rounded shoulders   PALPATION: Pain in L sacral border, Iliac crest, gluteus medius, TFL, and greater trochanter     LOWER EXTREMITY ROM:    Active ROM Right eval Left eval  Hip flexion WFL 20 deg with pain  Hip extension [facility]    Hip abduction WFL 20 with pain   Hip adduction [facility]    Hip internal rotation [facility]    Hip external rotation [facility]    Knee flexion [facility]  Knee extension [facility]  Ankle dorsiflexion [facility]  Ankle plantarflexion [facility] WFL  Ankle inversion [facility]  Ankle eversion [facility] WFL   (Blank rows = not tested)   LOWER EXTREMITY MMT:   MMT Right eval Left eval Left 12/04/22 Left 12/18/22 Left 01/29/23  Hip flexion WFL 2+ 4* 4- 4  Hip extension WFL 2 3+  3+*  Hip abduction WFl 2 4-  3+*  Hip  adduction WFl       Hip internal rotation [facility]       Hip external rotation [facility]       Knee flexion WFL 3- 3+ 4- 4  Knee extension WFl 3 4+ 4* 4*  Ankle dorsiflexion WFL 3+     Ankle plantarflexion WFL 4     Ankle inversion WFL 4     Ankle eversion WFL 3-      (Blank rows = not tested) Pt's L ankle remains inverted 10 degrees since the Surgery.     FUNCTIONAL TESTS (INITIAL EVALUATION) 30 seconds chair stand test: 5 reps completed Timed up and go (TUG): 19secs 10 meter walk test: 12secs= 1,2 m/secs             Balance Test : SL standing unable, Modified tandem 10 secs, Tandem Unable, Rhomberg with EO 5 secs and unable with EC.  GAIT: Distance walked: 30 ft  Assistive device utilized: Single point cane Level of assistance: SBA Comments: Pt unsteady on feet with severe antalgic gait, decreased loading response, absent heel strike and absent toe off gait with uneven stride and decreased speed. Heavy reliance on the [facility]       TODAY'S TREATMENT:                                                                                                                              DATE: 01/29/2023   Vitals taken pre-exercise: Today's Vitals   01/29/23 1506 01/29/23 1508  BP: (!) 180/100 (!) 167/99  Pulse: 75 69  SpO2: 100% 99%    There is no height or weight on file to calculate BMI.    Manual Therapy - for symptom modulation, soft tissue sensitivity and mobility, joint mobility, ROM   STM and IASTM with Theraband roller along hip flexors, rectus femoris, and vastus lateralis; x 5 minutes    Moist hot pack (unbilled) - for analgesic effect as needed for reduced pain and improved ability to participate in active PT intervention, along L anterior hip in supine with pillow between knees for comfort, x 5 minutes    Therapeutic Exercise - for improved soft tissue flexibility and extensibility as needed for ROM, improved strength as needed to improve performance of [facility] activities/functional  movements   *GOAL UPDATE PERFORMED  Nu-Step L3. Seat at 12 to maintain posterior hip flexion precautions. 5 min, L3 for warm up   BP post-exercise: 142/83 mmHg    *next visit* High knees 3x D/B length of // bars Side steps on airex beam in // bars: x6 laps, SBA. VC's for mirror feedback to promote upright posture  to reduce looking at feet in turn leading to anterior LOB. Intermittent need for UE support anteriorly due to heavy ankle/hip righting reactions.  Forward step up to Airex pad; 2x10 with either LE Sit to stand: 2 x 6; from chair with 8-lb Goblet hold  Toe tapping; 2x10 alternating R/L with 12-inch step, no UE support.    *not today* Sidelying hip abduction; 3x8, no VC's for neutral trunk positioning today. Good carryover from yesterday's session. Bridge 3x12:  Black Tband at distal femurs with good carryover in form/technique.     BP end of session: 156/87 mmHg    PATIENT EDUCATION:  Education details: Pt education about findings, rehab process, significance of fall prevention and use of FWW  and anatomy.  Person educated: Patient Education method: Medical illustrator Education comprehension: verbalized understanding   HOME EXERCISE PROGRAM: Access Code: [email or web address] URL: [email or web address] Date: 12/14/2022 Prepared by: Consuela Mimes  Exercises - Sit to Stand  - 1 x daily - 7 x weekly - 2 sets - 10 reps - Supine Bridge with Resistance Band  - 1 x daily - 7 x weekly - 2 sets - 10 reps - Sidelying Hip Abduction  - 1 x daily - 7 x weekly - 2 sets - 10 reps - Standing 3-Way Kick  - 1 x daily - 7 x weekly - 2 sets - 10 reps     ASSESSMENT:   CLINICAL IMPRESSION:  Patent has modestly improved MMTs; he still has remaining weakness and pain with loading hip extensors and abductors; hip flexor and quad/HS strength is good. Pt has modestly increased FOTO and LEFS; survey-based outcome measures still have significant need for further  improvement to attain long-term goals. Pt has met short-term goals for weaning from AD and improving tolerance of L hip ROM. Pt is completely weaned from walker at this time and is using [facility] for all functional mobility. He is able to ambulate household distance without AD, though mild antalgic pattern and abductor lurch is present (though it is less apparent than pt's gait pattern at outset of PT). Pt is still limited in walking at community-level without AD as needed for his work duties and with moving furniture and cases of water at work. Pt has had difficulty with BP control; this has improved with new Rx, and we are continuing to monitor BP for safety with exercise. Pt has remaining deficits in hip strength, postural control/balance, and weightbearing tolerance. Patient will benefit from continued skilled therapeutic intervention to address the above deficits as needed for improved function and QoL.      OBJECTIVE IMPAIRMENTS: Abnormal gait, decreased activity tolerance, decreased balance, decreased endurance, decreased knowledge of condition, decreased knowledge of use of DME, decreased mobility, difficulty walking, decreased ROM, decreased strength, and dizziness.    ACTIVITY LIMITATIONS: carrying, lifting, bending, sitting, standing, squatting, sleeping, stairs, transfers, dressing, and locomotion level   PARTICIPATION LIMITATIONS: cleaning, shopping, community activity, occupation, and yard work   PERSONAL FACTORS:  Pt is impulsive and eager to get better so that he can return to work.   are also affecting patient's functional outcome.    REHAB POTENTIAL: Good   CLINICAL DECISION MAKING: Stable/uncomplicated   EVALUATION COMPLEXITY: Low     GOALS: Goals reviewed with patient? Yes   SHORT TERM GOALS: Target date: 12/22/2022 Pt will improve L hip ROM to 10 to 90 degrees without pain in order to promote normal gait and stability.  Baseline: 20 degree Hip felx and 0 deg Ext 12/04/22:  90 deg  hip flex and 5 deg EXT 12/18/22: 90 deg hip flex and 10 deg hip EXT Goal status: ACHIEVED    2.  Pt will be able to ambulate 515ft safely with Cane without fall and pain . Baseline: 30 ft with SBA and heavy reliance on [facility].   12/04/22: Pt uses SPC for primary means of mobility in home and community at this time.  Goal status: ACHIEVED       LONG TERM GOALS: Target date: 01/26/2023   Pt will Improve TUG time to <10secs without SPC to demonstrate decreased fall risk.  Baseline: 19 secs with SPC 12/04/22: 14.6 seconds with SPC 12/18/22: 9.5 sec Goal status: ACHIEVED   2.  Pt will Improve LEFS score to >65/80 to demonstrate improved QoL Baseline: 18/80 12/04/22: 20/80  12/18/22: 22/80 01/29/23: 25/80  Goal status: ON-GOING   3.  Pt will be able to ambulate Independently  with LRAD without LOB and pain in order to return to community level mobility safely  Baseline: With [facility] and FWW.   12/04/22: ModI ambulation with SPC/LRAD with mild pain at longer distances, no LOB 12/18/22: Amb IND with SPC with no LOB Goal status: ACHIEVED    4.  Pt will score >50 on FOTO Baseline: 36/56 12/04/22: 35/56 12/18/22: 37/56 01/29/23: 43/56 Goal status: IN PROGRESS   5.  Pt will demonstrate 4/5 L hip strength without pain to improve stability and safety with all functional mobility in order to return to work.  Baseline: 2/5 grossly    12/04/22: 4-/5 grossly 12/18/22: 4-/5 grossly 01/29/23: 4/5 for hip flexors/quads/HS, 3+/5 for hip ABD and EXT.  Goal status: IN PROGRESS          PLAN:   PT FREQUENCY: 2x/week   PT DURATION: 4 weeks   PLANNED INTERVENTIONS: Therapeutic exercises, Therapeutic activity, Neuromuscular re-education, Balance training, Gait training, Patient/Family education, Self Care, and Joint mobilization   PLAN FOR NEXT SESSION: Progressive gait and stepping activity with gradual AD wean, progressive LE strengthening. Manual therapy and/or modalities as needed for anterior hip and/or  posterolateral hip pain.     Consuela Mimes, PT, DPT [id number]  Physical Therapist- [facility]  01/29/2023, 3:11 PM

## 2023-01-31 ENCOUNTER — Ambulatory Visit: Payer: Worker's Compensation | Admitting: Physical Therapy

## 2023-01-31 ENCOUNTER — Encounter: Payer: Self-pay | Admitting: Physical Therapy

## 2023-01-31 VITALS — BP 151/89 | HR 67

## 2023-01-31 DIAGNOSIS — Z96642 Presence of left artificial hip joint: Secondary | ICD-10-CM | POA: Diagnosis not present

## 2023-01-31 DIAGNOSIS — R262 Difficulty in walking, not elsewhere classified: Secondary | ICD-10-CM

## 2023-01-31 DIAGNOSIS — R29898 Other symptoms and signs involving the musculoskeletal system: Secondary | ICD-10-CM

## 2023-01-31 DIAGNOSIS — R2689 Other abnormalities of gait and mobility: Secondary | ICD-10-CM

## 2023-01-31 NOTE — Therapy (Signed)
OUTPATIENT PHYSICAL THERAPY TREATMENT AND PROGRESS NOTE   Dates of reporting period  11/15/22   to   01/31/23    Patient Name: Christian Hughes MRN: [id number] DOB:02-08-1961, 62 y.o., male Today's Date: 01/31/2023   END OF SESSION:   PT End of Session - 01/31/23 1329     Visit Number 20    Number of Visits 20    Date for PT Re-Evaluation 01/25/23    Authorization Type Workers Compensation    Authorization Time Period 12/18/22-02/22/23    Progress Note Due on Visit 20    PT Start Time 1330    PT Stop Time 1413    PT Time Calculation (min) 43 min    Activity Tolerance Patient tolerated treatment well;Patient limited by pain    Behavior During Therapy [facility] for tasks assessed/performed;Impulsive               Past Medical History:  Diagnosis Date   Engages in vaping    H/O ventral hernia    S/p of repair   Hypertension Early 2010's   Marijuana use    Orthostatic dizziness 10/08/2022   Past Surgical History:  Procedure Laterality Date   HIP ARTHROPLASTY Left 10/07/2022   Procedure: ARTHROPLASTY BIPOLAR HIP (HEMIARTHROPLASTY);  Surgeon: Juanell Fairly, MD;  Location: [facility] ORS;  Service: Orthopedics;  Laterality: Left;   JOINT REPLACEMENT  10/06/2022   Left Hip Replacement due fall while at work   Ventral hernia repair     Patient Active Problem List   Diagnosis Date Noted   Hyperlipidemia 01/10/2023   Prediabetes 01/10/2023   Primary hypertension 01/10/2023   Left ventricular hypertrophy 01/10/2023   Abnormal EKG 10/06/2022   Bone lesion_sclerotic lesions in the right iliac bone 10/06/2022    PCP: Dewaine Oats MD  REFERRING PROVIDER: Juanell Fairly MD   REFERRING DIAG: [phone] (ICD-10-CM) - Presence of left artificial hip joint   THERAPY DIAG:  History of left hip hemiarthroplasty  Weakness of left leg  Balance problems  Difficulty in walking, not elsewhere classified  Rationale for Evaluation and Treatment Rehabilitation  PERTINENT HISTORY: From  initial eval: Christian Hughes is a 62 y.o. male without significant past medical history except for occasional vaping amd marijuana use, who presents with fall and left hip pain.  Pt states that he slipped and fell at work at about 11:00 AM.  Denies loss of consciousness.  He injured his left hip, causing left hip pain, which is constant, sharp, severe, nonradiating, aggravated by movement.  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    "My mom took care of me after my hip surgery. I had therapy at home. I returned to my home alone after my first follow up appointment. I have pain in my buttocks, groin and the corner of my buttocks of about 2 to 3/10. I have high pain tolerance. I am unable to walk normal and I feel weak. I began to use this cane on my own after my PT at home stopped.  I can't wait to go to work because, I love to work."   PAIN:  Are you having pain? Yes: Pain location: 2-3/10 Pain description: stabbing Aggravating factors: Moving, walking, WB   Relieving factors: rest, OTC meds, ice    WEIGHT BEARING RESTRICTIONS: Yes:  WBAT   FALLS:  Has patient fallen in last 6 months? Yes. 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 Pt negotiates some small staircases in town. Pt has historically taken 24-packs of water bottles to his work for customers.    PLOF: Independent   PATIENT GOALS: "I want to return to work without hurting and falling as soon as I can."   NEXT MD VISIT: 11/22/2022    PRECAUTIONS: Posterior hip  precautions   L hip hemiarthroplasty following L femoral neck fracture (DOS: 10/07/22)    SUBJECTIVE:                                                                                                                                                                                      SUBJECTIVE STATEMENT:  Pt reports 75% global rating of function. Patient reports episode of spasming Monday night after testing earlier this week. Patient is carrying his SPC more now and he reports not having to use it for most short-distance gait and home-level mobility. Pt reports that he is "over halfway" in regard to getting back to work, but he is not 100% ready for return to work.    PAIN:  Are you having pain? 1.5-2/10 pain in groin, spasm intermittently affecting L lateral thigh Pain location: L groin, L posterolateral hip    OBJECTIVE: (objective measures completed at initial evaluation unless otherwise dated)   PATIENT SURVEYS:  LEFS 18/80 FOTO 36   COGNITION: Overall cognitive status: Within functional limits for tasks assessed and impulsive.                            SENSATION: WFL   MUSCLE LENGTH: deferred  test 2/2 to pain.    POSTURE: rounded shoulders   PALPATION: Pain in L sacral border, Iliac crest, gluteus medius, TFL, and greater trochanter     LOWER EXTREMITY ROM:    Active ROM Right eval Left eval  Hip flexion WFL 20 deg with pain  Hip extension [facility]    Hip abduction WFL 20 with pain   Hip adduction [facility]    Hip internal rotation [facility]    Hip external rotation [facility]    Knee flexion [facility]  Knee extension [facility]  Ankle dorsiflexion [facility]  Ankle plantarflexion [facility] WFL  Ankle inversion [facility]  Ankle eversion [facility] WFL   (Blank rows = not tested)   LOWER EXTREMITY MMT:   MMT Right eval Left eval Left 12/04/22 Left 12/18/22 Left 01/29/23  Hip flexion WFL 2+ 4* 4- 4  Hip extension WFL 2 3+  3+*  Hip abduction WFl 2 4-  3+*  Hip adduction WFl       Hip internal  rotation [facility]       Hip external rotation [facility]       Knee flexion WFL 3- 3+ 4- 4  Knee extension WFl 3 4+ 4* 4*  Ankle dorsiflexion WFL 3+     Ankle plantarflexion WFL 4     Ankle inversion WFL 4     Ankle eversion WFL 3-      (Blank rows = not tested) Pt's L ankle remains inverted 10 degrees since the Surgery.     FUNCTIONAL TESTS (INITIAL EVALUATION) 30 seconds chair stand test: 5 reps completed Timed up and go (TUG): 19secs 10 meter walk test: 12secs= 1,2 m/secs             Balance Test : SL standing unable, Modified tandem 10 secs, Tandem Unable, Rhomberg with EO 5 secs and unable with EC.  GAIT: Distance walked: 30 ft  Assistive device utilized: Single point cane Level of assistance: SBA Comments: Pt unsteady on feet with severe antalgic gait, decreased loading response, absent heel strike and absent toe off gait with uneven stride and decreased speed. Heavy reliance on the [facility]       TODAY'S TREATMENT:                                                                                                                              DATE: 01/31/2023   Vitals taken pre-exercise: Today's Vitals   01/31/23 1340  BP: (!) 151/89  Pulse: 67     There is no height or weight on file to calculate BMI.    Therapeutic Exercise - for improved soft tissue flexibility and extensibility as needed for ROM, improved strength as needed to improve performance of CKC activities/functional movements   Nu-Step L4. Seat at 12 to maintain posterior hip flexion precautions. 5 min, L3 for warm up  High knees 3x D/B length of // bars Side steps in // bars: x3 D/B with [place] Si Tband Forward step up to Airex pad; 1x10 and 1x9 with surgical LE leading  Sit to stand: 1x6 and 1x5; from chair with 8-lb Goblet hold  Toe tapping; 2x10 alternating R/L with 12-inch step, no UE support.    *not today* Sidelying hip abduction; 3x8, no VC's for neutral trunk positioning today. Good carryover from yesterday's  session. Bridge 3x12:  Black Tband at distal femurs with good carryover in form/technique.     Moist hot pack (unbilled) - for analgesic effect as needed for reduced pain and improved ability to participate in active PT intervention, along L anterior hip in supine with pillow between  knees for comfort, x 5 minutes     BP end of session: 155/93 mmHg      PATIENT EDUCATION:  Education details: Pt education about findings, rehab process, significance of fall prevention and use of FWW  and anatomy.  Person educated: Patient Education method: Medical illustrator Education comprehension: verbalized understanding   HOME EXERCISE PROGRAM: Access Code: [email or web address] URL: [email or web address] Date: 12/14/2022 Prepared by: Consuela Mimes  Exercises - Sit to Stand  - 1 x daily - 7 x weekly - 2 sets - 10 reps - Supine Bridge with Resistance Band  - 1 x daily - 7 x weekly - 2 sets - 10 reps - Sidelying Hip Abduction  - 1 x daily - 7 x weekly - 2 sets - 10 reps - Standing 3-Way Kick  - 1 x daily - 7 x weekly - 2 sets - 10 reps     ASSESSMENT:   CLINICAL IMPRESSION:  Patent's goals were updated this past Monday 01/29/23; updated information and data was carried forward for today's progress note. Pt has fully weaned from walker and is less reliant on [facility] for home-level mobility/short distance gait. He has ongoing hip abductor and extensor strength deficits. Quad/HS strength has markedly improved. Survey-based outcome measures have improved slowly to date with pt not yet meeting these long-term goals (for FOTO and LEFS). Pt does still have some difficulty with high volume of weightbearing/weight shifting activity with groin pain. Pain in medial groin is continuous and increases intermittently with closed-chain/weightbearing work. Pt will likely need more authorization following completion of his currently authorized visits given remaining deficits remaining deficits in hip  strength, postural control/balance, and weightbearing tolerance. Pt is not able to perform prolonged weightbearing and intermittent carrying/lifting tasks required for his job. Patient will benefit from continued skilled therapeutic intervention to address the above deficits as needed for improved function and QoL.      OBJECTIVE IMPAIRMENTS: Abnormal gait, decreased activity tolerance, decreased balance, decreased endurance, decreased knowledge of condition, decreased knowledge of use of DME, decreased mobility, difficulty walking, decreased ROM, decreased strength, and dizziness.    ACTIVITY LIMITATIONS: carrying, lifting, bending, sitting, standing, squatting, sleeping, stairs, transfers, dressing, and locomotion level   PARTICIPATION LIMITATIONS: cleaning, shopping, community activity, occupation, and yard work   PERSONAL FACTORS:  Pt is impulsive and eager to get better so that he can return to work.   are also affecting patient's functional outcome.    REHAB POTENTIAL: Good   CLINICAL DECISION MAKING: Stable/uncomplicated   EVALUATION COMPLEXITY: Low     GOALS: Goals reviewed with patient? Yes   SHORT TERM GOALS: Target date: 12/22/2022 Pt will improve L hip ROM to 10 to 90 degrees without pain in order to promote normal gait and stability.  Baseline: 20 degree Hip felx and 0 deg Ext 12/04/22: 90 deg hip flex and 5 deg EXT 12/18/22: 90 deg hip flex and 10 deg hip EXT Goal status: ACHIEVED    2.  Pt will be able to ambulate 556ft safely with Cane without fall and pain . Baseline: 30 ft with SBA and heavy reliance on [facility].   12/04/22: Pt uses SPC for primary means of mobility in home and community at this time.  Goal status: ACHIEVED       LONG TERM GOALS: Target date: 01/26/2023   Pt will Improve TUG time to <10secs without SPC to demonstrate decreased fall risk.  Baseline: 19 secs with SPC 12/04/22: 14.6 seconds with SPC 12/18/22: 9.5 sec Goal  status: ACHIEVED   2.  Pt will  Improve LEFS score to >65/80 to demonstrate improved QoL Baseline: 18/80 12/04/22: 20/80  12/18/22: 22/80 01/29/23: 25/80  Goal status: ON-GOING   3.  Pt will be able to ambulate Independently  with LRAD without LOB and pain in order to return to community level mobility safely  Baseline: With [facility] and FWW.   12/04/22: ModI ambulation with SPC/LRAD with mild pain at longer distances, no LOB 12/18/22: Amb IND with SPC with no LOB Goal status: ACHIEVED    4.  Pt will score >50 on FOTO Baseline: 36/56 12/04/22: 35/56 12/18/22: 37/56 01/29/23: 43/56 Goal status: IN PROGRESS   5.  Pt will demonstrate 4/5 L hip strength without pain to improve stability and safety with all functional mobility in order to return to work.  Baseline: 2/5 grossly    12/04/22: 4-/5 grossly 12/18/22: 4-/5 grossly 01/29/23: 4/5 for hip flexors/quads/HS, 3+/5 for hip ABD and EXT.  Goal status: IN PROGRESS          PLAN:   PT FREQUENCY: 2x/week   PT DURATION: 4 weeks   PLANNED INTERVENTIONS: Therapeutic exercises, Therapeutic activity, Neuromuscular re-education, Balance training, Gait training, Patient/Family education, Self Care, and Joint mobilization   PLAN FOR NEXT SESSION: Progressive gait and stepping activity with gradual AD wean, progressive LE strengthening. Manual therapy and/or modalities as needed for anterior hip and/or posterolateral hip pain.     Consuela Mimes, PT, DPT [phone]  Physical Therapist- [facility]  01/31/2023, 1:47 PM

## 2023-02-05 ENCOUNTER — Ambulatory Visit: Payer: Worker's Compensation | Admitting: Physical Therapy

## 2023-02-05 ENCOUNTER — Telehealth: Payer: Self-pay | Admitting: Physician Assistant

## 2023-02-05 ENCOUNTER — Encounter: Payer: Self-pay | Admitting: Physical Therapy

## 2023-02-05 VITALS — BP 163/100 | HR 74

## 2023-02-05 DIAGNOSIS — Z96642 Presence of left artificial hip joint: Secondary | ICD-10-CM

## 2023-02-05 DIAGNOSIS — R2689 Other abnormalities of gait and mobility: Secondary | ICD-10-CM

## 2023-02-05 DIAGNOSIS — R29898 Other symptoms and signs involving the musculoskeletal system: Secondary | ICD-10-CM

## 2023-02-05 DIAGNOSIS — R262 Difficulty in walking, not elsewhere classified: Secondary | ICD-10-CM

## 2023-02-05 NOTE — Therapy (Signed)
OUTPATIENT PHYSICAL THERAPY TREATMENT    Patient Name: Christian Hughes MRN: [id number] DOB:[birth year], 62 y.o., male Today's Date: 02/05/2023   END OF SESSION:   PT End of Session - 02/05/23 1502     Visit Number 21    Number of Visits 28    Date for PT Re-Evaluation 01/25/23    Authorization Type Workers Compensation    Authorization Time Period 12/18/22-02/22/23    Progress Note Due on Visit 20    PT Start Time 1500    PT Stop Time 1523    PT Time Calculation (min) 23 min    Activity Tolerance Patient tolerated treatment well;Patient limited by pain    Behavior During Therapy [facility] for tasks assessed/performed;Impulsive             Past Medical History:  Diagnosis Date   Engages in vaping    H/O ventral hernia    S/p of repair   Hypertension Early 2010's   Marijuana use    Orthostatic dizziness 10/08/2022   Past Surgical History:  Procedure Laterality Date   HIP ARTHROPLASTY Left 10/07/2022   Procedure: ARTHROPLASTY BIPOLAR HIP (HEMIARTHROPLASTY);  Surgeon: Juanell Fairly, MD;  Location: [facility] ORS;  Service: Orthopedics;  Laterality: Left;   JOINT REPLACEMENT  10/06/2022   Left Hip Replacement due fall while at work   Ventral hernia repair     Patient Active Problem List   Diagnosis Date Noted   Hyperlipidemia 01/10/2023   Prediabetes 01/10/2023   Primary hypertension 01/10/2023   Left ventricular hypertrophy 01/10/2023   Abnormal EKG 10/06/2022   Bone lesion_sclerotic lesions in the right iliac bone 10/06/2022    PCP: Dewaine Oats MD  REFERRING PROVIDER: Juanell Fairly MD   REFERRING DIAG: [phone] (ICD-10-CM) - Presence of left artificial hip joint   THERAPY DIAG:  History of left hip hemiarthroplasty  Weakness of left leg  Balance problems  Difficulty in walking, not elsewhere classified  Rationale for Evaluation and Treatment Rehabilitation  PERTINENT HISTORY: From initial eval: Christian Hughes is a 63 y.o. male without significant past medical  history except for occasional vaping amd marijuana use, who presents with fall and left hip pain.  Pt states that he slipped and fell at work at about 11:00 AM.  Denies loss of consciousness.  He injured his left hip, causing left hip pain, which is constant, sharp, severe, nonradiating, aggravated by movement.  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    "My mom took care of me after my hip surgery. I had therapy at home. I returned to my home alone after my first follow up appointment. I have pain in my buttocks, groin and the corner of my buttocks of about 2 to 3/10. I have high pain tolerance. I am unable to walk normal and I feel weak. I began to use this cane on my own after my PT at home stopped.  I can't wait to go to work because, I love to work."   PAIN:  Are you having pain? Yes: Pain location: 2-3/10 Pain description: stabbing Aggravating factors: Moving, walking, WB   Relieving factors: rest, OTC meds, ice    WEIGHT BEARING RESTRICTIONS: Yes:  WBAT   FALLS:  Has patient fallen in last 6 months? Yes. 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 Pt negotiates some small staircases in town. Pt has historically taken 24-packs of water bottles to his work for customers.    PLOF: Independent   PATIENT GOALS: "I want to return to work without hurting and falling as soon as I can."   NEXT MD VISIT: 11/22/2022    PRECAUTIONS: Posterior hip precautions   L hip hemiarthroplasty following L femoral neck fracture (DOS:  10/07/22)    SUBJECTIVE:                                                                                                                                                                                      SUBJECTIVE STATEMENT:  Pt reports feeling more achy with inclement/rainy weather. Pt reports ongoing sensation of jabbing/knife in L medial groin region. Patient reports doing well with HEP. He reports some high BP numbers over the last day with SBP intermittently in the 190s.    PAIN:  Are you having pain? 1.5-2/10 pain in groin, spasm intermittently affecting L lateral thigh Pain location: L groin, L posterolateral hip    OBJECTIVE: (objective measures completed at initial evaluation unless otherwise dated)   PATIENT SURVEYS:  LEFS 18/80 FOTO 36   COGNITION: Overall cognitive status: Within functional limits for tasks assessed and impulsive.                            SENSATION: WFL   MUSCLE LENGTH: deferred  test 2/2 to pain.    POSTURE: rounded shoulders   PALPATION: Pain in L sacral border, Iliac crest, gluteus medius, TFL, and greater trochanter     LOWER EXTREMITY ROM:    Active ROM Right eval Left eval  Hip flexion WFL 20 deg with pain  Hip extension [facility]    Hip abduction WFL 20 with pain   Hip adduction [facility]    Hip internal rotation [facility]    Hip external rotation [facility]    Knee flexion [facility]  Knee extension [facility]  Ankle dorsiflexion [facility]  Ankle plantarflexion [facility]  Ankle inversion [facility]  Ankle eversion WFL WFL   (Blank rows = not tested)   LOWER EXTREMITY MMT:   MMT Right eval Left eval Left 12/04/22 Left 12/18/22 Left 01/29/23  Hip flexion WFL 2+ 4* 4- 4  Hip extension WFL 2  3+  3+*  Hip abduction [facility] 2 4-  3+*  Hip adduction [facility]       Hip internal rotation [facility]       Hip external rotation [facility]       Knee flexion WFL 3- 3+ 4- 4  Knee extension WFl 3 4+ 4* 4*  Ankle dorsiflexion WFL 3+     Ankle plantarflexion WFL 4     Ankle inversion  WFL 4     Ankle eversion WFL 3-      (Blank rows = not tested) Pt's L ankle remains inverted 10 degrees since the Surgery.     FUNCTIONAL TESTS (INITIAL EVALUATION) 30 seconds chair stand test: 5 reps completed Timed up and go (TUG): 19secs 10 meter walk test: 12secs= 1,2 m/secs             Balance Test : SL standing unable, Modified tandem 10 secs, Tandem Unable, Rhomberg with EO 5 secs and unable with EC.  GAIT: Distance walked: 30 ft  Assistive device utilized: Single point cane Level of assistance: SBA Comments: Pt unsteady on feet with severe antalgic gait, decreased loading response, absent heel strike and absent toe off gait with uneven stride and decreased speed. Heavy reliance on the [facility]       TODAY'S TREATMENT:                                                                                                                              DATE: 02/05/2023   Vitals taken pre-exercise: Today's Vitals   02/05/23 1511 02/05/23 1514 02/05/23 1517 02/05/23 1520  BP: (!) 185/109 (!) 176/103 (!) 174/110 (!) 163/100  Pulse: 67  74   SpO2: 97%  99%       There is no height or weight on file to calculate BMI.    Therapeutic Exercise - for improved soft tissue flexibility and extensibility as needed for ROM, improved strength as needed to improve performance of CKC activities/functional movements   Nu-Step L4. Seat at 12 to maintain posterior hip flexion precautions. 5 min, L3 for warm up  -pt attests to feeling headache during warm-up; exercise was stopped and vital signs taken (see above)   Monitored blood pressure/vitals today and gathered subjective information regarding current symptoms. Patient was educated on risks of ongoing high resting blood pressure and contraindication of exercise given BP values today. We instructed patient on strategies for avoiding increase in blood pressure (opting for low-sodium options, avoiding soft drinks as recommended by MD, avoiding  "convenience foods") and held on exercise performance today. Pt to continue monitoring BP at home; discussed signs of hypertensive emergency warranting immediate emergency medical attention. Pt is following up with MD tomorrow.    PATIENT EDUCATION:  Education details: Pt education about findings, rehab process, significance of fall prevention and use of FWW  and anatomy.  Person educated: Patient Education method: Medical illustrator Education comprehension: verbalized understanding   HOME EXERCISE  PROGRAM: Access Code: [email or web address] URL: [email or web address] Date: 12/14/2022 Prepared by: Consuela Mimes  Exercises - Sit to Stand  - 1 x daily - 7 x weekly - 2 sets - 10 reps - Supine Bridge with Resistance Band  - 1 x daily - 7 x weekly - 2 sets - 10 reps - Sidelying Hip Abduction  - 1 x daily - 7 x weekly - 2 sets - 10 reps - Standing 3-Way Kick  - 1 x daily - 7 x weekly - 2 sets - 10 reps     ASSESSMENT:   CLINICAL IMPRESSION:  Patent unfortunately has had to hold on active exercise in PT during multiple sessions due to blood pressure contraindicating exercise. Pt has previous episode of noted hypertensive urgency and his measures are borderline hypertensive urgency today. Pt has been working with PCP on medical management for HTN. We discussed at length BP values and S&S necessitating immediate emergency medical attention. Pt to f/u with his PCP tomorrow on current medical management for HTN. Pending improvement in blood pressure control, patient will benefit from continued skilled therapeutic intervention to address the remaining deficits in hip strength, mobility, and weightbearing tolerance as needed for improved function and ability to perform work duties.      OBJECTIVE IMPAIRMENTS: Abnormal gait, decreased activity tolerance, decreased balance, decreased endurance, decreased knowledge of condition, decreased knowledge of use of DME, decreased mobility,  difficulty walking, decreased ROM, decreased strength, and dizziness.    ACTIVITY LIMITATIONS: carrying, lifting, bending, sitting, standing, squatting, sleeping, stairs, transfers, dressing, and locomotion level   PARTICIPATION LIMITATIONS: cleaning, shopping, community activity, occupation, and yard work   PERSONAL FACTORS:  Pt is impulsive and eager to get better so that he can return to work.   are also affecting patient's functional outcome.    REHAB POTENTIAL: Good   CLINICAL DECISION MAKING: Stable/uncomplicated   EVALUATION COMPLEXITY: Low     GOALS: Goals reviewed with patient? Yes   SHORT TERM GOALS: Target date: 12/22/2022 Pt will improve L hip ROM to 10 to 90 degrees without pain in order to promote normal gait and stability.  Baseline: 20 degree Hip felx and 0 deg Ext 12/04/22: 90 deg hip flex and 5 deg EXT 12/18/22: 90 deg hip flex and 10 deg hip EXT Goal status: ACHIEVED    2.  Pt will be able to ambulate 584ft safely with Cane without fall and pain . Baseline: 30 ft with SBA and heavy reliance on [facility].   12/04/22: Pt uses SPC for primary means of mobility in home and community at this time.  Goal status: ACHIEVED       LONG TERM GOALS: Target date: 01/26/2023   Pt will Improve TUG time to <10secs without SPC to demonstrate decreased fall risk.  Baseline: 19 secs with SPC 12/04/22: 14.6 seconds with SPC 12/18/22: 9.5 sec Goal status: ACHIEVED   2.  Pt will Improve LEFS score to >65/80 to demonstrate improved QoL Baseline: 18/80 12/04/22: 20/80  12/18/22: 22/80 01/29/23: 25/80  Goal status: ON-GOING   3.  Pt will be able to ambulate Independently  with LRAD without LOB and pain in order to return to community level mobility safely  Baseline: With [facility] and FWW.   12/04/22: ModI ambulation with SPC/LRAD with mild pain at longer distances, no LOB 12/18/22: Amb IND with SPC with no LOB Goal status: ACHIEVED    4.  Pt will score >50 on FOTO Baseline: 36/56 12/04/22:  35/56 12/18/22: 37/56 01/29/23: 43/56 Goal  status: IN PROGRESS   5.  Pt will demonstrate 4/5 L hip strength without pain to improve stability and safety with all functional mobility in order to return to work.  Baseline: 2/5 grossly    12/04/22: 4-/5 grossly 12/18/22: 4-/5 grossly 01/29/23: 4/5 for hip flexors/quads/HS, 3+/5 for hip ABD and EXT.  Goal status: IN PROGRESS          PLAN:   PT FREQUENCY: 2x/week   PT DURATION: 4 weeks   PLANNED INTERVENTIONS: Therapeutic exercises, Therapeutic activity, Neuromuscular re-education, Balance training, Gait training, Patient/Family education, Self Care, and Joint mobilization   PLAN FOR NEXT SESSION: Progressive gait and stepping activity with gradual AD wean, progressive LE strengthening. Manual therapy and/or modalities as needed for anterior hip and/or posterolateral hip pain.  F/u on blood pressure control and take vital signs at arrival.    Consuela Mimes, PT, DPT [phone]  Physical Therapist- [facility]  02/05/2023, 3:36 PM

## 2023-02-05 NOTE — Telephone Encounter (Signed)
Please review.  KP

## 2023-02-05 NOTE — Telephone Encounter (Signed)
Christian Hughes calling from [facility] Pt is calling to report that the pt the pt has elevated BP. Want Dan to know that the patients diastolic 100-110. Phone Number [phone]

## 2023-02-05 NOTE — Telephone Encounter (Signed)
Noted  KP 

## 2023-02-06 ENCOUNTER — Encounter: Payer: Self-pay | Admitting: Physician Assistant

## 2023-02-06 ENCOUNTER — Other Ambulatory Visit: Payer: Self-pay

## 2023-02-06 ENCOUNTER — Ambulatory Visit
Admission: RE | Admit: 2023-02-06 | Discharge: 2023-02-06 | Disposition: A | Discharge status: Home / Self Care | Payer: Self-pay | Attending: Physician Assistant | Admitting: Physician Assistant

## 2023-02-06 ENCOUNTER — Ambulatory Visit
Admission: RE | Admit: 2023-02-06 | Discharge: 2023-02-06 | Disposition: A | Discharge status: Home / Self Care | Payer: Self-pay | Source: Ambulatory Visit | Attending: Physician Assistant | Admitting: Physician Assistant

## 2023-02-06 ENCOUNTER — Ambulatory Visit (INDEPENDENT_AMBULATORY_CARE_PROVIDER_SITE_OTHER): Payer: Self-pay | Admitting: Physician Assistant

## 2023-02-06 VITALS — BP 134/94 | HR 71 | Ht 72.0 in | Wt 201.0 lb

## 2023-02-06 DIAGNOSIS — I10 Essential (primary) hypertension: Secondary | ICD-10-CM

## 2023-02-06 DIAGNOSIS — R9389 Abnormal findings on diagnostic imaging of other specified body structures: Secondary | ICD-10-CM | POA: Insufficient documentation

## 2023-02-06 MED ORDER — LISINOPRIL 40 MG PO TABS: 40.0000 mg | ORAL_TABLET | Freq: Every day | ORAL | 1 refills | Status: DC

## 2023-02-06 NOTE — Progress Notes (Signed)
Date:  02/06/2023   Name:  Christian Hughes   DOB:  1960-11-25   MRN:  [id number]   Chief Complaint: Hypertension  HPI Christian Hughes returns today primarily for follow-up on hypertension usually seen by me 01/10/2023 and started on lisinopril 10 mg, which was increased to 20 mg after 1 week.  Patient reports good tolerance and compliance with medication and has been monitoring blood pressure with a wrist cuff at home.  Home BP readings typically 140-170 systolic, but sometimes as low as 110 or as high as 180s.  Denies chest pain, dyspnea, headaches, vision changes.  Of note, following his fall and LEFT hip fracture 10/06/2022 it was noted that he had unusual findings of the RIGHT ilium interpreted as bony islands vs sclerotic lesions.  X-ray was repeated 10/07/2022.  Radiologist note stated metastasis could not be excluded.  Combining this with his iron deficiency anemia, colonoscopy is warranted but unfortunately he does not have health insurance at this time.  He intends to work with his employer in [employer] as soon as possible.  His last colonoscopy was with [facility] per prior provider Dr. Maree Krabbe notes - no evidence of this on chart review however.  Medication list has been reviewed and updated.  Current Meds  Medication Sig   atorvastatin (LIPITOR) 10 MG tablet Take 1 tablet (10 mg total) by mouth daily.   Ferrous Sulfate (IRON PO) Take by mouth daily.   lisinopril (ZESTRIL) 40 MG tablet Take 1 tablet (40 mg total) by mouth daily.   [DISCONTINUED] lisinopril (ZESTRIL) 20 MG tablet Take 1 tablet (20 mg total) by mouth daily.     Review of Systems  Constitutional:  Negative for fatigue and fever.  Respiratory:  Negative for chest tightness and shortness of breath.   Cardiovascular:  Negative for chest pain and palpitations.  Gastrointestinal:  Negative for abdominal pain.    Patient Active Problem List   Diagnosis Date Noted   Hyperlipidemia 01/10/2023   Prediabetes 01/10/2023    Primary hypertension 01/10/2023   Left ventricular hypertrophy 01/10/2023   Abnormal EKG 10/06/2022   Bone lesion_sclerotic lesions in the right iliac bone 10/06/2022    No Known Allergies   There is no immunization history on file for this patient.  Past Surgical History:  Procedure Laterality Date   HIP ARTHROPLASTY Left 10/07/2022   Procedure: ARTHROPLASTY BIPOLAR HIP (HEMIARTHROPLASTY);  Surgeon: Juanell Fairly, MD;  Location: [facility] ORS;  Service: Orthopedics;  Laterality: Left;   JOINT REPLACEMENT  10/06/2022   Left Hip Replacement due fall while at work   Ventral hernia repair      Social History   Tobacco Use   Smoking status: Never   Smokeless tobacco: Never   Tobacco comments:    Occasional vaping  Vaping Use   Vaping Use: Never used  Substance Use Topics   Alcohol use: Not Currently    Comment: Very seldom, special occasions.   Drug use: Not Currently    Types: Marijuana    History reviewed. No pertinent family history.      02/06/2023   11:22 AM 01/10/2023    2:06 PM  GAD 7 : Generalized Anxiety Score  Nervous, Anxious, on Edge 0 0  Control/stop worrying 0 0  Worry too much - different things 0 0  Trouble relaxing 0 0  Restless 0 0  Easily annoyed or irritable 0 0  Afraid - awful might happen 0 0  Total GAD 7 Score 0 0  Anxiety Difficulty Not difficult at all Not difficult at all       02/06/2023   11:22 AM 01/10/2023    2:06 PM  Depression screen PHQ 2/9  Decreased Interest 0 0  Down, Depressed, Hopeless 0 0  PHQ - 2 Score 0 0  Altered sleeping 0 0  Tired, decreased energy 0 0  Change in appetite 0 0  Feeling bad or failure about yourself  0 0  Trouble concentrating 0 0  Moving slowly or fidgety/restless 0 0  Suicidal thoughts 0 0  PHQ-9 Score 0 0  Difficult doing work/chores Not difficult at all Not difficult at all    BP Readings from Last 3 Encounters:  02/06/23 (!) 134/94  02/05/23 (!) 163/100  01/31/23 (!) 151/89    Wt  Readings from Last 3 Encounters:  02/06/23 201 lb (91.2 kg)  01/10/23 202 lb (91.6 kg)  10/06/22 220 lb (99.8 kg)    BP (!) 134/94 (BP Location: Left Arm, Patient Position: Sitting, Cuff Size: Large)   Pulse 71   Ht 6' (1.829 m)   Wt 201 lb (91.2 kg)   SpO2 98%   BMI 27.26 kg/m   Physical Exam Vitals and nursing note reviewed.  Constitutional:      Appearance: Normal appearance.  Cardiovascular:     Rate and Rhythm: Normal rate.  Pulmonary:     Effort: Pulmonary effort is normal.  Abdominal:     General: There is no distension.  Musculoskeletal:        General: Normal range of motion.  Skin:    General: Skin is warm and dry.  Neurological:     Mental Status: He is alert and oriented to person, place, and time.     Gait: Gait is intact.     Comments: Walks with cane  Psychiatric:        Mood and Affect: Mood and affect normal.     Recent Labs     Component Value Date/Time   NA 141 01/11/2023 0914   K 4.4 01/11/2023 0914   CL 105 01/11/2023 0914   CO2 23 01/11/2023 0914   GLUCOSE 100 (H) 01/11/2023 0914   GLUCOSE 105 (H) 10/10/2022 0415   BUN 13 01/11/2023 0914   CREATININE 0.94 01/11/2023 0914   CALCIUM 9.7 01/11/2023 0914   PROT 6.7 01/11/2023 0914   ALBUMIN 4.1 01/11/2023 0914   AST 16 01/11/2023 0914   ALT 11 01/11/2023 0914   ALKPHOS 115 01/11/2023 0914   BILITOT <0.2 01/11/2023 0914   GFRNONAA >60 10/10/2022 0415    Lab Results  Component Value Date   WBC 8.8 01/11/2023   HGB 11.6 (L) 01/11/2023   HCT 36.0 (L) 01/11/2023   MCV 86 01/11/2023   PLT 333 01/11/2023   Lab Results  Component Value Date   HGBA1C 6.0 (H) 10/06/2022   Lab Results  Component Value Date   CHOL 223 (H) 10/06/2022   HDL 43 10/06/2022   LDLCALC 152 (H) 10/06/2022   TRIG 140 10/06/2022   CHOLHDL 5.2 10/06/2022   No results found for: "TSH"   Assessment and Plan:  1. Primary hypertension Inadequate control.  Increase lisinopril to 40 mg once daily.  Hopefully  this is the last dose adjustment though needs to be made.  Consider adding thiazide diuretic if control still not achieved after 1 week at the new dose.  Continue home BP monitoring.  Patient's wrist cuff compared to manual measurement today; seems to be about  10 points higher on the wrist cuff -patient advised. - lisinopril (ZESTRIL) 40 MG tablet; Take 1 tablet (40 mg total) by mouth daily.  Dispense: 90 tablet; Refill: 1  2. Abnormal x-ray Repeat right hip x-ray to assess for interval change of bony islands/sclerotic lesions as noticed on x-ray 4 months ago. - DG Hip Unilat W OR W/O Pelvis 2-3 Views Right   Follow up TBD pending response to antihypertensives.  Hopefully patient can get health insurance soon.  Partially dictated using Animal nutritionist. Any errors are unintentional.  Alvester Morin, PA-C, DMSc, Nutritionist [facility] [phone]

## 2023-02-07 ENCOUNTER — Encounter: Payer: Self-pay | Admitting: Physical Therapy

## 2023-02-07 ENCOUNTER — Ambulatory Visit: Payer: Worker's Compensation | Admitting: Physical Therapy

## 2023-02-07 VITALS — BP 157/90 | HR 76

## 2023-02-07 DIAGNOSIS — R262 Difficulty in walking, not elsewhere classified: Secondary | ICD-10-CM

## 2023-02-07 DIAGNOSIS — R2689 Other abnormalities of gait and mobility: Secondary | ICD-10-CM

## 2023-02-07 DIAGNOSIS — Z96642 Presence of left artificial hip joint: Secondary | ICD-10-CM

## 2023-02-07 DIAGNOSIS — R29898 Other symptoms and signs involving the musculoskeletal system: Secondary | ICD-10-CM

## 2023-02-07 NOTE — Therapy (Signed)
OUTPATIENT PHYSICAL THERAPY TREATMENT    Patient Name: Christian Hughes MRN: [id number] DOB:[birth year], 62 y.o., male Today's Date: 02/07/2023   END OF SESSION:   PT End of Session - 02/07/23 1612     Visit Number 22    Number of Visits 28    Date for PT Re-Evaluation 02/28/23    Authorization Type Workers Compensation    Authorization Time Period 12/18/22-02/22/23    Progress Note Due on Visit 20    PT Start Time 1630    PT Stop Time 1713    PT Time Calculation (min) 43 min    Activity Tolerance Patient tolerated treatment well;Patient limited by pain    Behavior During Therapy [facility] for tasks assessed/performed;Impulsive              Past Medical History:  Diagnosis Date   Engages in vaping    H/O ventral hernia    S/p of repair   Hypertension Early 2010's   Marijuana use    Orthostatic dizziness 10/08/2022   Past Surgical History:  Procedure Laterality Date   HIP ARTHROPLASTY Left 10/07/2022   Procedure: ARTHROPLASTY BIPOLAR HIP (HEMIARTHROPLASTY);  Surgeon: Juanell Fairly, MD;  Location: [facility] ORS;  Service: Orthopedics;  Laterality: Left;   JOINT REPLACEMENT  10/06/2022   Left Hip Replacement due fall while at work   Ventral hernia repair     Patient Active Problem List   Diagnosis Date Noted   Hyperlipidemia 01/10/2023   Prediabetes 01/10/2023   Primary hypertension 01/10/2023   Left ventricular hypertrophy 01/10/2023   Abnormal EKG 10/06/2022   Bone lesion_sclerotic lesions in the right iliac bone 10/06/2022    PCP: Dewaine Oats MD  REFERRING PROVIDER: Juanell Fairly MD   REFERRING DIAG: [phone] (ICD-10-CM) - Presence of left artificial hip joint   THERAPY DIAG:  History of left hip hemiarthroplasty  Weakness of left leg  Balance problems  Difficulty in walking, not elsewhere classified  Rationale for Evaluation and Treatment Rehabilitation  PERTINENT HISTORY: From initial eval: [name] is a 62 y.o. male without significant past medical  history except for occasional vaping amd marijuana use, who presents with fall and left hip pain.  Pt states that he slipped and fell at work at about 11:00 AM.  Denies loss of consciousness.  He injured his left hip, causing left hip pain, which is constant, sharp, severe, nonradiating, aggravated by movement.  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    "My mom took care of me after my hip surgery. I had therapy at home. I returned to my home alone after my first follow up appointment. I have pain in my buttocks, groin and the corner of my buttocks of about 2 to 3/10. I have high pain tolerance. I am unable to walk normal and I feel weak. I began to use this cane on my own after my PT at home stopped.  I can't wait to go to work because, I love to work."   PAIN:  Are you having pain? Yes: Pain location: 2-3/10 Pain description: stabbing Aggravating factors: Moving, walking, WB   Relieving factors: rest, OTC meds, ice    WEIGHT BEARING RESTRICTIONS: Yes:  WBAT   FALLS:  Has patient fallen in last 6 months? Yes. Number of falls once once 3 months ago   LIVING ENVIRONMENT: Lives with: lives alone Lives in: Mobile home  Stairs: Yes: External: 4 steps; can reach both Has following equipment at home: Single point cane   OCCUPATION: Works in the office for [employer]. 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 Pt negotiates some small staircases in town. Pt has historically taken 24-packs of water bottles to his work for customers.    PLOF: Independent   PATIENT GOALS: "I want to return to work without hurting and falling as soon as I can."   NEXT MD VISIT: 11/22/2022    PRECAUTIONS: Posterior hip precautions   L hip hemiarthroplasty following L femoral neck fracture (DOS:  10/07/22)    SUBJECTIVE:                                                                                                                                                                                      SUBJECTIVE STATEMENT:  Pt reports remaining stabbing pain affecting L medial groin region. He uses [facility] for most mobility and does walk without cane oftentimes in his home. He reports getting bruises on forearms from intermittently stumbling into doorway and other items in home. He feels he is not yet ready to perform weightbearing throughout entire day as required for his work duties.    PAIN:  Are you having pain? 1.5-2/10 pain in groin, spasm intermittently affecting L lateral thigh Pain location: L groin, L posterolateral hip    OBJECTIVE: (objective measures completed at initial evaluation unless otherwise dated)   PATIENT SURVEYS:  LEFS 18/80 FOTO 36   COGNITION: Overall cognitive status: Within functional limits for tasks assessed and impulsive.                            SENSATION: WFL   MUSCLE LENGTH: deferred  test 2/2 to pain.    POSTURE: rounded shoulders   PALPATION: Pain in L sacral border, Iliac crest, gluteus medius, TFL, and greater trochanter     LOWER EXTREMITY ROM:    Active ROM Right eval Left eval  Hip flexion WFL 20 deg with pain  Hip extension [facility]    Hip abduction WFL 20 with pain   Hip adduction [facility]    Hip internal rotation [facility]    Hip external rotation [facility]    Knee flexion [facility]  Knee extension [facility]  Ankle dorsiflexion [facility]  Ankle plantarflexion [facility] WFL  Ankle inversion [facility] WFL  Ankle eversion WFL WFL   (Blank rows = not tested)   LOWER EXTREMITY MMT:   MMT Right eval Left  eval Left 12/04/22 Left 12/18/22 Left 01/29/23  Hip flexion WFL 2+ 4* 4- 4  Hip extension WFL 2 3+  3+*  Hip abduction WFl 2 4-  3+*  Hip adduction WFl       Hip internal rotation [facility]       Hip external rotation [facility]       Knee flexion WFL 3- 3+ 4- 4   Knee extension WFl 3 4+ 4* 4*  Ankle dorsiflexion WFL 3+     Ankle plantarflexion WFL 4     Ankle inversion WFL 4     Ankle eversion WFL 3-      (Blank rows = not tested) Pt's L ankle remains inverted 10 degrees since the Surgery.     FUNCTIONAL TESTS (INITIAL EVALUATION) 30 seconds chair stand test: 5 reps completed Timed up and go (TUG): 19secs 10 meter walk test: 12secs= 1,2 m/secs             Balance Test : SL standing unable, Modified tandem 10 secs, Tandem Unable, Rhomberg with EO 5 secs and unable with EC.  GAIT: Distance walked: 30 ft  Assistive device utilized: Single point cane Level of assistance: SBA Comments: Pt unsteady on feet with severe antalgic gait, decreased loading response, absent heel strike and absent toe off gait with uneven stride and decreased speed. Heavy reliance on the [facility]       TODAY'S TREATMENT:                                                                                                                              DATE: 02/07/2023   Vitals taken pre-exercise: Today's Vitals   02/07/23 1638 02/07/23 1639  BP: (!) 160/100 (!) 157/90  Pulse: 72 76  SpO2: 96% 96%     There is no height or weight on file to calculate BMI.   Manual Therapy - for symptom modulation, soft tissue sensitivity and mobility, joint mobility, ROM  STM along L distal iliopsoas, hip flexors, TFL x 5 minutes Gentle L hip PROM up to limits of precautions as tolerated x 1 minute Gentle stretching into L hip abduction; x10 with 2-3 sec holds   *limited tolerance of manual therapy with significant hyperalgesia along distal iliopsoas and pectineus region   Cold pack (unbilled) - for analgesic effect as needed for reduced pain and improved ability to participate in active PT intervention, along L anterior hip in supine with pillow between knees for comfort, x 5 minutes      Therapeutic Exercise - for improved soft tissue flexibility and extensibility as needed for  ROM, improved strength as needed to improve performance of CKC activities/functional movements   Nu-Step L4. Seat at 12 to maintain posterior hip flexion precautions. 5 min, for warm up  -pt attests to feeling headache during warm-up; exercise was stopped and vital signs taken (see above)  Butterfly stretch; 3 x 20 sec  High knees 3x D/B length of //  bars Side steps in // bars: x3 D/B with Green Tband Sit to stand: 1x6 and 1x5; from chair with 8-lb Goblet hold  Toe tapping; 2x10 alternating R/L with 12-inch step, no UE support.     *next visit* Forward step up to Airex pad; 2x10 surgical LE leading    *not today* Sidelying hip abduction; 3x8, no VC's for neutral trunk positioning today. Good carryover from yesterday's session. Bridge 3x12:  Black Tband at distal femurs with good carryover in form/technique.         PATIENT EDUCATION:  Education details: Pt education about findings, rehab process, significance of fall prevention and use of FWW  and anatomy.  Person educated: Patient Education method: Medical illustrator Education comprehension: verbalized understanding   HOME EXERCISE PROGRAM: Access Code: [email or web address] URL: [email or web address] Date: 12/14/2022 Prepared by: Consuela Mimes  Exercises - Sit to Stand  - 1 x daily - 7 x weekly - 2 sets - 10 reps - Supine Bridge with Resistance Band  - 1 x daily - 7 x weekly - 2 sets - 10 reps - Sidelying Hip Abduction  - 1 x daily - 7 x weekly - 2 sets - 10 reps - Standing 3-Way Kick  - 1 x daily - 7 x weekly - 2 sets - 10 reps     ASSESSMENT:   CLINICAL IMPRESSION:  Patent has progressed from relying on FWW and uses [facility] for community-level ambulation; he does not rely significantly on AD in his home. He is modified-independent with transferring and stair/obstacle negotiation. Pt does still have persisting discomfort continuously affecting L groin - and this is exacerbated by weightbearing activity. Hip  pain is persisting longer than expected with patient's hemiarthroplasty being completed on 10/07/22. We have utilized modalities as needed for pain and attempted various manual therapy strategies - pt has limited tolerance with direct pressure onto proximal anterior high/groin region with notable hyperalgesia. Pt is motivated to return to work and is great participant with active PT intervention. 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 Pt is at end of authorization at this time; pt will need to resume PT to finish out POC pending re-authorization via worker's comp. Goals updated 3 visits ago to update referring physician on progress. Patient will benefit from continued skilled therapeutic intervention to address the remaining deficits in hip strength, mobility, and weightbearing tolerance as needed for improved function and ability to perform work duties.      OBJECTIVE IMPAIRMENTS: Abnormal gait, decreased activity tolerance, decreased balance, decreased endurance, decreased knowledge of condition, decreased knowledge of use of DME, decreased mobility, difficulty walking, decreased ROM, decreased strength, and dizziness.    ACTIVITY LIMITATIONS: carrying, lifting, bending, sitting, standing, squatting, sleeping, stairs, transfers, dressing, and locomotion level   PARTICIPATION LIMITATIONS: cleaning, shopping, community activity, occupation, and yard work   PERSONAL FACTORS:  Pt is impulsive and eager to get better so that he can return to work.   are also affecting patient's functional outcome.    REHAB POTENTIAL: Good   CLINICAL DECISION MAKING: Stable/uncomplicated   EVALUATION COMPLEXITY: Low     GOALS: Goals reviewed with patient? Yes   SHORT TERM GOALS: Target date: 12/22/2022 Pt will improve L hip ROM to 10 to 90 degrees without pain  in order to promote normal gait and stability.  Baseline: 20 degree Hip felx and 0  deg Ext 12/04/22: 90 deg hip flex and 5 deg EXT 12/18/22: 90 deg hip flex and 10 deg hip EXT Goal status: ACHIEVED    2.  Pt will be able to ambulate 536ft safely with Cane without fall and pain . Baseline: 30 ft with SBA and heavy reliance on [employer]   12/04/22: Pt uses SPC for primary means of mobility in home and community at this time.  Goal status: ACHIEVED       LONG TERM GOALS: Target date: 01/26/2023   Pt will Improve TUG time to <10secs without SPC to demonstrate decreased fall risk.  Baseline: 19 secs with SPC 12/04/22: 14.6 seconds with SPC 12/18/22: 9.5 sec Goal status: ACHIEVED   2.  Pt will Improve LEFS score to >65/80 to demonstrate improved QoL Baseline: 18/80 12/04/22: 20/80  12/18/22: 22/80 01/29/23: 25/80  Goal status: ON-GOING   3.  Pt will be able to ambulate Independently  with LRAD without LOB and pain in order to return to community level mobility safely  Baseline: With [facility] and FWW.   12/04/22: ModI ambulation with SPC/LRAD with mild pain at longer distances, no LOB 12/18/22: Amb IND with SPC with no LOB Goal status: ACHIEVED    4.  Pt will score >50 on FOTO Baseline: 36/56 12/04/22: 35/56 12/18/22: 37/56 01/29/23: 43/56 Goal status: IN PROGRESS   5.  Pt will demonstrate 4/5 L hip strength without pain to improve stability and safety with all functional mobility in order to return to work.  Baseline: 2/5 grossly    12/04/22: 4-/5 grossly 12/18/22: 4-/5 grossly 01/29/23: 4/5 for hip flexors/quads/HS, 3+/5 for hip ABD and EXT.  Goal status: IN PROGRESS          PLAN:   PT FREQUENCY: 2x/week   PT DURATION: 4 weeks   PLANNED INTERVENTIONS: Therapeutic exercises, Therapeutic activity, Neuromuscular re-education, Balance training, Gait training, Patient/Family education, Self Care, and Joint mobilization   PLAN FOR NEXT SESSION: Progressive gait and stepping activity with gradual AD wean,  progressive LE strengthening. Manual therapy and/or modalities as needed for anterior hip and/or posterolateral hip pain.  F/u on blood pressure control and take vital signs at arrival.    Consuela Mimes, PT, DPT [phone]  Physical Therapist- [facility]  02/07/2023, 4:38 PM

## 2023-02-10 ENCOUNTER — Encounter: Payer: Self-pay | Admitting: Physical Therapy

## 2023-02-12 ENCOUNTER — Encounter: Payer: Self-pay | Admitting: Physical Therapy

## 2023-02-12 ENCOUNTER — Encounter: Payer: Self-pay | Admitting: Physician Assistant

## 2023-02-12 ENCOUNTER — Telehealth: Payer: Self-pay | Admitting: *Deleted

## 2023-02-12 DIAGNOSIS — M8580 Other specified disorders of bone density and structure, unspecified site: Secondary | ICD-10-CM | POA: Insufficient documentation

## 2023-02-12 NOTE — Telephone Encounter (Signed)
Patient requesting to review results again from D. Waddell, PA from 02/12/23 regarding hip xray. Patient reports he is already taking a calcium supplement and would like to know if he should just take vit D supplement. Please advise . Patient will contact PCP regarding insurance issues when he has more information.

## 2023-02-13 NOTE — Telephone Encounter (Signed)
Pt called back. Shared note from provider. No questions at this time.

## 2023-02-13 NOTE — Telephone Encounter (Signed)
Called pt left VM to call back. Pt can take 1000 mg of calcium and 600 UI of Vit D daily.  KP

## 2023-02-14 ENCOUNTER — Encounter: Payer: Self-pay | Admitting: Physical Therapy

## 2023-02-19 ENCOUNTER — Encounter: Payer: Self-pay | Admitting: Physical Therapy

## 2023-02-21 ENCOUNTER — Encounter: Payer: Self-pay | Admitting: Physical Therapy

## 2023-02-26 ENCOUNTER — Encounter: Payer: Self-pay | Admitting: Physical Therapy

## 2023-02-26 ENCOUNTER — Ambulatory Visit: Payer: Worker's Compensation | Admitting: Physical Therapy

## 2023-02-26 VITALS — BP 165/93 | HR 73

## 2023-02-26 DIAGNOSIS — Z96642 Presence of left artificial hip joint: Secondary | ICD-10-CM

## 2023-02-26 DIAGNOSIS — R29898 Other symptoms and signs involving the musculoskeletal system: Secondary | ICD-10-CM

## 2023-02-26 DIAGNOSIS — R262 Difficulty in walking, not elsewhere classified: Secondary | ICD-10-CM

## 2023-02-26 DIAGNOSIS — R2689 Other abnormalities of gait and mobility: Secondary | ICD-10-CM

## 2023-02-26 NOTE — Therapy (Unsigned)
OUTPATIENT PHYSICAL THERAPY TREATMENT    Patient Name: Christian Hughes MRN: [id number] DOB:06-13-1961, 63 y.o., male Today's Date: 02/26/2023   END OF SESSION:   PT End of Session - 02/26/23 1413     Visit Number 23    Number of Visits 28    Date for PT Re-Evaluation 02/28/23    Authorization Type Workers Compensation    Authorization Time Period 12/18/22-02/22/23    Progress Note Due on Visit 20    PT Start Time 1414    PT Stop Time 1456    PT Time Calculation (min) 42 min    Activity Tolerance Patient tolerated treatment well;Patient limited by pain    Behavior During Therapy [facility] for tasks assessed/performed;Impulsive               Past Medical History:  Diagnosis Date   Engages in vaping    H/O ventral hernia    S/p of repair   Hypertension Early 2010's   Marijuana use    Orthostatic dizziness 10/08/2022   Past Surgical History:  Procedure Laterality Date   HIP ARTHROPLASTY Left 10/07/2022   Procedure: ARTHROPLASTY BIPOLAR HIP (HEMIARTHROPLASTY);  Surgeon: Christian Fairly, MD;  Location: [facility] ORS;  Service: Orthopedics;  Laterality: Left;   JOINT REPLACEMENT  10/06/2022   Left Hip Replacement due fall while at work   Ventral hernia repair     Patient Active Problem List   Diagnosis Date Noted   Osteopenia determined by x-ray 02/12/2023   Hyperlipidemia 01/10/2023   Prediabetes 01/10/2023   Primary hypertension 01/10/2023   Left ventricular hypertrophy 01/10/2023   Abnormal EKG 10/06/2022   Bone lesion_sclerotic lesions in the right iliac bone 10/06/2022    PCP: Christian Oats MD  REFERRING PROVIDER: Juanell Fairly MD   REFERRING DIAG: [phone] (ICD-10-CM) - Presence of left artificial hip joint   THERAPY DIAG:  History of left hip hemiarthroplasty  Weakness of left leg  Balance problems  Difficulty in walking, not elsewhere classified  Rationale for Evaluation and Treatment Rehabilitation  PERTINENT HISTORY: From initial eval: [name] is  a 62 y.o. male without significant past medical history except for occasional vaping amd marijuana use, who presents with fall and left hip pain.  Pt states that he slipped and fell at work at about 11:00 AM.  Denies loss of consciousness.  He injured his left hip, causing left hip pain, which is constant, sharp, severe, nonradiating, aggravated by movement.  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    "My mom took care of me after my hip surgery. I had therapy at home. I returned to my home alone after my first follow up appointment. I have pain in my buttocks, groin and the corner of my buttocks of about 2 to 3/10. I have high pain tolerance. I am unable to walk normal and I feel weak. I began to use this cane on my own after my PT at home stopped.  I can't wait to go to work because, I love to work."   PAIN:  Are you having pain? Yes: Pain location: 2-3/10 Pain description: stabbing Aggravating factors: Moving, walking, WB   Relieving factors: rest, OTC meds, ice    WEIGHT BEARING RESTRICTIONS: Yes:  WBAT   FALLS:  Has patient fallen in last 6 months? Yes. 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 Pt negotiates some small staircases in town. Pt has historically taken 24-packs of water bottles to his work for customers.    PLOF: Independent   PATIENT GOALS: "I want to return to work without hurting and falling as soon as I can."   NEXT MD VISIT: 11/22/2022    PRECAUTIONS: Posterior hip  precautions      OBJECTIVE: (objective measures completed at initial evaluation unless otherwise dated)   PATIENT SURVEYS:  LEFS 18/80 FOTO 36   COGNITION: Overall cognitive status: Within functional limits for tasks assessed and impulsive.                            SENSATION: WFL   MUSCLE LENGTH: deferred  test 2/2 to pain.    POSTURE: rounded shoulders   PALPATION: Pain in L sacral border, Iliac crest, gluteus medius, TFL, and greater trochanter     LOWER EXTREMITY ROM:    Active ROM Right eval Left eval  Hip flexion WFL 20 deg with pain  Hip extension [facility]    Hip abduction WFL 20 with pain   Hip adduction [facility]    Hip internal rotation [facility]    Hip external rotation [facility]    Knee flexion [facility]  Knee extension [facility]  Ankle dorsiflexion [facility]  Ankle plantarflexion [facility]  Ankle inversion [facility]  Ankle eversion WFL WFL   (Blank rows = not tested)   LOWER EXTREMITY MMT:   MMT Right eval Left eval Left 12/04/22 Left 12/18/22 Left 01/29/23  Hip flexion WFL 2+ 4* 4- 4  Hip extension WFL 2 3+  3+*  Hip abduction WFl 2 4-  3+*  Hip adduction WFl       Hip internal rotation [facility]       Hip external rotation [facility]       Knee flexion WFL 3- 3+ 4- 4  Knee extension WFl 3 4+ 4* 4*  Ankle dorsiflexion WFL 3+     Ankle plantarflexion WFL 4     Ankle inversion WFL 4     Ankle eversion WFL 3-      (Blank rows = not tested) Pt's L ankle remains inverted 10 degrees since the Surgery.     FUNCTIONAL TESTS (INITIAL EVALUATION) 30 seconds chair stand test: 5 reps completed Timed up and go (TUG): 19secs 10 meter walk test: 12secs= 1,2 m/secs             Balance Test : SL standing unable, Modified tandem 10 secs, Tandem Unable, Rhomberg with EO 5 secs and unable with EC.  GAIT: Distance walked: 30 ft  Assistive device utilized: Single point cane Level of assistance: SBA Comments: Pt unsteady on feet with severe antalgic gait, decreased loading response, absent heel  strike and absent toe off gait with uneven stride and decreased speed. Heavy reliance on the [facility]       TODAY'S TREATMENT:  DATE: 02/26/2023    L hip hemiarthroplasty following L femoral neck fracture (DOS: 10/07/22)   SUBJECTIVE STATEMENT:  Pt reports remaining stabbing pain affecting L medial groin region. He uses [facility] for most mobility and does walk without cane oftentimes in his home. He reports getting bruises on forearms from intermittently stumbling into doorway and other items in home. He feels he is not yet ready to perform weightbearing throughout entire day as required for his work duties.    Vitals taken pre-exercise: Today's Vitals   02/26/23 1418  BP: (!) 165/93  Pulse: 73      There is no height or weight on file to calculate BMI.    Therapeutic Exercise - for improved soft tissue flexibility and extensibility as needed for ROM, improved strength as needed to improve performance of [facility] activities/functional movements   Nu-Step L3. Seat at 11 to maintain posterior hip flexion precautions. 5 min, for warm up  -pt attests to feeling headache during warm-up; exercise was stopped and vital signs taken (see above)   Side steps in // bars: x3 D/B with Green Tband Sit to stand: 1x6 and 1x5; from chair with 8-lb Goblet hold  Toe tapping; 2x10 alternating R/L with 12-inch step, no UE support.  Forward step up to Airex pad; 2x10 surgical LE leading  Unipedal stance; 2 x 15 sec  Alternating hip flexion with Tband; 2x10 with Red Tband  -added to HEP   PATIENT EDUCATION: HEP update and review.   *not today* Sidelying hip abduction; 3x8, no VC's for neutral trunk positioning today. Good carryover from yesterday's session. Bridge 3x12:  Black Tband at distal femurs with good carryover in form/technique.  Butterfly stretch; 3 x 20 sec        PATIENT EDUCATION:  Education details: see above for patient education details Person educated: Patient Education method: Explanation and Demonstration Education comprehension: verbalized understanding   HOME EXERCISE PROGRAM: Access Code: [email or web address] URL: [email or web address] Date: 12/14/2022 Prepared by: Consuela Mimes  Exercises - Sit to Stand  - 1 x daily - 7 x weekly - 2 sets - 10 reps - Supine Bridge with Resistance Band  - 1 x daily - 7 x weekly - 2 sets - 10 reps - Sidelying Hip Abduction  - 1 x daily - 7 x weekly - 2 sets - 10 reps - Standing 3-Way Kick  - 1 x daily - 7 x weekly - 2 sets - 10 reps     ASSESSMENT:   CLINICAL IMPRESSION:  Patent has progressed from relying on FWW and uses [facility] for community-level ambulation; he does not rely significantly on AD in his home. He is modified-independent with transferring and stair/obstacle negotiation. Pt does still have persisting discomfort continuously affecting L groin - and this is exacerbated by weightbearing activity. Hip pain is persisting longer than expected with patient's hemiarthroplasty being completed on 10/07/22. We have utilized modalities as needed for pain and attempted various manual therapy strategies - pt has limited tolerance with direct pressure onto proximal anterior high/groin region with notable hyperalgesia. Pt is motivated to return to work and is great participant with active PT intervention. 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 Pt is at end of authorization at this time; pt will need to resume PT to finish out POC pending re-authorization via worker's comp. Goals updated 3 visits ago to  update referring physician on progress. Patient will benefit from continued skilled therapeutic intervention to address the remaining deficits in hip  strength, mobility, and weightbearing tolerance as needed for improved function and ability to perform work duties.      OBJECTIVE IMPAIRMENTS: Abnormal gait, decreased activity tolerance, decreased balance, decreased endurance, decreased knowledge of condition, decreased knowledge of use of DME, decreased mobility, difficulty walking, decreased ROM, decreased strength, and dizziness.    ACTIVITY LIMITATIONS: carrying, lifting, bending, sitting, standing, squatting, sleeping, stairs, transfers, dressing, and locomotion level   PARTICIPATION LIMITATIONS: cleaning, shopping, community activity, occupation, and yard work   PERSONAL FACTORS:  Pt is impulsive and eager to get better so that he can return to work.   are also affecting patient's functional outcome.    REHAB POTENTIAL: Good   CLINICAL DECISION MAKING: Stable/uncomplicated   EVALUATION COMPLEXITY: Low     GOALS: Goals reviewed with patient? Yes   SHORT TERM GOALS: Target date: 12/22/2022 Pt will improve L hip ROM to 10 to 90 degrees without pain in order to promote normal gait and stability.  Baseline: 20 degree Hip felx and 0 deg Ext 12/04/22: 90 deg hip flex and 5 deg EXT 12/18/22: 90 deg hip flex and 10 deg hip EXT Goal status: ACHIEVED    2.  Pt will be able to ambulate 561ft safely with Cane without fall and pain . Baseline: 30 ft with SBA and heavy reliance on [facility].   12/04/22: Pt uses SPC for primary means of mobility in home and community at this time.  Goal status: ACHIEVED       LONG TERM GOALS: Target date: 01/26/2023   Pt will Improve TUG time to <10secs without SPC to demonstrate decreased fall risk.  Baseline: 19 secs with SPC 12/04/22: 14.6 seconds with SPC 12/18/22: 9.5 sec Goal status: ACHIEVED   2.  Pt will Improve LEFS score to >65/80 to demonstrate improved QoL Baseline: 18/80 12/04/22: 20/80  12/18/22: 22/80 01/29/23: 25/80  Goal status: ON-GOING   3.  Pt will be able to ambulate Independently  with  LRAD without LOB and pain in order to return to community level mobility safely  Baseline: With [facility] and FWW.   12/04/22: ModI ambulation with SPC/LRAD with mild pain at longer distances, no LOB 12/18/22: Amb IND with SPC with no LOB Goal status: ACHIEVED    4.  Pt will score >50 on FOTO Baseline: 36/56 12/04/22: 35/56 12/18/22: 37/56 01/29/23: 43/56 Goal status: IN PROGRESS   5.  Pt will demonstrate 4/5 L hip strength without pain to improve stability and safety with all functional mobility in order to return to work.  Baseline: 2/5 grossly    12/04/22: 4-/5 grossly 12/18/22: 4-/5 grossly 01/29/23: 4/5 for hip flexors/quads/HS, 3+/5 for hip ABD and EXT.  Goal status: IN PROGRESS          PLAN:   PT FREQUENCY: 2x/week   PT DURATION: 4 weeks   PLANNED INTERVENTIONS: Therapeutic exercises, Therapeutic activity, Neuromuscular re-education, Balance training, Gait training, Patient/Family education, Self Care, and Joint mobilization   PLAN FOR NEXT SESSION: Progressive gait and stepping activity with gradual AD wean, progressive LE strengthening. Manual therapy and/or modalities as needed for anterior hip and/or posterolateral hip pain.  F/u on blood pressure control and take vital signs at arrival.    Consuela Mimes, PT, DPT [phone]  Physical Therapist- [facility]  02/26/2023, 2:21 PM

## 2023-02-28 ENCOUNTER — Encounter: Payer: Self-pay | Admitting: Physical Therapy

## 2023-02-28 ENCOUNTER — Ambulatory Visit: Payer: Worker's Compensation | Admitting: Physical Therapy

## 2023-02-28 VITALS — BP 145/94 | HR 80

## 2023-02-28 DIAGNOSIS — R262 Difficulty in walking, not elsewhere classified: Secondary | ICD-10-CM

## 2023-02-28 DIAGNOSIS — R2689 Other abnormalities of gait and mobility: Secondary | ICD-10-CM

## 2023-02-28 DIAGNOSIS — Z96642 Presence of left artificial hip joint: Secondary | ICD-10-CM

## 2023-02-28 DIAGNOSIS — R29898 Other symptoms and signs involving the musculoskeletal system: Secondary | ICD-10-CM

## 2023-02-28 NOTE — Therapy (Signed)
OUTPATIENT PHYSICAL THERAPY TREATMENT    Patient Name: Christian Hughes MRN: [id number] DOB:02/19/1961, 62 y.o., male Today's Date: 02/28/2023   END OF SESSION:   PT End of Session - 02/28/23 1412     Visit Number 24    Number of Visits 28    Date for PT Re-Evaluation 02/28/23    Authorization Type Workers Compensation    Authorization Time Period 12/18/22-02/22/23    Progress Note Due on Visit 20    PT Start Time 1419    PT Stop Time 1501    PT Time Calculation (min) 42 min    Activity Tolerance Patient tolerated treatment well;Patient limited by pain    Behavior During Therapy [facility] for tasks assessed/performed;Impulsive             Past Medical History:  Diagnosis Date   Engages in vaping    H/O ventral hernia    S/p of repair   Hypertension Early 2010's   Marijuana use    Orthostatic dizziness 10/08/2022   Past Surgical History:  Procedure Laterality Date   HIP ARTHROPLASTY Left 10/07/2022   Procedure: ARTHROPLASTY BIPOLAR HIP (HEMIARTHROPLASTY);  Surgeon: Juanell Fairly, MD;  Location: [facility] ORS;  Service: Orthopedics;  Laterality: Left;   JOINT REPLACEMENT  10/06/2022   Left Hip Replacement due fall while at work   Ventral hernia repair     Patient Active Problem List   Diagnosis Date Noted   Osteopenia determined by x-ray 02/12/2023   Hyperlipidemia 01/10/2023   Prediabetes 01/10/2023   Primary hypertension 01/10/2023   Left ventricular hypertrophy 01/10/2023   Abnormal EKG 10/06/2022   Bone lesion_sclerotic lesions in the right iliac bone 10/06/2022    PCP: Dewaine Oats MD  REFERRING PROVIDER: Juanell Fairly MD   REFERRING DIAG: [phone] (ICD-10-CM) - Presence of left artificial hip joint   THERAPY DIAG:  History of left hip hemiarthroplasty  Weakness of left leg  Balance problems  Difficulty in walking, not elsewhere classified  Rationale for Evaluation and Treatment Rehabilitation  PERTINENT HISTORY: From initial eval: Christian Hughes is a 62  y.o. male without significant past medical history except for occasional vaping amd marijuana use, who presents with fall and left hip pain.  Pt states that he slipped and fell at work at about 11:00 AM.  Denies loss of consciousness.  He injured his left hip, causing left hip pain, which is constant, sharp, severe, nonradiating, aggravated by movement.  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    "My mom took care of me after my hip surgery. I had therapy at home. I returned to my home alone after my first follow up appointment. I have pain in my buttocks, groin and the corner of my buttocks of about 2 to 3/10. I have high pain tolerance. I am unable to walk normal and I feel weak. I began to use this cane on my own after my PT at home stopped.  I can't wait to go to work because, I love to work."   PAIN:  Are you having pain? Yes: Pain location: 2-3/10 Pain description: stabbing Aggravating factors: Moving, walking, WB   Relieving factors: rest, OTC meds, ice    WEIGHT BEARING RESTRICTIONS: Yes:  WBAT   FALLS:  Has patient fallen in last 6 months? Yes. Number of falls once once 3 months ago   LIVING ENVIRONMENT: Lives with:  lives alone Lives in: Mobile home Stairs: Yes: External: 4 steps; can reach both Has following equipment at home: Single point cane   OCCUPATION: Works in the office for [employer]. 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 Pt negotiates some small staircases in town. Pt has historically taken 24-packs of water bottles to his work for customers.    PLOF: Independent   PATIENT GOALS: "I want to return to work without hurting and falling as soon as I can."   NEXT MD VISIT: 11/22/2022    PRECAUTIONS: Posterior hip precautions      OBJECTIVE:  (objective measures completed at initial evaluation unless otherwise dated)   PATIENT SURVEYS:  LEFS 18/80 FOTO 36        PALPATION: Pain in L sacral border, Iliac crest, gluteus medius, TFL, and greater trochanter     LOWER EXTREMITY ROM:    Active ROM Right eval Left eval  Hip flexion WFL 20 deg with pain  Hip extension [facility]    Hip abduction WFL 20 with pain   Hip adduction [facility]    Hip internal rotation [facility]    Hip external rotation [facility]    Knee flexion [facility]  Knee extension [facility]  Ankle dorsiflexion [facility]  Ankle plantarflexion [facility]  Ankle inversion [facility]  Ankle eversion WFL WFL   (Blank rows = not tested)   LOWER EXTREMITY MMT:   MMT Right eval Left eval Left 12/04/22 Left 12/18/22 Left 01/29/23  Hip flexion WFL 2+ 4* 4- 4  Hip extension WFL 2 3+  3+*  Hip abduction WFl 2 4-  3+*  Hip adduction WFl       Hip internal rotation [facility]       Hip external rotation [facility]       Knee flexion WFL 3- 3+ 4- 4  Knee extension WFl 3 4+ 4* 4*  Ankle dorsiflexion WFL 3+     Ankle plantarflexion WFL 4     Ankle inversion WFL 4     Ankle eversion WFL 3-      (Blank rows = not tested) Pt's L ankle remains inverted 10 degrees since the Surgery.     FUNCTIONAL TESTS (INITIAL EVALUATION) 30 seconds chair stand test: 5 reps completed Timed up and go (TUG): 19secs 10 meter walk test: 12secs= 1,2 m/secs             Balance Test : SL standing unable, Modified tandem 10 secs, Tandem Unable, Rhomberg with EO 5 secs and unable with EC.  GAIT: Distance walked: 30 ft  Assistive device utilized: Single point cane Level of assistance: SBA Comments: Pt unsteady on feet with severe antalgic gait, decreased loading response, absent heel strike and absent toe off gait with uneven stride and decreased speed. Heavy reliance on the [facility]       TODAY'S TREATMENT:  DATE: 02/28/2023    L hip hemiarthroplasty following L femoral neck fracture (DOS: 10/07/22)   SUBJECTIVE STATEMENT:  Pt reports remaining pain affecting L medial groin region, intermittent "tweaking" per patient. Patient reports 2/10 pain affecting deep medial groin region.    Vitals taken pre-exercise: Today's Vitals   02/28/23 1421 02/28/23 1432  BP: (!) 133/93 (!) 145/94  Pulse: 89 80  SpO2: 96%     There is no height or weight on file to calculate BMI.    Therapeutic Exercise - for improved soft tissue flexibility and extensibility as needed for ROM, improved strength as needed to improve performance of CKC activities/functional movements   Nu-Step L4. Seat at 11 to maintain posterior hip flexion precautions. 5 min, for warm up  -attempted L5, pt reports increasing groin pain, resistance lowered back to L4  Side steps in // bars: x3 D/B with Blue Tband Sit to stand: 1x6 and 1x4; from chair with 8-lb Goblet hold  Toe tapping; 2x10 alternating R/L with 12-inch step, no UE support; stance on Airex pad Standing march with 5-lb ankle weights Forward step up to Airex pad; 2x10 surgical LE leading    PATIENT EDUCATION: HEP update and review. Reviewed guidelines for safe exercise with hypertension.    *not today* Unipedal stance; 2 x 15 sec Alternating hip flexion with Tband; 2x10 with Red Tband  -added to HEP Sidelying hip abduction; 3x8, no VC's for neutral trunk positioning today. Good carryover from yesterday's session. Bridge 3x12:  Black Tband at distal femurs with good carryover in form/technique.  Butterfly stretch; 3 x 20 sec      Cold pack (unbilled) - for anti-inflammatory and analgesic effect as needed for reduced pain and improved ability to participate in active PT intervention, along L anterior in supine with bolster under knees for relaxation, x 5 minutes    PATIENT EDUCATION:  Education details: see above for patient education details Person  educated: Patient Education method: Explanation and Demonstration Education comprehension: verbalized understanding   HOME EXERCISE PROGRAM: Access Code: [email or web address] URL: [email or web address] Date: 12/14/2022 Prepared by: Consuela Mimes  Exercises - Sit to Stand  - 1 x daily - 7 x weekly - 2 sets - 10 reps - Supine Bridge with Resistance Band  - 1 x daily - 7 x weekly - 2 sets - 10 reps - Sidelying Hip Abduction  - 1 x daily - 7 x weekly - 2 sets - 10 reps - Standing 3-Way Kick  - 1 x daily - 7 x weekly - 2 sets - 10 reps     ASSESSMENT:   CLINICAL IMPRESSION:  Patent has relatively low-level groin pain, though it does limit tolerance of high volume of weightbearing/closed-chain exercise. He demonstrates remaining antalgic pattern, though gait without reliance on AD has markedly improved. He negotiates gym safely without assistive device. Pt is on sedentary duty at this time and is not completing long-distance walking at work. Patient will benefit from continued skilled therapeutic intervention to address the remaining deficits in hip strength, mobility, and weightbearing tolerance as needed for improved function and ability to perform work duties.      OBJECTIVE IMPAIRMENTS: Abnormal gait, decreased activity tolerance, decreased balance, decreased endurance, decreased knowledge of condition, decreased knowledge of use of DME, decreased mobility, difficulty walking, decreased ROM, decreased strength, and dizziness.    ACTIVITY LIMITATIONS: carrying, lifting, bending, sitting, standing, squatting, sleeping, stairs, transfers, dressing, and locomotion level   PARTICIPATION LIMITATIONS: cleaning, shopping, community activity, occupation, and yard work  PERSONAL FACTORS:  Pt is impulsive and eager to get better so that he can return to work.   are also affecting patient's functional outcome.    REHAB POTENTIAL: Good   CLINICAL DECISION MAKING: Stable/uncomplicated    EVALUATION COMPLEXITY: Low     GOALS: Goals reviewed with patient? Yes   SHORT TERM GOALS: Target date: 12/22/2022 Pt will improve L hip ROM to 10 to 90 degrees without pain in order to promote normal gait and stability.  Baseline: 20 degree Hip felx and 0 deg Ext 12/04/22: 90 deg hip flex and 5 deg EXT 12/18/22: 90 deg hip flex and 10 deg hip EXT Goal status: ACHIEVED    2.  Pt will be able to ambulate 589ft safely with Cane without fall and pain . Baseline: 30 ft with SBA and heavy reliance on [facility].   12/04/22: Pt uses SPC for primary means of mobility in home and community at this time.  Goal status: ACHIEVED       LONG TERM GOALS: Target date: 01/26/2023   Pt will Improve TUG time to <10secs without SPC to demonstrate decreased fall risk.  Baseline: 19 secs with SPC 12/04/22: 14.6 seconds with SPC 12/18/22: 9.5 sec Goal status: ACHIEVED   2.  Pt will Improve LEFS score to >65/80 to demonstrate improved QoL Baseline: 18/80 12/04/22: 20/80  12/18/22: 22/80 01/29/23: 25/80  Goal status: ON-GOING   3.  Pt will be able to ambulate Independently  with LRAD without LOB and pain in order to return to community level mobility safely  Baseline: With [facility] and [facility].   12/04/22: ModI ambulation with SPC/LRAD with mild pain at longer distances, no LOB 12/18/22: Amb IND with SPC with no LOB Goal status: ACHIEVED    4.  Pt will score >50 on FOTO Baseline: 36/56 12/04/22: 35/56 12/18/22: 37/56 01/29/23: 43/56 Goal status: IN PROGRESS   5.  Pt will demonstrate 4/5 L hip strength without pain to improve stability and safety with all functional mobility in order to return to work.  Baseline: 2/5 grossly    12/04/22: 4-/5 grossly 12/18/22: 4-/5 grossly 01/29/23: 4/5 for hip flexors/quads/HS, 3+/5 for hip ABD and EXT.  Goal status: IN PROGRESS          PLAN:   PT FREQUENCY: 2x/week   PT DURATION: 4 weeks   PLANNED INTERVENTIONS: Therapeutic exercises, Therapeutic activity, Neuromuscular  re-education, Balance training, Gait training, Patient/Family education, Self Care, and Joint mobilization   PLAN FOR NEXT SESSION: Progressive gait and stepping activity with gradual AD wean, progressive LE strengthening. Manual therapy and/or modalities as needed for anterior hip and/or posterolateral hip pain.  F/u on blood pressure control and take vital signs at arrival.    Consuela Mimes, PT, DPT [phone]  Physical Therapist- [facility]  02/28/2023, 3:28 PM

## 2023-03-05 ENCOUNTER — Encounter: Payer: Self-pay | Admitting: Physical Therapy

## 2023-03-06 ENCOUNTER — Encounter: Payer: Self-pay | Admitting: Physical Therapy

## 2023-03-06 ENCOUNTER — Ambulatory Visit: Payer: Worker's Compensation | Attending: Orthopedic Surgery | Admitting: Physical Therapy

## 2023-03-06 VITALS — BP 138/86 | HR 69

## 2023-03-06 DIAGNOSIS — R29898 Other symptoms and signs involving the musculoskeletal system: Secondary | ICD-10-CM | POA: Diagnosis present

## 2023-03-06 DIAGNOSIS — R262 Difficulty in walking, not elsewhere classified: Secondary | ICD-10-CM | POA: Diagnosis present

## 2023-03-06 DIAGNOSIS — R2689 Other abnormalities of gait and mobility: Secondary | ICD-10-CM | POA: Diagnosis present

## 2023-03-06 DIAGNOSIS — Z96642 Presence of left artificial hip joint: Secondary | ICD-10-CM | POA: Insufficient documentation

## 2023-03-06 NOTE — Therapy (Unsigned)
OUTPATIENT PHYSICAL THERAPY TREATMENT    Patient Name: Christian Hughes MRN: [id number] DOB:15-Aug-1960, 62 y.o., male Today's Date: 03/06/2023   END OF SESSION:   PT End of Session - 03/08/23 0915     Visit Number 25    Number of Visits 28    Date for PT Re-Evaluation 02/28/23    Authorization Type Workers Compensation    Authorization Time Period 12/18/22-02/22/23    Progress Note Due on Visit 20    PT Start Time 1515    PT Stop Time 1557    PT Time Calculation (min) 42 min    Activity Tolerance Patient tolerated treatment well;Patient limited by pain    Behavior During Therapy [facility] for tasks assessed/performed;Impulsive               Past Medical History:  Diagnosis Date   Engages in vaping    H/O ventral hernia    S/p of repair   Hypertension Early 2010's   Marijuana use    Orthostatic dizziness 10/08/2022   Past Surgical History:  Procedure Laterality Date   HIP ARTHROPLASTY Left 10/07/2022   Procedure: ARTHROPLASTY BIPOLAR HIP (HEMIARTHROPLASTY);  Surgeon: Juanell Fairly, MD;  Location: [facility] ORS;  Service: Orthopedics;  Laterality: Left;   JOINT REPLACEMENT  10/06/2022   Left Hip Replacement due fall while at work   Ventral hernia repair     Patient Active Problem List   Diagnosis Date Noted   Osteopenia determined by x-ray 02/12/2023   Hyperlipidemia 01/10/2023   Prediabetes 01/10/2023   Primary hypertension 01/10/2023   Left ventricular hypertrophy 01/10/2023   Abnormal EKG 10/06/2022   Bone lesion_sclerotic lesions in the right iliac bone 10/06/2022    PCP: Dewaine Oats MD  REFERRING PROVIDER: Juanell Fairly MD   REFERRING DIAG: [phone] (ICD-10-CM) - Presence of left artificial hip joint   THERAPY DIAG:  History of left hip hemiarthroplasty  Weakness of left leg  Balance problems  Difficulty in walking, not elsewhere classified  Rationale for Evaluation and Treatment Rehabilitation  PERTINENT HISTORY: From initial eval: Christian Hughes is a  62 y.o. male without significant past medical history except for occasional vaping amd marijuana use, who presents with fall and left hip pain.  Pt states that he slipped and fell at work at about 11:00 AM.  Denies loss of consciousness.  He injured his left hip, causing left hip pain, which is constant, sharp, severe, nonradiating, aggravated by movement.  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    "My mom took care of me after my hip surgery. I had therapy at home. I returned to my home alone after my first follow up appointment. I have pain in my buttocks, groin and the corner of my buttocks of about 2 to 3/10. I have high pain tolerance. I am unable to walk normal and I feel weak. I began to use this cane on my own after my PT at home stopped.  I can't wait to go to work because, I love to work."   PAIN:  Are you having pain? Yes: Pain location: 2-3/10 Pain description: stabbing Aggravating factors: Moving, walking, WB   Relieving factors: rest, OTC meds, ice    WEIGHT BEARING RESTRICTIONS: Yes:  WBAT   FALLS:  Has patient fallen in last 6 months? Yes. 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 Pt negotiates some small staircases in town. Pt has historically taken 24-packs of water bottles to his work for customers.    PLOF: Independent   PATIENT GOALS: "I want to return to work without hurting and falling as soon as I can."   NEXT MD VISIT: 11/22/2022    PRECAUTIONS: Posterior hip precautions      OBJECTIVE:  (objective measures completed at initial evaluation unless otherwise dated)   PATIENT SURVEYS:  LEFS 18/80 FOTO 36        PALPATION: Pain in L sacral border, Iliac crest, gluteus medius, TFL, and greater trochanter     LOWER EXTREMITY ROM:    Active ROM Right eval Left eval  Hip flexion WFL 20 deg with pain  Hip extension [facility]    Hip abduction WFL 20 with pain   Hip adduction [facility]    Hip internal rotation [facility]    Hip external rotation [facility]    Knee flexion [facility]  Knee extension [facility]  Ankle dorsiflexion [facility]  Ankle plantarflexion [facility]  Ankle inversion [facility]  Ankle eversion WFL WFL   (Blank rows = not tested)   LOWER EXTREMITY MMT:   MMT Right eval Left eval Left 12/04/22 Left 12/18/22 Left 01/29/23  Hip flexion WFL 2+ 4* 4- 4  Hip extension WFL 2 3+  3+*  Hip abduction WFl 2 4-  3+*  Hip adduction WFl       Hip internal rotation [facility]       Hip external rotation [facility]       Knee flexion WFL 3- 3+ 4- 4  Knee extension WFl 3 4+ 4* 4*  Ankle dorsiflexion WFL 3+     Ankle plantarflexion WFL 4     Ankle inversion WFL 4     Ankle eversion WFL 3-      (Blank rows = not tested) Pt's L ankle remains inverted 10 degrees since the Surgery.     FUNCTIONAL TESTS (INITIAL EVALUATION) 30 seconds chair stand test: 5 reps completed Timed up and go (TUG): 19secs 10 meter walk test: 12secs= 1,2 m/secs             Balance Test : SL standing unable, Modified tandem 10 secs, Tandem Unable, Rhomberg with EO 5 secs and unable with EC.  GAIT: Distance walked: 30 ft  Assistive device utilized: Single point cane Level of assistance: SBA Comments: Pt unsteady on feet with severe antalgic gait, decreased loading response, absent heel strike and absent toe off gait with uneven stride and decreased speed. Heavy reliance on the [facility]       TODAY'S TREATMENT:  DATE: 03/08/2023    L hip hemiarthroplasty following L femoral neck fracture (DOS: 10/07/22)   SUBJECTIVE STATEMENT:  Pt reports more "tweaking" in L groin today with inclement weather. He reports more antalgic gait pattern today. 5/10 pain at arrival. He reports compliance with updated HEP.    Vitals taken pre-exercise: Today's Vitals   03/06/23 1522  BP: 138/86  Pulse: 69     There is no height or weight on file to calculate BMI.     Therapeutic Exercise - for improved soft tissue flexibility and extensibility as needed for ROM, improved strength as needed to improve performance of CKC activities/functional movements   Nu-Step L4. Seat at 11 to maintain posterior hip flexion precautions. 5 min, for warm up  -reduced to L3 for last 2 minutes    Cold pack (unbilled) - for anti-inflammatory and analgesic effect as needed for reduced pain and improved ability to participate in active PT intervention, along L anterior in supine with bolster under knees for relaxation, x 5 minutes    Glute bridge with 10-lb ankle weight on pelvis; 2x8 Sidelying hip abduction; 2x10; VC/TC for leg positioning to limit hip flexion Side steps in // bars: x3 D/B with Blue Tband Toe tapping; 2x10 alternating R/L with 12-inch step, no UE support; stance on Airex pad Standing march with 5-lb ankle weights; 2 x 10 alternating, holding treadmill armrest Forward step up to Airex pad; 1x10 surgical LE leading    PATIENT EDUCATION: Discussed continued use of ice at home prn and continued work on hip strengthening and progressive loading of hip flexors.    *not today* Sit to stand: 1x6 and 1x4; from chair with 8-lb Goblet hold  Unipedal stance; 2 x 15 sec Alternating hip flexion with Tband; 2x10 with Red Tband  -added to HEP  Bridge 3x12:  Black Tband at distal femurs with good carryover in form/technique.  Butterfly stretch; 3 x 20 sec        PATIENT EDUCATION:  Education details: see above  for patient education details Person educated: Patient Education method: Explanation and Demonstration Education comprehension: verbalized understanding   HOME EXERCISE PROGRAM: Access Code: [email or web address] URL: [email or web address] Date: 12/14/2022 Prepared by: Consuela Mimes  Exercises - Sit to Stand  - 1 x daily - 7 x weekly - 2 sets - 10 reps - Supine Bridge with Resistance Band  - 1 x daily - 7 x weekly - 2 sets - 10 reps - Sidelying Hip Abduction  - 1 x daily - 7 x weekly - 2 sets - 10 reps - Standing 3-Way Kick  - 1 x daily - 7 x weekly - 2 sets - 10 reps     ASSESSMENT:   CLINICAL IMPRESSION:  Patent has more groin pain that expected at 5 months post-operatively with bony healing timeline expected over first 8-12 weeks. He has substantially improved hip ROM tolerated (within posterior hip precautions), ability to ambulate without reliance on AD, and hip strength since initial evaluation. He has ongoing hip abductor and extensor weakness, and he has intermittent notable pain affecting L groin which can limit activity. Pain in L groin/hip flexor region has not responded significantly to manual therapy techniques. We have utilized ice pack prn for pain control and have worked on progressive exercise to improve tolerance to loading affected tissues. NPRS has generally been low-level per report, but pt is oftentimes limited with volume of exercise/activity due to pain. Patient will benefit from continued skilled therapeutic intervention to address the remaining  deficits in hip strength, mobility, and weightbearing tolerance as needed for improved function and ability to perform work duties.      OBJECTIVE IMPAIRMENTS: Abnormal gait, decreased activity tolerance, decreased balance, decreased endurance, decreased knowledge of condition, decreased knowledge of use of DME, decreased mobility, difficulty walking, decreased ROM, decreased strength, and dizziness.    ACTIVITY  LIMITATIONS: carrying, lifting, bending, sitting, standing, squatting, sleeping, stairs, transfers, dressing, and locomotion level   PARTICIPATION LIMITATIONS: cleaning, shopping, community activity, occupation, and yard work   PERSONAL FACTORS:  Pt is impulsive and eager to get better so that he can return to work.   are also affecting patient's functional outcome.    REHAB POTENTIAL: Good   CLINICAL DECISION MAKING: Stable/uncomplicated   EVALUATION COMPLEXITY: Low     GOALS: Goals reviewed with patient? Yes   SHORT TERM GOALS: Target date: 12/22/2022 Pt will improve L hip ROM to 10 to 90 degrees without pain in order to promote normal gait and stability.  Baseline: 20 degree Hip felx and 0 deg Ext 12/04/22: 90 deg hip flex and 5 deg EXT 12/18/22: 90 deg hip flex and 10 deg hip EXT Goal status: ACHIEVED    2.  Pt will be able to ambulate 524ft safely with Cane without fall and pain . Baseline: 30 ft with SBA and heavy reliance on [facility].   12/04/22: Pt uses SPC for primary means of mobility in home and community at this time.  Goal status: ACHIEVED       LONG TERM GOALS: Target date: 01/26/2023   Pt will Improve TUG time to <10secs without SPC to demonstrate decreased fall risk.  Baseline: 19 secs with SPC 12/04/22: 14.6 seconds with SPC 12/18/22: 9.5 sec Goal status: ACHIEVED   2.  Pt will Improve LEFS score to >65/80 to demonstrate improved QoL Baseline: 18/80 12/04/22: 20/80  12/18/22: 22/80 01/29/23: 25/80  Goal status: ON-GOING   3.  Pt will be able to ambulate Independently  with LRAD without LOB and pain in order to return to community level mobility safely  Baseline: With [facility] and [facility].   12/04/22: ModI ambulation with SPC/LRAD with mild pain at longer distances, no LOB 12/18/22: Amb IND with SPC with no LOB Goal status: ACHIEVED    4.  Pt will score >50 on FOTO Baseline: 36/56 12/04/22: 35/56 12/18/22: 37/56 01/29/23: 43/56 Goal status: IN PROGRESS   5.  Pt will demonstrate  4/5 L hip strength without pain to improve stability and safety with all functional mobility in order to return to work.  Baseline: 2/5 grossly    12/04/22: 4-/5 grossly 12/18/22: 4-/5 grossly 01/29/23: 4/5 for hip flexors/quads/HS, 3+/5 for hip ABD and EXT.  Goal status: IN PROGRESS          PLAN:   PT FREQUENCY: 2x/week   PT DURATION: 4 weeks   PLANNED INTERVENTIONS: Therapeutic exercises, Therapeutic activity, Neuromuscular re-education, Balance training, Gait training, Patient/Family education, Self Care, and Joint mobilization   PLAN FOR NEXT SESSION: Progressive gait and stepping activity with gradual AD wean, progressive LE strengthening. Manual therapy and/or modalities as needed for anterior hip and/or posterolateral hip pain.  F/u on blood pressure control and take vital signs at arrival.    Consuela Mimes, PT, DPT [phone]  Physical Therapist- [facility]  03/08/2023, 9:15 AM

## 2023-03-07 ENCOUNTER — Encounter: Payer: Self-pay | Admitting: Physical Therapy

## 2023-03-08 ENCOUNTER — Ambulatory Visit: Payer: Worker's Compensation | Admitting: Physical Therapy

## 2023-03-08 ENCOUNTER — Encounter: Payer: Self-pay | Admitting: Physical Therapy

## 2023-03-08 VITALS — BP 151/90

## 2023-03-08 DIAGNOSIS — Z96642 Presence of left artificial hip joint: Secondary | ICD-10-CM | POA: Diagnosis not present

## 2023-03-08 DIAGNOSIS — R2689 Other abnormalities of gait and mobility: Secondary | ICD-10-CM

## 2023-03-08 DIAGNOSIS — R262 Difficulty in walking, not elsewhere classified: Secondary | ICD-10-CM

## 2023-03-08 DIAGNOSIS — R29898 Other symptoms and signs involving the musculoskeletal system: Secondary | ICD-10-CM

## 2023-03-08 NOTE — Therapy (Addendum)
OUTPATIENT PHYSICAL THERAPY TREATMENT    Patient Name: Christian Hughes MRN: [id number] DOB:1961-04-30, 62 y.o., male Today's Date: 03/08/2023'   END OF SESSION:   PT End of Session - 03/08/23 1506     Visit Number 26    Number of Visits 28    Date for PT Re-Evaluation 02/28/23    Authorization Type Workers Compensation    Authorization Time Period 12/18/22-02/22/23    Progress Note Due on Visit 20    PT Start Time 1504    PT Stop Time 1551    PT Time Calculation (min) 47 min    Activity Tolerance Patient tolerated treatment well;Patient limited by pain    Behavior During Therapy [facility] for tasks assessed/performed;Impulsive             Past Medical History:  Diagnosis Date   Engages in vaping    H/O ventral hernia    S/p of repair   Hypertension Early 2010's   Marijuana use    Orthostatic dizziness 10/08/2022   Past Surgical History:  Procedure Laterality Date   HIP ARTHROPLASTY Left 10/07/2022   Procedure: ARTHROPLASTY BIPOLAR HIP (HEMIARTHROPLASTY);  Surgeon: Juanell Fairly, MD;  Location: [facility] ORS;  Service: Orthopedics;  Laterality: Left;   JOINT REPLACEMENT  10/06/2022   Left Hip Replacement due fall while at work   Ventral hernia repair     Patient Active Problem List   Diagnosis Date Noted   Osteopenia determined by x-ray 02/12/2023   Hyperlipidemia 01/10/2023   Prediabetes 01/10/2023   Primary hypertension 01/10/2023   Left ventricular hypertrophy 01/10/2023   Abnormal EKG 10/06/2022   Bone lesion_sclerotic lesions in the right iliac bone 10/06/2022    PCP: Dewaine Oats MD  REFERRING PROVIDER: Juanell Fairly MD   REFERRING DIAG: [phone] (ICD-10-CM) - Presence of left artificial hip joint   THERAPY DIAG:  History of left hip hemiarthroplasty  Weakness of left leg  Balance problems  Difficulty in walking, not elsewhere classified  Rationale for Evaluation and Treatment Rehabilitation  PERTINENT HISTORY: From initial eval: Christian Hughes is a 62  y.o. male without significant past medical history except for occasional vaping amd marijuana use, who presents with fall and left hip pain.  Pt states that he slipped and fell at work at about 11:00 AM.  Denies loss of consciousness.  He injured his left hip, causing left hip pain, which is constant, sharp, severe, nonradiating, aggravated by movement.  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    "My mom took care of me after my hip surgery. I had therapy at home. I returned to my home alone after my first follow up appointment. I have pain in my buttocks, groin and the corner of my buttocks of about 2 to 3/10. I have high pain tolerance. I am unable to walk normal and I feel weak. I began to use this cane on my own after my PT at home stopped.  I can't wait to go to work because, I love to work."   PAIN:  Are you having pain? Yes: Pain location: 2-3/10 Pain description: stabbing Aggravating factors: Moving, walking, WB   Relieving factors: rest, OTC meds, ice    WEIGHT BEARING RESTRICTIONS: Yes:  WBAT   FALLS:  Has patient fallen in last 6 months? Yes. Number of falls once once 3 months ago   LIVING ENVIRONMENT: Lives with:  lives alone Lives in: Mobile home Stairs: Yes: External: 4 steps; can reach both Has following equipment at home: Single point cane   OCCUPATION: Works in the office for [employer]. 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 Pt negotiates some small staircases in town. Pt has historically taken 24-packs of water bottles to his work for customers.    PLOF: Independent   PATIENT GOALS: "I want to return to work without hurting and falling as soon as I can."   PRECAUTIONS: Posterior hip precautions      OBJECTIVE: (objective measures completed at  initial evaluation unless otherwise dated)   PATIENT SURVEYS:  LEFS 18/80 FOTO 36        PALPATION: Pain in L sacral border, Iliac crest, gluteus medius, TFL, and greater trochanter     LOWER EXTREMITY ROM:    Active ROM Right eval Left eval  Hip flexion WFL 20 deg with pain  Hip extension [facility]    Hip abduction WFL 20 with pain   Hip adduction [facility]    Hip internal rotation [facility]    Hip external rotation [facility]    Knee flexion [facility]  Knee extension [facility]  Ankle dorsiflexion [facility]  Ankle plantarflexion [facility]  Ankle inversion [facility]  Ankle eversion WFL WFL   (Blank rows = not tested)   LOWER EXTREMITY MMT:   MMT Right eval Left eval Left 12/04/22 Left 12/18/22 Left 01/29/23  Hip flexion WFL 2+ 4* 4- 4  Hip extension WFL 2 3+  3+*  Hip abduction WFl 2 4-  3+*  Hip adduction WFl       Hip internal rotation [facility]       Hip external rotation [facility]       Knee flexion WFL 3- 3+ 4- 4  Knee extension WFl 3 4+ 4* 4*  Ankle dorsiflexion WFL 3+     Ankle plantarflexion WFL 4     Ankle inversion WFL 4     Ankle eversion WFL 3-      (Blank rows = not tested) Pt's L ankle remains inverted 10 degrees since the Surgery.     FUNCTIONAL TESTS (INITIAL EVALUATION) 30 seconds chair stand test: 5 reps completed Timed up and go (TUG): 19secs 10 meter walk test: 12secs= 1,2 m/secs             Balance Test : SL standing unable, Modified tandem 10 secs, Tandem Unable, Rhomberg with EO 5 secs and unable with EC.  GAIT: Distance walked: 30 ft  Assistive device utilized: Single point cane Level of assistance: SBA Comments: Pt unsteady on feet with severe antalgic gait, decreased loading response, absent heel strike and absent toe off gait with uneven stride and decreased speed. Heavy reliance on the [facility]       TODAY'S TREATMENT:  DATE: 03/08/2023    L hip  hemiarthroplasty following L femoral neck fracture (DOS: 10/07/22)   SUBJECTIVE STATEMENT:  Pt reports remaining groin pain this week; he feels that it hurts worse with severe weather this week. 5/10 pain at arrival to PT. Patient reports feeling about the same between Tuesday/Wednesday/today. Patient reports compliance with his HEP.    Vitals taken pre-exercise: Today's Vitals   03/08/23 1507 03/08/23 1535  BP: (!) 171/95 (!) 151/90      There is no height or weight on file to calculate BMI.     Therapeutic Exercise - for improved soft tissue flexibility and extensibility as needed for ROM, improved strength as needed to improve performance of CKC activities/functional movements   Nu-Step L3. Seat at 12 to maintain posterior hip flexion precautions. 5 min, for warm up     Cold pack (unbilled) - for anti-inflammatory and analgesic effect as needed for reduced pain and improved ability to participate in active PT intervention, along L anterior in supine with bolster under knees for relaxation, x 5 minutes    Glute bridge with 10-lb ankle weight on pelvis; 2x8 Sidelying hip abduction; 2x10; VC/TC for leg positioning to limit hip flexion Toe tapping; 2x10 alternating R/L with 12-inch step, no UE support; stance on Airex pad Side steps in // bars: x3 D/B with Blue Tband  Standing march with 5-lb ankle weights; 2 x 10 alternating, holding treadmill armrest Forward step up to Airex pad; 1x10 surgical LE leading    PATIENT EDUCATION: Discussed continued use of ice at home prn and continued work on hip strengthening and progressive loading of hip flexors.    *not today* Sit to stand: 1x6 and 1x4; from chair with 8-lb Goblet hold  Unipedal stance; 2 x 15 sec Alternating hip flexion with Tband; 2x10 with Red Tband  -added to HEP  Bridge 3x12:  Black Tband at distal femurs with good carryover in form/technique.  Butterfly stretch; 3 x 20 sec        PATIENT EDUCATION:   Education details: see above for patient education details Person educated: Patient Education method: Explanation and Demonstration Education comprehension: verbalized understanding   HOME EXERCISE PROGRAM: Access Code: [email or web address] URL: [email or web address] Date: 12/14/2022 Prepared by: Consuela Mimes  Exercises - Sit to Stand  - 1 x daily - 7 x weekly - 2 sets - 10 reps - Supine Bridge with Resistance Band  - 1 x daily - 7 x weekly - 2 sets - 10 reps - Sidelying Hip Abduction  - 1 x daily - 7 x weekly - 2 sets - 10 reps - Standing 3-Way Kick  - 1 x daily - 7 x weekly - 2 sets - 10 reps     ASSESSMENT:   CLINICAL IMPRESSION:  Patent does have more pain than expected at 5 months post-op given expected bony/post-op healing timeline. Patient has been able to successfully wean from use of AD and uses SPC seldom for longer-distance walking. Patient participates very well with PT and does demonstrate good faith effort, but he is limited by notable groin pain with higher volume of weightbearing activity. Pt does have significant gluteus medius and hip extensor weakness remaining, and this is likely contributing to difficulty with loading surgical LE.  Patient will benefit from continued skilled therapeutic intervention to address the remaining deficits in hip strength, mobility, and weightbearing tolerance as needed for improved function and ability to perform work duties.      OBJECTIVE IMPAIRMENTS: Abnormal gait, decreased  activity tolerance, decreased balance, decreased endurance, decreased knowledge of condition, decreased knowledge of use of DME, decreased mobility, difficulty walking, decreased ROM, decreased strength, and dizziness.    ACTIVITY LIMITATIONS: carrying, lifting, bending, sitting, standing, squatting, sleeping, stairs, transfers, dressing, and locomotion level   PARTICIPATION LIMITATIONS: cleaning, shopping, community activity, occupation, and yard work    PERSONAL FACTORS:  Pt is impulsive and eager to get better so that he can return to work.   are also affecting patient's functional outcome.    REHAB POTENTIAL: Good   CLINICAL DECISION MAKING: Stable/uncomplicated   EVALUATION COMPLEXITY: Low     GOALS: Goals reviewed with patient? Yes   SHORT TERM GOALS: Target date: 12/22/2022 Pt will improve L hip ROM to 10 to 90 degrees without pain in order to promote normal gait and stability.  Baseline: 20 degree Hip felx and 0 deg Ext 12/04/22: 90 deg hip flex and 5 deg EXT 12/18/22: 90 deg hip flex and 10 deg hip EXT Goal status: ACHIEVED    2.  Pt will be able to ambulate 573ft safely with Cane without fall and pain . Baseline: 30 ft with SBA and heavy reliance on [facility].   12/04/22: Pt uses SPC for primary means of mobility in home and community at this time.  Goal status: ACHIEVED       LONG TERM GOALS: Target date: 01/26/2023   Pt will Improve TUG time to <10secs without SPC to demonstrate decreased fall risk.  Baseline: 19 secs with SPC 12/04/22: 14.6 seconds with SPC 12/18/22: 9.5 sec Goal status: ACHIEVED   2.  Pt will Improve LEFS score to >65/80 to demonstrate improved QoL Baseline: 18/80 12/04/22: 20/80  12/18/22: 22/80 01/29/23: 25/80  Goal status: ON-GOING   3.  Pt will be able to ambulate Independently  with LRAD without LOB and pain in order to return to community level mobility safely  Baseline: With [facility] and [facility].   12/04/22: ModI ambulation with SPC/LRAD with mild pain at longer distances, no LOB 12/18/22: Amb IND with SPC with no LOB Goal status: ACHIEVED    4.  Pt will score >50 on FOTO Baseline: 36/56 12/04/22: 35/56 12/18/22: 37/56 01/29/23: 43/56 Goal status: IN PROGRESS   5.  Pt will demonstrate 4/5 L hip strength without pain to improve stability and safety with all functional mobility in order to return to work.  Baseline: 2/5 grossly    12/04/22: 4-/5 grossly 12/18/22: 4-/5 grossly 01/29/23: 4/5 for hip flexors/quads/HS,  3+/5 for hip ABD and EXT.  Goal status: IN PROGRESS         PLAN:   PT FREQUENCY: 2x/week   PT DURATION: 4 weeks   PLANNED INTERVENTIONS: Therapeutic exercises, Therapeutic activity, Neuromuscular re-education, Balance training, Gait training, Patient/Family education, Self Care, and Joint mobilization   PLAN FOR NEXT SESSION: Progressive gait and stepping activity with gradual AD wean, progressive LE strengthening. Manual therapy and/or modalities as needed for anterior hip and/or posterolateral hip pain.  F/u on blood pressure control and take vital signs at arrival.     Consuela Mimes, PT, DPT [phone] Physical Therapist- [facility]  03/08/2023, 3:52 PM

## 2023-03-13 ENCOUNTER — Ambulatory Visit: Payer: Worker's Compensation | Admitting: Physical Therapy

## 2023-03-13 DIAGNOSIS — Z96642 Presence of left artificial hip joint: Secondary | ICD-10-CM

## 2023-03-13 DIAGNOSIS — R262 Difficulty in walking, not elsewhere classified: Secondary | ICD-10-CM

## 2023-03-13 DIAGNOSIS — R2689 Other abnormalities of gait and mobility: Secondary | ICD-10-CM

## 2023-03-13 DIAGNOSIS — R29898 Other symptoms and signs involving the musculoskeletal system: Secondary | ICD-10-CM

## 2023-03-13 NOTE — Therapy (Unsigned)
OUTPATIENT PHYSICAL THERAPY TREATMENT    Patient Name: Christian Hughes MRN: [id number] DOB:Oct 29, 1960, 62 y.o., male Today's Date: 03/13/2023'   END OF SESSION:   PT End of Session - 03/13/23 1520     Visit Number 27    Number of Visits 28    Date for PT Re-Evaluation 02/28/23    Authorization Type Workers Compensation    Authorization Time Period 12/18/22-02/22/23    Progress Note Due on Visit 20    PT Start Time 1515    PT Stop Time 1600    PT Time Calculation (min) 45 min    Activity Tolerance Patient tolerated treatment well;Patient limited by pain    Behavior During Therapy [facility] for tasks assessed/performed;Impulsive             Past Medical History:  Diagnosis Date   Engages in vaping    H/O ventral hernia    S/p of repair   Hypertension Early 2010's   Marijuana use    Orthostatic dizziness 10/08/2022   Past Surgical History:  Procedure Laterality Date   HIP ARTHROPLASTY Left 10/07/2022   Procedure: ARTHROPLASTY BIPOLAR HIP (HEMIARTHROPLASTY);  Surgeon: Christian Fairly, MD;  Location: [facility] ORS;  Service: Orthopedics;  Laterality: Left;   JOINT REPLACEMENT  10/06/2022   Left Hip Replacement due fall while at work   Ventral hernia repair     Patient Active Problem List   Diagnosis Date Noted   Osteopenia determined by x-ray 02/12/2023   Hyperlipidemia 01/10/2023   Prediabetes 01/10/2023   Primary hypertension 01/10/2023   Left ventricular hypertrophy 01/10/2023   Abnormal EKG 10/06/2022   Bone lesion_sclerotic lesions in the right iliac bone 10/06/2022    PCP: Christian Oats MD  REFERRING PROVIDER: Juanell Fairly MD   REFERRING DIAG: [phone] (ICD-10-CM) - Presence of left artificial hip joint   THERAPY DIAG:  History of left hip hemiarthroplasty  Weakness of left leg  Balance problems  Difficulty in walking, not elsewhere classified  Rationale for Evaluation and Treatment Rehabilitation  PERTINENT HISTORY: From initial eval: Christian Hughes is a  62 y.o. male without significant past medical history except for occasional vaping amd marijuana use, who presents with fall and left hip pain.  Pt states that he slipped and fell at work at about 11:00 AM.  Denies loss of consciousness.  He injured his left hip, causing left hip pain, which is constant, sharp, severe, nonradiating, aggravated by movement.  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    "My mom took care of me after my hip surgery. I had therapy at home. I returned to my home alone after my first follow up appointment. I have pain in my buttocks, groin and the corner of my buttocks of about 2 to 3/10. I have high pain tolerance. I am unable to walk normal and I feel weak. I began to use this cane on my own after my PT at home stopped.  I can't wait to go to work because, I love to work."   PAIN:  Are you having pain? Yes: Pain location: 2-3/10 Pain description: stabbing Aggravating factors: Moving, walking, WB   Relieving factors: rest, OTC meds, ice    WEIGHT BEARING RESTRICTIONS: Yes:  WBAT   FALLS:  Has patient fallen in last 6 months? Yes. 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 Pt negotiates some small staircases in town. Pt has historically taken 24-packs of water bottles to his work for customers.    PLOF: Independent   PATIENT GOALS: "I want to return to work without hurting and falling as soon as I can."   PRECAUTIONS: Posterior hip precautions      OBJECTIVE: (objective measures completed  at initial evaluation unless otherwise dated)   PATIENT SURVEYS:  LEFS 18/80 FOTO 36        PALPATION: Pain in L sacral border, Iliac crest, gluteus medius, TFL, and greater trochanter     LOWER EXTREMITY ROM:    Active ROM Right eval Left eval  Hip flexion WFL 20 deg with pain  Hip extension [facility]    Hip abduction WFL 20 with pain   Hip adduction [facility]    Hip internal rotation [facility]    Hip external rotation [facility]    Knee flexion [facility]  Knee extension [facility]  Ankle dorsiflexion [facility]  Ankle plantarflexion [facility]  Ankle inversion [facility]  Ankle eversion WFL WFL   (Blank rows = not tested)   LOWER EXTREMITY MMT:   MMT Right eval Left eval Left 12/04/22 Left 12/18/22 Left 01/29/23  Hip flexion WFL 2+ 4* 4- 4  Hip extension WFL 2 3+  3+*  Hip abduction WFl 2 4-  3+*  Hip adduction WFl       Hip internal rotation [facility]       Hip external rotation [facility]       Knee flexion WFL 3- 3+ 4- 4  Knee extension WFl 3 4+ 4* 4*  Ankle dorsiflexion WFL 3+     Ankle plantarflexion WFL 4     Ankle inversion WFL 4     Ankle eversion WFL 3-      (Blank rows = not tested) Pt's L ankle remains inverted 10 degrees since the Surgery.     FUNCTIONAL TESTS (INITIAL EVALUATION) 30 seconds chair stand test: 5 reps completed Timed up and go (TUG): 19secs 10 meter walk test: 12secs= 1,2 m/secs             Balance Test : SL standing unable, Modified tandem 10 secs, Tandem Unable, Rhomberg with EO 5 secs and unable with EC.  GAIT: Distance walked: 30 ft  Assistive device utilized: Single point cane Level of assistance: SBA Comments: Pt unsteady on feet with severe antalgic gait, decreased loading response, absent heel strike and absent toe off gait with uneven stride and decreased speed. Heavy reliance on the [facility]       TODAY'S TREATMENT:  DATE: 03/13/2023    L hip  hemiarthroplasty following L femoral neck fracture (DOS: 10/07/22)   SUBJECTIVE STATEMENT:  Pt reports no concerning S&S at arrival consistent with hypertensive urgency. Patient reports 4/10 pain affecting his groin at arrival to PT. Patient reports doing relatively well after last visit. Pt feels that groin pain is about the same as last week.    Vitals taken pre-exercise: There were no vitals filed for this visit.     There is no height or weight on file to calculate BMI.     Therapeutic Exercise - for improved soft tissue flexibility and extensibility as needed for ROM, improved strength as needed to improve performance of CK[facility] activities/functional movements   Nu-Step L3. Seat at 12 to maintain posterior hip flexion precautions. 5 min, for warm up  - pt elects to increase to L4, and then backs down to L3 again due to groin pain     Cold pack (unbilled) - for anti-inflammatory and analgesic effect as needed for reduced pain and improved ability to participate in active PT intervention, along L anterior in supine with bolster under knees for relaxation, x 5 minutes   Thomas hip flexor stretch; 3 x 30 sec Butterfly stretch in supine; 3 x 30 sec Glute bridge with 10-lb ankle weight on pelvis; 2x8 Sidelying hip abduction; 2x10; VC/TC for leg positioning to limit hip flexion  Side steps in // bars: x3 D/B with Blue Tband   PATIENT EDUCATION: Discussed continued use of ice at home prn and continued work on hip strengthening and progressive loading of hip flexors.    *not today* Toe tapping; 2x10 alternating R/L with 12-inch step, no UE support; stance on Airex pad Forward step up to Airex pad; 1x10 surgical LE leading  Standing march with 5-lb ankle weights; 2 x 10 alternating, holding treadmill armrest Sit to stand: 1x6 and 1x4; from chair with 8-lb Goblet hold  Unipedal stance; 2 x 15 sec Alternating hip flexion with Tband; 2x10 with Red Tband  -added to HEP  Bridge 3x12:   Black Tband at distal femurs with good carryover in form/technique.  Butterfly stretch; 3 x 20 sec        PATIENT EDUCATION:  Education details: see above for patient education details Person educated: Patient Education method: Explanation and Demonstration Education comprehension: verbalized understanding   HOME EXERCISE PROGRAM: Access Code: [email or web address] URL: [email or web address] Date: 12/14/2022 Prepared by: Consuela Mimes  Exercises - Sit to Stand  - 1 x daily - 7 x weekly - 2 sets - 10 reps - Supine Bridge with Resistance Band  - 1 x daily - 7 x weekly - 2 sets - 10 reps - Sidelying Hip Abduction  - 1 x daily - 7 x weekly - 2 sets - 10 reps - Standing 3-Way Kick  - 1 x daily - 7 x weekly - 2 sets - 10 reps     ASSESSMENT:   CLINICAL IMPRESSION:  Patent continues to experience more pain than expected at 5 months post-op given expected bony/post-op healing timeline. Patient has been able to successfully wean from use of AD and uses SPC seldom for longer-distance walking. Patient participates very well with PT and does demonstrate good faith effort, but he is limited by notable groin pain with higher volume of weightbearing activity. Pt does have significant gluteus medius and hip extensor weakness remaining, and this is likely contributing to difficulty with loading surgical LE.  Patient will benefit from continued skilled therapeutic intervention to  address the remaining deficits in hip strength, mobility, and weightbearing tolerance as needed for improved function and ability to perform work duties.      OBJECTIVE IMPAIRMENTS: Abnormal gait, decreased activity tolerance, decreased balance, decreased endurance, decreased knowledge of condition, decreased knowledge of use of DME, decreased mobility, difficulty walking, decreased ROM, decreased strength, and dizziness.    ACTIVITY LIMITATIONS: carrying, lifting, bending, sitting, standing, squatting, sleeping,  stairs, transfers, dressing, and locomotion level   PARTICIPATION LIMITATIONS: cleaning, shopping, community activity, occupation, and yard work   PERSONAL FACTORS:  Pt is impulsive and eager to get better so that he can return to work.   are also affecting patient's functional outcome.    REHAB POTENTIAL: Good   CLINICAL DECISION MAKING: Stable/uncomplicated   EVALUATION COMPLEXITY: Low     GOALS: Goals reviewed with patient? Yes   SHORT TERM GOALS: Target date: 12/22/2022 Pt will improve L hip ROM to 10 to 90 degrees without pain in order to promote normal gait and stability.  Baseline: 20 degree Hip felx and 0 deg Ext 12/04/22: 90 deg hip flex and 5 deg EXT 12/18/22: 90 deg hip flex and 10 deg hip EXT Goal status: ACHIEVED    2.  Pt will be able to ambulate 556ft safely with Cane without fall and pain . Baseline: 30 ft with SBA and heavy reliance on [facility].   12/04/22: Pt uses SPC for primary means of mobility in home and community at this time.  Goal status: ACHIEVED       LONG TERM GOALS: Target date: 01/26/2023   Pt will Improve TUG time to <10secs without SPC to demonstrate decreased fall risk.  Baseline: 19 secs with SPC 12/04/22: 14.6 seconds with SPC 12/18/22: 9.5 sec Goal status: ACHIEVED   2.  Pt will Improve LEFS score to >65/80 to demonstrate improved QoL Baseline: 18/80 12/04/22: 20/80  12/18/22: 22/80 01/29/23: 25/80  Goal status: ON-GOING   3.  Pt will be able to ambulate Independently  with LRAD without LOB and pain in order to return to community level mobility safely  Baseline: With [facility] and [facility].   12/04/22: ModI ambulation with SPC/LRAD with mild pain at longer distances, no LOB 12/18/22: Amb IND with SPC with no LOB Goal status: ACHIEVED    4.  Pt will score >50 on FOTO Baseline: 36/56 12/04/22: 35/56 12/18/22: 37/56 01/29/23: 43/56 Goal status: IN PROGRESS   5.  Pt will demonstrate 4/5 L hip strength without pain to improve stability and safety with all  functional mobility in order to return to work.  Baseline: 2/5 grossly    12/04/22: 4-/5 grossly 12/18/22: 4-/5 grossly 01/29/23: 4/5 for hip flexors/quads/HS, 3+/5 for hip ABD and EXT.  Goal status: IN PROGRESS         PLAN:   PT FREQUENCY: 2x/week   PT DURATION: 4 weeks   PLANNED INTERVENTIONS: Therapeutic exercises, Therapeutic activity, Neuromuscular re-education, Balance training, Gait training, Patient/Family education, Self Care, and Joint mobilization   PLAN FOR NEXT SESSION: Progressive gait and stepping activity with gradual AD wean, progressive LE strengthening. Manual therapy and/or modalities as needed for anterior hip and/or posterolateral hip pain.  F/u on blood pressure control and take vital signs at arrival.     Consuela Mimes, PT, DPT [phone] Physical Therapist- [facility]  03/13/2023, 3:33 PM

## 2023-03-14 ENCOUNTER — Encounter: Payer: Self-pay | Admitting: Physical Therapy

## 2023-03-15 ENCOUNTER — Ambulatory Visit: Payer: Worker's Compensation | Admitting: Physical Therapy

## 2023-03-15 ENCOUNTER — Encounter: Payer: Self-pay | Admitting: Physical Therapy

## 2023-03-15 VITALS — BP 152/93

## 2023-03-15 DIAGNOSIS — R262 Difficulty in walking, not elsewhere classified: Secondary | ICD-10-CM

## 2023-03-15 DIAGNOSIS — R29898 Other symptoms and signs involving the musculoskeletal system: Secondary | ICD-10-CM

## 2023-03-15 DIAGNOSIS — Z96642 Presence of left artificial hip joint: Secondary | ICD-10-CM | POA: Diagnosis not present

## 2023-03-15 DIAGNOSIS — R2689 Other abnormalities of gait and mobility: Secondary | ICD-10-CM

## 2023-03-15 NOTE — Therapy (Signed)
OUTPATIENT PHYSICAL THERAPY TREATMENT/GOAL UPDATE   Patient Name: Christian Hughes MRN: [id number] DOB:Jul 02, 1961, 62 y.o., male Today's Date: 03/15/2023   END OF SESSION:   PT End of Session - 03/15/23 1502     Visit Number 28    Authorization Type Workers Compensation    Progress Note Due on Visit 20    PT Start Time 1503    PT Stop Time 1546    PT Time Calculation (min) 43 min    Activity Tolerance Patient tolerated treatment well;Patient limited by pain    Behavior During Therapy WFL for tasks assessed/performed;Impulsive              Past Medical History:  Diagnosis Date   Engages in vaping    H/O ventral hernia    S/p of repair   Hypertension Early 2010's   Marijuana use    Orthostatic dizziness 10/08/2022   Past Surgical History:  Procedure Laterality Date   HIP ARTHROPLASTY Left 10/07/2022   Procedure: ARTHROPLASTY BIPOLAR HIP (HEMIARTHROPLASTY);  Surgeon: Juanell Fairly, MD;  Location: [facility] ORS;  Service: Orthopedics;  Laterality: Left;   JOINT REPLACEMENT  10/06/2022   Left Hip Replacement due fall while at work   Ventral hernia repair     Patient Active Problem List   Diagnosis Date Noted   Osteopenia determined by x-ray 02/12/2023   Hyperlipidemia 01/10/2023   Prediabetes 01/10/2023   Primary hypertension 01/10/2023   Left ventricular hypertrophy 01/10/2023   Abnormal EKG 10/06/2022   Bone lesion_sclerotic lesions in the right iliac bone 10/06/2022    PCP: Dewaine Oats MD  REFERRING PROVIDER: Juanell Fairly MD   REFERRING DIAG: [phone] (ICD-10-CM) - Presence of left artificial hip joint   THERAPY DIAG:  History of left hip hemiarthroplasty  Weakness of left leg  Balance problems  Difficulty in walking, not elsewhere classified  Rationale for Evaluation and Treatment Rehabilitation  PERTINENT HISTORY: From initial eval: [name] is a 62 y.o. male without significant past medical history except for occasional vaping amd marijuana  use, who presents with fall and left hip pain.  Pt states that he slipped and fell at work at about 11:00 AM.  Denies loss of consciousness.  He injured his left hip, causing left hip pain, which is constant, sharp, severe, nonradiating, aggravated by movement.  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    "My mom took care of me after my hip surgery. I had therapy at home. I returned to my home alone after my first follow up appointment. I have pain in my buttocks, groin and the corner of my buttocks of about 2 to 3/10. I have high pain tolerance. I am unable to walk normal and I feel weak. I began to use this cane on my own after my PT at home stopped.  I can't wait to go to work because, I love to work."   PAIN:  Are you having pain? Yes: Pain location: 2-3/10 Pain description: stabbing Aggravating factors: Moving, walking, WB   Relieving factors: rest, OTC meds, ice    WEIGHT BEARING RESTRICTIONS: Yes:  WBAT   FALLS:  Has patient fallen in last 6 months? Yes. 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 Pt negotiates some small staircases in town. Pt has historically taken 24-packs of water bottles to his work for customers.    PLOF: Independent   PATIENT GOALS: "I want to return to work without hurting and falling as soon as I can."   PRECAUTIONS: Posterior hip precautions      OBJECTIVE: (objective measures completed at initial evaluation unless otherwise dated)   PATIENT SURVEYS:  LEFS 18/80 FOTO 36         PALPATION: Pain in L sacral border, Iliac crest, gluteus medius, TFL, and greater trochanter     LOWER EXTREMITY ROM:    Active ROM Right eval Left eval  Hip flexion WFL 20 deg with pain  Hip extension [facility]    Hip abduction WFL 20 with pain   Hip adduction [facility]    Hip internal rotation [facility]    Hip external rotation [facility]    Knee flexion [facility]  Knee extension [facility]  Ankle dorsiflexion [facility]  Ankle plantarflexion [facility]  Ankle inversion [facility]  Ankle eversion WFL WFL   (Blank rows = not tested)   LOWER EXTREMITY MMT:   MMT Right eval Left eval Left 12/04/22 Left 12/18/22 Left 01/29/23 Left 03/15/23  Hip flexion WFL 2+ 4* 4- 4 4*  Hip extension WFL 2 3+  3+* 3+*  Hip abduction WFl 2 4-  3+* 4*  Hip adduction WFl        Hip internal rotation [facility]        Hip external rotation [facility]        Knee flexion WFL 3- 3+ 4- 4 4+*  Knee extension WFl 3 4+ 4* 4* 4-*  Ankle dorsiflexion WFL 3+      Ankle plantarflexion WFL 4      Ankle inversion WFL 4      Ankle eversion WFL 3-       (Blank rows = not tested) Pt's L ankle remains inverted 10 degrees since the Surgery.     FUNCTIONAL TESTS (INITIAL EVALUATION) 30 seconds chair stand test: 5 reps completed Timed up and go (TUG): 19secs 10 meter walk test: 12secs= 1,2 m/secs             Balance Test : SL standing unable, Modified tandem 10 secs, Tandem Unable, Rhomberg with EO 5 secs and unable with EC.  GAIT: Distance walked: 30 ft  Assistive device utilized: Single point cane Level of assistance: SBA Comments: Pt unsteady on feet with severe antalgic gait, decreased loading response, absent heel strike and absent toe off gait with uneven stride and decreased speed. Heavy reliance on the [facility]       TODAY'S TREATMENT:  DATE: 03/15/2023    L hip hemiarthroplasty following L femoral neck fracture (DOS:  10/07/22)   SUBJECTIVE STATEMENT:  Pt was seen by referring surgeon yesterday after our office contacted them regarding persisting groin pain. MD recommended medrol Dosepack and pt will get new referral for L hip and low back strengthening. Pt reports 2.5-3/10 pain affecting groin. He reports he just started medrol this AM. He reports ongoing "tweaking" pain affecting L groin with prolonged/continuous weightbearing and with lifting LLE.    Today's Vitals   03/15/23 1511  BP: (!) 152/93    There is no height or weight on file to calculate BMI.     Therapeutic Exercise - for improved soft tissue flexibility and extensibility as needed for ROM, improved strength as needed to improve performance of [facility] activities/functional movements    *GOAL UPDATE PERFORMED   Nu-Step L3. Seat at 12 to maintain posterior hip flexion precautions. 5 min, for warm up     Cold pack (unbilled) - for anti-inflammatory and analgesic effect as needed for reduced pain and improved ability to participate in active PT intervention, along L anterior in supine with bolster under knees for relaxation, x 8 minutes   Thomas hip flexor stretch; reviewed Butterfly stretch in supine; reviewed Glute bridge with 10-lb ankle weight on pelvis; 2x8 Sidelying hip abduction; 2x10; VC/TC for leg positioning to limit hip flexion  Side steps in // bars: x3 D/B with Blue Tband   PATIENT EDUCATION: Discussed continued use of ice at home prn and completing new Rx from surgeon as instructed. We discussed current progress obtained, expectations following resolution of pain with Medrol dosepack, and ongoing POC pending new authorization.    *not today* Manual hip flexor stretch in R sidelying, gentle (hip to neutral); 2 x 30 sec Toe tapping; 2x10 alternating R/L with 12-inch step, no UE support; stance on Airex pad Forward step up to Airex pad; 1x10 surgical LE leading  Standing march with 5-lb ankle weights; 2 x 10  alternating, holding treadmill armrest Sit to stand: 1x6 and 1x4; from chair with 8-lb Goblet hold  Unipedal stance; 2 x 15 sec Alternating hip flexion with Tband; 2x10 with Red Tband  -added to HEP  Bridge 3x12:  Black Tband at distal femurs with good carryover in form/technique.  Butterfly stretch; 3 x 20 sec        PATIENT EDUCATION:  Education details: see above for patient education details Person educated: Patient Education method: Explanation and Demonstration Education comprehension: verbalized understanding   HOME EXERCISE PROGRAM: Access Code: [email or web address] URL: [email or web address] Date: 12/14/2022 Prepared by: Consuela Mimes  Exercises - Sit to Stand  - 1 x daily - 7 x weekly - 2 sets - 10 reps - Supine Bridge with Resistance Band  - 1 x daily - 7 x weekly - 2 sets - 10 reps - Sidelying Hip Abduction  - 1 x daily - 7 x weekly - 2 sets - 10 reps - Standing 3-Way Kick  - 1 x daily - 7 x weekly - 2 sets - 10 reps     ASSESSMENT:   CLINICAL IMPRESSION:  PT reached out to referring surgeon's office regarding persisting pain over the previous week and difficulty with tolerance of weightbearing activity and resistance drills. Pt was given Rx for Medrol dosepack and MD made new PT referral for hip and low back. Pt has modest improvement in hip abduction MMT, but he does not tolerate loading quadriceps and hip flexors as well due to  recent pain. Improvement in symptoms is expected with use of steroid. With improvement in pain, pt will likely be able to tolerate progressive weightbearing activity and resistance drills better (with caveat of keeping loads moderate due to slight increase in risk of sprain/strain). Pt has not yet met FOTO or LEFS goal. Pt has met short-term goals already and has largely weaned from using AD. He uses SPC intermittently to offload painful hip. Pt does need further PT to address remaining deficits in hip and LE strength,  standing/load-bearing activity tolerance, and ability to complete functional closed-chain tasks e.g. stepping up/down and obstacle negotiation. Pending new referral, we can also utilize interventions for lumbar spine pending evaluation. Patient will benefit from continued skilled therapeutic intervention to address the remaining deficits in hip strength, mobility, and weightbearing tolerance as needed for improved function and ability to perform work duties.      OBJECTIVE IMPAIRMENTS: Abnormal gait, decreased activity tolerance, decreased balance, decreased endurance, decreased knowledge of condition, decreased knowledge of use of DME, decreased mobility, difficulty walking, decreased ROM, decreased strength, and dizziness.    ACTIVITY LIMITATIONS: carrying, lifting, bending, sitting, standing, squatting, sleeping, stairs, transfers, dressing, and locomotion level   PARTICIPATION LIMITATIONS: cleaning, shopping, community activity, occupation, and yard work   PERSONAL FACTORS:  Pt is impulsive and eager to get better so that he can return to work.   are also affecting patient's functional outcome.    REHAB POTENTIAL: Good   CLINICAL DECISION MAKING: Stable/uncomplicated   EVALUATION COMPLEXITY: Low     GOALS: Goals reviewed with patient? Yes   SHORT TERM GOALS: Target date: 12/22/2022 Pt will improve L hip ROM to 10 to 90 degrees without pain in order to promote normal gait and stability.  Baseline: 20 degree Hip felx and 0 deg Ext 12/04/22: 90 deg hip flex and 5 deg EXT 12/18/22: 90 deg hip flex and 10 deg hip EXT Goal status: ACHIEVED    2.  Pt will be able to ambulate 533ft safely with Cane without fall and pain . Baseline: 30 ft with SBA and heavy reliance on [facility].   12/04/22: Pt uses SPC for primary means of mobility in home and community at this time.  Goal status: ACHIEVED       LONG TERM GOALS: Target date: 01/26/2023   Pt will Improve TUG time to <10secs without SPC to  demonstrate decreased fall risk.  Baseline: 19 secs with SPC 12/04/22: 14.6 seconds with SPC 12/18/22: 9.5 sec Goal status: ACHIEVED   2.  Pt will Improve LEFS score to >65/80 to demonstrate improved QoL Baseline: 18/80 12/04/22: 20/80  12/18/22: 22/80 01/29/23: 25/80  03/15/23: 24/80 Goal status: ON-GOING   3.  Pt will be able to ambulate Independently  with LRAD without LOB and pain in order to return to community level mobility safely  Baseline: With [facility] and [facility].   12/04/22: ModI ambulation with SPC/LRAD with mild pain at longer distances, no LOB 12/18/22: Amb IND with SPC with no LOB Goal status: ACHIEVED    4.  Pt will score >50 on FOTO Baseline: 36/56 12/04/22: 35/56 12/18/22: 37/56 01/29/23: 43/56 03/15/23: 38/56 Goal status: IN PROGRESS   5.  Pt will demonstrate 4/5 L hip strength without pain to improve stability and safety with all functional mobility in order to return to work.  Baseline: 2/5 grossly    12/04/22: 4-/5 grossly 12/18/22: 4-/5 grossly 01/29/23: 4/5 for hip flexors/quads/HS, 3+/5 for hip ABD and EXT.  03/15/23: Remaining weakness in hip  flexors, quads, abductors, extensors.  Goal status: IN PROGRESS         PLAN:   PT FREQUENCY: 2x/week   PT DURATION: 4 weeks   PLANNED INTERVENTIONS: Therapeutic exercises, Therapeutic activity, Neuromuscular re-education, Balance training, Gait training, Patient/Family education, Self Care, and Joint mobilization   PLAN FOR NEXT SESSION: Progressive gait and stepping activity with gradual AD wean, progressive LE strengthening with emphasis on gluteal mm, hip flexors, quads. Modalities as needed for pain control.      Consuela Mimes, PT, DPT [phone] Physical Therapist- [facility]  03/15/2023, 3:12 PM

## 2023-03-20 ENCOUNTER — Encounter: Payer: Self-pay | Admitting: Physical Therapy

## 2023-03-21 ENCOUNTER — Encounter: Payer: Self-pay | Admitting: Physical Therapy

## 2023-03-27 ENCOUNTER — Ambulatory Visit: Payer: Worker's Compensation | Admitting: Physical Therapy

## 2023-03-27 ENCOUNTER — Encounter: Payer: Self-pay | Admitting: Physical Therapy

## 2023-03-27 VITALS — BP 146/92 | HR 82

## 2023-03-27 DIAGNOSIS — Z96642 Presence of left artificial hip joint: Secondary | ICD-10-CM

## 2023-03-27 DIAGNOSIS — R29898 Other symptoms and signs involving the musculoskeletal system: Secondary | ICD-10-CM

## 2023-03-27 DIAGNOSIS — R262 Difficulty in walking, not elsewhere classified: Secondary | ICD-10-CM

## 2023-03-27 DIAGNOSIS — R2689 Other abnormalities of gait and mobility: Secondary | ICD-10-CM

## 2023-03-27 NOTE — Therapy (Unsigned)
OUTPATIENT PHYSICAL THERAPY TREATMENT/RE-ASSESS   Patient Name: Christian Hughes MRN: [id number] DOB:06/23/1961, 62 y.o., male Today's Date: 03/27/2023   END OF SESSION:   PT End of Session - 03/27/23 1605     Visit Number 29    Number of Visits 35    Authorization Type Workers Compensation    Progress Note Due on Visit 20    PT Start Time 1603    PT Stop Time 1645    PT Time Calculation (min) 42 min    Activity Tolerance Patient tolerated treatment well;Patient limited by pain    Behavior During Therapy WFL for tasks assessed/performed;Impulsive               Past Medical History:  Diagnosis Date   Engages in vaping    H/O ventral hernia    S/p of repair   Hypertension Early 2010's   Marijuana use    Orthostatic dizziness 10/08/2022   Past Surgical History:  Procedure Laterality Date   HIP ARTHROPLASTY Left 10/07/2022   Procedure: ARTHROPLASTY BIPOLAR HIP (HEMIARTHROPLASTY);  Surgeon: Juanell Fairly, MD;  Location: [facility] ORS;  Service: Orthopedics;  Laterality: Left;   JOINT REPLACEMENT  10/06/2022   Left Hip Replacement due fall while at work   Ventral hernia repair     Patient Active Problem List   Diagnosis Date Noted   Osteopenia determined by x-ray 02/12/2023   Hyperlipidemia 01/10/2023   Prediabetes 01/10/2023   Primary hypertension 01/10/2023   Left ventricular hypertrophy 01/10/2023   Abnormal EKG 10/06/2022   Bone lesion_sclerotic lesions in the right iliac bone 10/06/2022    PCP: Dewaine Oats MD  REFERRING PROVIDER: Juanell Fairly MD   REFERRING DIAG: [id number] (ICD-10-CM) - Presence of left artificial hip joint   THERAPY DIAG:  History of left hip hemiarthroplasty  Weakness of left leg  Balance problems  Difficulty in walking, not elsewhere classified  Rationale for Evaluation and Treatment Rehabilitation  PERTINENT HISTORY: From initial eval: Christian Hughes is a 62 y.o. male without significant past medical history except for  occasional vaping amd marijuana use, who presents with fall and left hip pain.  Pt states that he slipped and fell at work at about 11:00 AM.  Denies loss of consciousness.  He injured his left hip, causing left hip pain, which is constant, sharp, severe, nonradiating, aggravated by movement.  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    "My mom took care of me after my hip surgery. I had therapy at home. I returned to my home alone after my first follow up appointment. I have pain in my buttocks, groin and the corner of my buttocks of about 2 to 3/10. I have high pain tolerance. I am unable to walk normal and I feel weak. I began to use this cane on my own after my PT at home stopped.  I can't wait to go to work because, I love to work."   PAIN:  Are you having pain? Yes: Pain location: 2-3/10 Pain description: stabbing Aggravating factors: Moving, walking, WB   Relieving factors: rest, OTC meds, ice    WEIGHT BEARING RESTRICTIONS: Yes:  WBAT   FALLS:  Has patient fallen in last 6 months? Yes. 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 Pt negotiates some small staircases in town. Pt has historically taken 24-packs of water bottles to his work for customers.    PLOF: Independent   PATIENT GOALS: "I want to return to work without hurting and falling as soon as I can."   PRECAUTIONS: Posterior hip precautions      OBJECTIVE: (objective measures completed at initial evaluation unless otherwise dated)   PATIENT  SURVEYS:  LEFS 18/80 FOTO 36        PALPATION: Pain in L sacral border, Iliac crest, gluteus medius, TFL, and greater trochanter     LOWER EXTREMITY ROM:    Active ROM Right eval Left eval  Hip flexion WFL 20 deg with pain  Hip extension [facility]    Hip abduction WFL 20 with pain   Hip adduction [facility]    Hip internal rotation [facility]    Hip external rotation [facility]    Knee flexion [facility]  Knee extension [facility]  Ankle dorsiflexion [facility]  Ankle plantarflexion [facility]  Ankle inversion [facility]  Ankle eversion WFL WFL   (Blank rows = not tested)   LOWER EXTREMITY MMT:   MMT Right eval Left eval Left 12/04/22 Left 12/18/22 Left 01/29/23 Left 03/15/23  Hip flexion WFL 2+ 4* 4- 4 4*  Hip extension WFL 2 3+  3+* 3+*  Hip abduction WFl 2 4-  3+* 4*  Hip adduction WFl        Hip internal rotation [facility]        Hip external rotation [facility]        Knee flexion WFL 3- 3+ 4- 4 4+*  Knee extension WFl 3 4+ 4* 4* 4-*  Ankle dorsiflexion WFL 3+      Ankle plantarflexion WFL 4      Ankle inversion WFL 4      Ankle eversion WFL 3-       (Blank rows = not tested) Pt's L ankle remains inverted 10 degrees since the Surgery.     FUNCTIONAL TESTS (INITIAL EVALUATION) 30 seconds chair stand test: 5 reps completed Timed up and go (TUG): 19secs 10 meter walk test: 12secs= 1,2 m/secs             Balance Test : SL standing unable, Modified tandem 10 secs, Tandem Unable, Rhomberg with EO 5 secs and unable with EC.  GAIT: Distance walked: 30 ft  Assistive device utilized: Single point cane Level of assistance: SBA Comments: Pt unsteady on feet with severe antalgic gait, decreased loading response, absent heel strike and absent toe off gait with uneven stride and decreased speed. Heavy reliance on the [facility]   -----  03/27/23  Posture: Lumbar lordosis: WNL Iliac crest height: Equal bilaterally Lumbar lateral shift: Mild shift to R to offload LLE  AROM AROM (Normal range in degrees) AROM   Lumbar    Flexion (65) 75% (stopped at 90 deg hip flexion)  Extension (30) 50%  Right lateral flexion (25) 100%  Left lateral flexion (25) 100%  Right rotation (30) 75%  Left rotation (30) 75%      (* = pain; Blank rows = not tested)   Sensation Grossly intact to light touch throughout bilateral LEs as determined by testing dermatomes L2-S2.  Proprioception, stereognosis, and hot/cold testing deferred on this date.   Special Tests Lumbar Radiculopathy and Discogenic: Centralization and Peripheralization (SN 92, -LR 0.12): Not examined Slump (SN 83, -LR 0.32): R: Negative L: Negative SLR (SN 92, -LR 0.29): R: Negative L:  Negative  Facet Joint: Extension-Rotation (SN 100, -LR 0.0): R: Negative L: Positive  Lumbar Foraminal Stenosis: Lumbar quadrant (SN 70): R: Negative L: Positive     TODAY'S TREATMENT:                                                                                                                              DATE: 03/27/2023    L hip hemiarthroplasty following L femoral neck fracture (DOS: 10/07/22)   SUBJECTIVE STATEMENT:  Pt reports 3-3.5/10 pain today with no improvement following Medrol dosepack. Patient reports pain along medial groin region. Patient reports no numbness/tingling recently. Patient reports no night pain. Patient reports no changes in bathroom habits.    Today's Vitals   03/27/23 1606 03/27/23 1608  BP: (!) 166/100 (!) 153/93  Pulse: 82      There is no height or weight on file to calculate BMI.     Therapeutic Exercise - for improved soft tissue flexibility and extensibility as needed for ROM, improved strength as needed to improve performance of CKC activities/functional movements  *Re-assessment performed, see above  Nu-Step L3. Seat at 12 to maintain posterior hip flexion precautions. 5 min, for warm up    Prone hip extension, alternating; 2x10 alt Prone knee bend, dynamic stretch; 2x10, 1 sec  Glute bridge with 10-lb ankle  weight on pelvis; 2x8     Cold pack (unbilled) - for anti-inflammatory and analgesic effect as needed for reduced pain and improved ability to participate in active PT intervention, along L anterior in supine with bolster under knees for relaxation, x 5 minutes     PATIENT EDUCATION: We discussed limited pain provocation today with lower limb tension testing/radiculopathy testing. We reviewed current impairments and reviewed updated home program. We discussed continued POC and prognosis.    *not today* Side steps in // bars: x3 D/B with Blue Tband Thomas hip flexor stretch; reviewed Butterfly stretch in supine; reviewed Manual hip flexor stretch in R sidelying, gentle (hip to neutral); 2 x 30 sec Toe tapping; 2x10 alternating R/L with 12-inch step, no UE support; stance on Airex pad Forward step up to Airex pad; 1x10 surgical LE leading  Standing march with 5-lb ankle weights; 2 x 10 alternating, holding treadmill armrest Sit to stand: 1x6 and 1x4; from chair with 8-lb Goblet hold  Unipedal stance; 2 x 15 sec Alternating hip flexion with Tband; 2x10 with Red Tband  -added to HEP Bridge 3x12:  Black Tband at distal femurs with good carryover in form/technique.  Butterfly stretch; 3 x 20 sec        PATIENT EDUCATION:  Education details: see above for patient education details Person educated: Patient Education method: Explanation and  Demonstration Education comprehension: verbalized understanding   HOME EXERCISE PROGRAM: Access Code: [email or web address] URL: [email or web address] Date: 12/14/2022 Prepared by: Consuela Mimes  Exercises - Sit to Stand  - 1 x daily - 7 x weekly - 2 sets - 10 reps - Supine Bridge with Resistance Band  - 1 x daily - 7 x weekly - 2 sets - 10 reps - Sidelying Hip Abduction  - 1 x daily - 7 x weekly - 2 sets - 10 reps - Standing 3-Way Kick  - 1 x daily - 7 x weekly - 2 sets - 10 reps     ASSESSMENT:   CLINICAL IMPRESSION:  Patient has  completed Medrol dosepack/steroid taper following Rx from surgeon. His new referral includes lumbar spine. We completed additional assessment today to check for deficits related to lumbar spine and provocation tests for L-spine. Testing may be limited by pt completing steroid taper; lower limb tension tests including SLR and SLUMP are negative today. Pt does have anterior hip and thigh pain with prone knee bend; no back or iliolumbar pain with prone knee bend. No recent paresthesias to date and pt has no red flags at this time. Pt does have marked hip abductor and extensor weakness remaining. He has notable quad and hip flexor tightness. No notable low back/iliolumbar pain. Pt does report low-level pain following Medrol dosepack, but he is unsure of treatment effect of steroid taper. We will continue work on addressing the aforementioned deficits and progress with ambulatory/CKC drills as tolerated for pt to improve ability to complete community-level gait and negotiate work facility. Patient will benefit from continued skilled therapeutic intervention to address the remaining deficits in hip strength, mobility, and weightbearing tolerance as needed for improved function and ability to perform work duties.      OBJECTIVE IMPAIRMENTS: Abnormal gait, decreased activity tolerance, decreased balance, decreased endurance, decreased knowledge of condition, decreased knowledge of use of DME, decreased mobility, difficulty walking, decreased ROM, decreased strength, and dizziness.    ACTIVITY LIMITATIONS: carrying, lifting, bending, sitting, standing, squatting, sleeping, stairs, transfers, dressing, and locomotion level   PARTICIPATION LIMITATIONS: cleaning, shopping, community activity, occupation, and yard work   PERSONAL FACTORS:  Pt is impulsive and eager to get better so that he can return to work.   are also affecting patient's functional outcome.    REHAB POTENTIAL: Good   CLINICAL DECISION MAKING:  Stable/uncomplicated   EVALUATION COMPLEXITY: Low     GOALS: Goals reviewed with patient? Yes   SHORT TERM GOALS: Target date: 12/22/2022 Pt will improve L hip ROM to 10 to 90 degrees without pain in order to promote normal gait and stability.  Baseline: 20 degree Hip felx and 0 deg Ext 12/04/22: 90 deg hip flex and 5 deg EXT 12/18/22: 90 deg hip flex and 10 deg hip EXT Goal status: ACHIEVED    2.  Pt will be able to ambulate 538ft safely with Cane without fall and pain . Baseline: 30 ft with SBA and heavy reliance on [facility].   12/04/22: Pt uses SPC for primary means of mobility in home and community at this time.  Goal status: ACHIEVED       LONG TERM GOALS: Target date: 01/26/2023   Pt will Improve TUG time to <10secs without SPC to demonstrate decreased fall risk.  Baseline: 19 secs with SPC 12/04/22: 14.6 seconds with SPC 12/18/22: 9.5 sec Goal status: ACHIEVED   2.  Pt will Improve LEFS score to >65/80 to demonstrate improved QoL Baseline: 18/80 12/04/22:  20/80  12/18/22: 22/80 01/29/23: 25/80  03/15/23: 24/80 Goal status: ON-GOING   3.  Pt will be able to ambulate Independently  with LRAD without LOB and pain in order to return to community level mobility safely  Baseline: With [facility] and [facility].   12/04/22: ModI ambulation with SPC/LRAD with mild pain at longer distances, no LOB 12/18/22: Amb IND with SPC with no LOB Goal status: ACHIEVED    4.  Pt will score >50 on FOTO Baseline: 36/56 12/04/22: 35/56 12/18/22: 37/56 01/29/23: 43/56 03/15/23: 38/56 Goal status: IN PROGRESS   5.  Pt will demonstrate 4/5 L hip strength without pain to improve stability and safety with all functional mobility in order to return to work.  Baseline: 2/5 grossly    12/04/22: 4-/5 grossly 12/18/22: 4-/5 grossly 01/29/23: 4/5 for hip flexors/quads/HS, 3+/5 for hip ABD and EXT.  03/15/23: Remaining weakness in hip flexors, quads, abductors, extensors.  Goal status: IN PROGRESS         PLAN:   PT FREQUENCY:  2x/week   PT DURATION: 4 weeks   PLANNED INTERVENTIONS: Therapeutic exercises, Therapeutic activity, Neuromuscular re-education, Balance training, Gait training, Patient/Family education, Self Care, and Joint mobilization   PLAN FOR NEXT SESSION: Hip extensor and abductor strengthening with integration of trunk stabilization work, progressive weightbearing activity, graded introduction of closed-chain loading.      Consuela Mimes, PT, DPT [phone] Physical Therapist- [facility]  03/27/2023, 4:32 PM

## 2023-03-29 ENCOUNTER — Encounter: Payer: Self-pay | Admitting: Physical Therapy

## 2023-03-29 ENCOUNTER — Ambulatory Visit: Payer: Worker's Compensation | Admitting: Physical Therapy

## 2023-03-29 VITALS — BP 211/118

## 2023-03-29 DIAGNOSIS — R2689 Other abnormalities of gait and mobility: Secondary | ICD-10-CM

## 2023-03-29 DIAGNOSIS — Z96642 Presence of left artificial hip joint: Secondary | ICD-10-CM

## 2023-03-29 DIAGNOSIS — R262 Difficulty in walking, not elsewhere classified: Secondary | ICD-10-CM

## 2023-03-29 DIAGNOSIS — R29898 Other symptoms and signs involving the musculoskeletal system: Secondary | ICD-10-CM

## 2023-03-29 NOTE — Therapy (Signed)
OUTPATIENT PHYSICAL THERAPY TREATMENT (GOAL UPDATE DEFERRED DUE TO MEDICAL URGENCY)   Patient Name: Christian Hughes MRN: [id number] DOB:09/08/1960, 62 y.o., male Today's Date: 03/29/2023   END OF SESSION:   PT End of Session - 03/29/23 1604     Visit Number 30    Number of Visits 35    Authorization Type Workers Compensation    Progress Note Due on Visit 20    PT Start Time 1603    PT Stop Time 1645    PT Time Calculation (min) 42 min    Activity Tolerance Patient tolerated treatment well;Patient limited by pain    Behavior During Therapy WFL for tasks assessed/performed;Impulsive                Past Medical History:  Diagnosis Date   Engages in vaping    H/O ventral hernia    S/p of repair   Hypertension Early 2010's   Marijuana use    Orthostatic dizziness 10/08/2022   Past Surgical History:  Procedure Laterality Date   HIP ARTHROPLASTY Left 10/07/2022   Procedure: ARTHROPLASTY BIPOLAR HIP (HEMIARTHROPLASTY);  Surgeon: Juanell Fairly, MD;  Location: [facility] ORS;  Service: Orthopedics;  Laterality: Left;   JOINT REPLACEMENT  10/06/2022   Left Hip Replacement due fall while at work   Ventral hernia repair     Patient Active Problem List   Diagnosis Date Noted   Osteopenia determined by x-ray 02/12/2023   Hyperlipidemia 01/10/2023   Prediabetes 01/10/2023   Primary hypertension 01/10/2023   Left ventricular hypertrophy 01/10/2023   Abnormal EKG 10/06/2022   Bone lesion_sclerotic lesions in the right iliac bone 10/06/2022    PCP: Dewaine Oats MD  REFERRING PROVIDER: Juanell Fairly MD   REFERRING DIAG: [phone] (ICD-10-CM) - Presence of left artificial hip joint   THERAPY DIAG:  History of left hip hemiarthroplasty  Weakness of left leg  Balance problems  Difficulty in walking, not elsewhere classified  Rationale for Evaluation and Treatment Rehabilitation  PERTINENT HISTORY: From initial eval: Christian Hughes is a 62 y.o. male without significant  past medical history except for occasional vaping amd marijuana use, who presents with fall and left hip pain.  Pt states that he slipped and fell at work at about 11:00 AM.  Denies loss of consciousness.  He injured his left hip, causing left hip pain, which is constant, sharp, severe, nonradiating, aggravated by movement.  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    "My mom took care of me after my hip surgery. I had therapy at home. I returned to my home alone after my first follow up appointment. I have pain in my buttocks, groin and the corner of my buttocks of about 2 to 3/10. I have high pain tolerance. I am unable to walk normal and I feel weak. I began to use this cane on my own after my PT at home stopped.  I can't wait to go to work because, I love to work."   PAIN:  Are you having pain? Yes: Pain location: 2-3/10 Pain description: stabbing Aggravating factors: Moving, walking, WB   Relieving factors: rest, OTC meds, ice    WEIGHT BEARING RESTRICTIONS: Yes:  WBAT   FALLS:  Has patient fallen in last 6 months? Yes. Number of falls once once 3 months ago   LIVING ENVIRONMENT: Lives with: lives alone Lives in: Mobile home  Stairs: Yes: External: 4 steps; can reach both Has following equipment at home: Single point cane   OCCUPATION: Works in the office for [employer]. 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 Pt negotiates some small staircases in town. Pt has historically taken 24-packs of water bottles to his work for customers.    PLOF: Independent   PATIENT GOALS: "I want to return to work without hurting and falling as soon as I can."   PRECAUTIONS: Posterior hip precautions      OBJECTIVE: (objective measures completed at initial evaluation unless  otherwise dated)   PATIENT SURVEYS:  LEFS 18/80 FOTO 36        PALPATION: Pain in L sacral border, Iliac crest, gluteus medius, TFL, and greater trochanter     LOWER EXTREMITY ROM:    Active ROM Right eval Left eval  Hip flexion WFL 20 deg with pain  Hip extension [facility]    Hip abduction WFL 20 with pain   Hip adduction [facility]    Hip internal rotation [facility]    Hip external rotation [facility]    Knee flexion [facility]  Knee extension [facility]  Ankle dorsiflexion [facility]  Ankle plantarflexion [facility]  Ankle inversion [facility]  Ankle eversion WFL WFL   (Blank rows = not tested)   LOWER EXTREMITY MMT:   MMT Right eval Left eval Left 12/04/22 Left 12/18/22 Left 01/29/23 Left 03/15/23  Hip flexion WFL 2+ 4* 4- 4 4*  Hip extension WFL 2 3+  3+* 3+*  Hip abduction WFl 2 4-  3+* 4*  Hip adduction WFl        Hip internal rotation [facility]        Hip external rotation [facility]        Knee flexion WFL 3- 3+ 4- 4 4+*  Knee extension WFl 3 4+ 4* 4* 4-*  Ankle dorsiflexion WFL 3+      Ankle plantarflexion WFL 4      Ankle inversion WFL 4      Ankle eversion WFL 3-       (Blank rows = not tested) Pt's L ankle remains inverted 10 degrees since the Surgery.     FUNCTIONAL TESTS (INITIAL EVALUATION) 30 seconds chair stand test: 5 reps completed Timed up and go (TUG): 19secs 10 meter walk test: 12secs= 1,2 m/secs             Balance Test : SL standing unable, Modified tandem 10 secs, Tandem Unable, Rhomberg with EO 5 secs and unable with EC.  GAIT: Distance walked: 30 ft  Assistive device utilized: Single point cane Level of assistance: SBA Comments: Pt unsteady on feet with severe antalgic gait, decreased loading response, absent heel strike and absent toe off gait with uneven stride and decreased speed. Heavy reliance on the [facility]   -----  03/27/23  Posture: Lumbar lordosis: WNL Iliac crest height: Equal bilaterally Lumbar lateral shift: Mild shift to R to offload LLE  AROM AROM (Normal range in  degrees) AROM   Lumbar   Flexion (65) 75% (stopped at 90 deg hip flexion)  Extension (30) 50%  Right lateral flexion (25) 100%  Left lateral flexion (25) 100%  Right rotation (30) 75%  Left rotation (30) 75%      (* = pain; Blank rows = not tested)   Sensation Grossly intact to light touch throughout  bilateral LEs as determined by testing dermatomes L2-S2. Proprioception, stereognosis, and hot/cold testing deferred on this date.   Special Tests Lumbar Radiculopathy and Discogenic: Centralization and Peripheralization (SN 92, -LR 0.12): Not examined Slump (SN 83, -LR 0.32): R: Negative L: Negative SLR (SN 92, -LR 0.29): R: Negative L:  Negative  Facet Joint: Extension-Rotation (SN 100, -LR 0.0): R: Negative L: Positive  Lumbar Foraminal Stenosis: Lumbar quadrant (SN 70): R: Negative L: Positive     TODAY'S TREATMENT:                                                                                                                              DATE: 03/29/2023    L hip hemiarthroplasty following L femoral neck fracture (DOS: 10/07/22)   SUBJECTIVE STATEMENT:  Pt reports GI upset after eating [place] food earlier today. Patient reports difficulty sleeping with pt being concerned about mother's health issues. Patient reports that he thought he might not come today due to not feeling well.    Today's Vitals   03/29/23 1608 03/29/23 1609 03/29/23 1610  BP: (!) 218/104 (!) 204/119 (!) 211/118      There is no height or weight on file to calculate BMI.    Exercise is held today due to severe high blood pressure    PATIENT EDUCATION: We informed patient of hypertensive urgency requiring immediate medical attention. We elected to hold on physical therapy intervention today and pt to f/u with emergency medical services for management of hypertensive urgency. Pt encouraged to get ride for receiving medical attention versus driving; pt elects to drive to his home 10 min away to  go with his significant other.     *not today* Side steps in // bars: x3 D/B with Blue Tband Thomas hip flexor stretch; reviewed Butterfly stretch in supine; reviewed Manual hip flexor stretch in R sidelying, gentle (hip to neutral); 2 x 30 sec Toe tapping; 2x10 alternating R/L with 12-inch step, no UE support; stance on Airex pad Forward step up to Airex pad; 1x10 surgical LE leading  Standing march with 5-lb ankle weights; 2 x 10 alternating, holding treadmill armrest Sit to stand: 1x6 and 1x4; from chair with 8-lb Goblet hold  Unipedal stance; 2 x 15 sec Alternating hip flexion with Tband; 2x10 with Red Tband  -added to HEP Bridge 3x12:  Black Tband at distal femurs with good carryover in form/technique.  Butterfly stretch; 3 x 20 sec        PATIENT EDUCATION:  Education details: see above for patient education details Person educated: Patient Education method: Explanation and Demonstration Education comprehension: verbalized understanding   HOME EXERCISE PROGRAM: Access Code: [email or web address] URL: [email or web address] Date: 12/14/2022 Prepared by: Consuela Mimes  Exercises - Sit to Stand  - 1 x daily - 7 x weekly - 2 sets - 10 reps - Supine Bridge with Resistance Band  - 1 x daily - 7 x weekly - 2 sets - 10 reps -  Sidelying Hip Abduction  - 1 x daily - 7 x weekly - 2 sets - 10 reps - Standing 3-Way Kick  - 1 x daily - 7 x weekly - 2 sets - 10 reps     ASSESSMENT:   CLINICAL IMPRESSION:  Patient has high blood pressure consistent with urgent hypertensive crisis. Pt has no signs of stroke or other significant Sx aside from mild headache. We recommended immediate medical attention. Pt needs to hold on active exercise today with current BP levels. We discussed pt getting ride to ED or seeking medical transport; pt elects to go to his home about 10 mins away and meet with his significant other and then seek medical attention. Patient will benefit from continued  skilled therapeutic intervention pending adequate blood pressure control to allow for safe exercise.      OBJECTIVE IMPAIRMENTS: Abnormal gait, decreased activity tolerance, decreased balance, decreased endurance, decreased knowledge of condition, decreased knowledge of use of DME, decreased mobility, difficulty walking, decreased ROM, decreased strength, and dizziness.    ACTIVITY LIMITATIONS: carrying, lifting, bending, sitting, standing, squatting, sleeping, stairs, transfers, dressing, and locomotion level   PARTICIPATION LIMITATIONS: cleaning, shopping, community activity, occupation, and yard work   PERSONAL FACTORS:  Pt is impulsive and eager to get better so that he can return to work.   are also affecting patient's functional outcome.    REHAB POTENTIAL: Good   CLINICAL DECISION MAKING: Stable/uncomplicated   EVALUATION COMPLEXITY: Low     GOALS: Goals reviewed with patient? Yes   SHORT TERM GOALS: Target date: 12/22/2022 Pt will improve L hip ROM to 10 to 90 degrees without pain in order to promote normal gait and stability.  Baseline: 20 degree Hip felx and 0 deg Ext 12/04/22: 90 deg hip flex and 5 deg EXT 12/18/22: 90 deg hip flex and 10 deg hip EXT Goal status: ACHIEVED    2.  Pt will be able to ambulate 524ft safely with Cane without fall and pain . Baseline: 30 ft with SBA and heavy reliance on [facility].   12/04/22: Pt uses SPC for primary means of mobility in home and community at this time.  Goal status: ACHIEVED       LONG TERM GOALS: Target date: 01/26/2023   Pt will Improve TUG time to <10secs without SPC to demonstrate decreased fall risk.  Baseline: 19 secs with SPC 12/04/22: 14.6 seconds with SPC 12/18/22: 9.5 sec Goal status: ACHIEVED   2.  Pt will Improve LEFS score to >65/80 to demonstrate improved QoL Baseline: 18/80 12/04/22: 20/80  12/18/22: 22/80 01/29/23: 25/80  03/15/23: 24/80 Goal status: ON-GOING   3.  Pt will be able to ambulate Independently  with  LRAD without LOB and pain in order to return to community level mobility safely  Baseline: With [facility] and [facility].   12/04/22: ModI ambulation with SPC/LRAD with mild pain at longer distances, no LOB 12/18/22: Amb IND with SPC with no LOB Goal status: ACHIEVED    4.  Pt will score >50 on FOTO Baseline: 36/56 12/04/22: 35/56 12/18/22: 37/56 01/29/23: 43/56 03/15/23: 38/56 Goal status: IN PROGRESS   5.  Pt will demonstrate 4/5 L hip strength without pain to improve stability and safety with all functional mobility in order to return to work.  Baseline: 2/5 grossly    12/04/22: 4-/5 grossly 12/18/22: 4-/5 grossly 01/29/23: 4/5 for hip flexors/quads/HS, 3+/5 for hip ABD and EXT.  03/15/23: Remaining weakness in hip flexors, quads, abductors, extensors.  Goal status: IN  PROGRESS         PLAN:   PT FREQUENCY: 2x/week   PT DURATION: 4 weeks   PLANNED INTERVENTIONS: Therapeutic exercises, Therapeutic activity, Neuromuscular re-education, Balance training, Gait training, Patient/Family education, Self Care, and Joint mobilization   PLAN FOR NEXT SESSION: PT to resume pending improved blood pressure control.  Hip extensor and abductor strengthening with integration of trunk stabilization work, progressive weightbearing activity, graded introduction of closed-chain loading.      Consuela Mimes, PT, DPT [phone] Physical Therapist- [facility]  03/29/2023, 4:10 PM

## 2023-03-30 NOTE — Telephone Encounter (Signed)
FYI. Appt?  KP

## 2023-04-03 ENCOUNTER — Encounter: Payer: Self-pay | Admitting: Physical Therapy

## 2023-04-04 ENCOUNTER — Ambulatory Visit: Payer: Worker's Compensation | Attending: Orthopedic Surgery | Admitting: Physical Therapy

## 2023-04-04 VITALS — BP 186/106 | HR 72

## 2023-04-04 DIAGNOSIS — R29898 Other symptoms and signs involving the musculoskeletal system: Secondary | ICD-10-CM

## 2023-04-04 DIAGNOSIS — R262 Difficulty in walking, not elsewhere classified: Secondary | ICD-10-CM

## 2023-04-04 DIAGNOSIS — R2689 Other abnormalities of gait and mobility: Secondary | ICD-10-CM

## 2023-04-04 DIAGNOSIS — Z96642 Presence of left artificial hip joint: Secondary | ICD-10-CM

## 2023-04-04 NOTE — Therapy (Signed)
OUTPATIENT PHYSICAL THERAPY NOTE/(GOAL UPDATE DEFERRED DUE TO HYPERTENSIVE  URGENCY)    Patient Name: Christian Hughes MRN: [id number] DOB:[birth year], 62 y.o., male Today's Date: 04/04/2023   END OF SESSION:   PT End of Session - 04/04/23 1527     Visit Number 30    Number of Visits 35    Authorization Type Workers Compensation    Progress Note Due on Visit 20    PT Start Time 1503    PT Stop Time 1518    PT Time Calculation (min) 15 min    Activity Tolerance Patient tolerated treatment well;Patient limited by pain    Behavior During Therapy WFL for tasks assessed/performed;Impulsive                Past Medical History:  Diagnosis Date   Engages in vaping    H/O ventral hernia    S/p of repair   Hypertension Early 2010's   Marijuana use    Orthostatic dizziness 10/08/2022   Past Surgical History:  Procedure Laterality Date   HIP ARTHROPLASTY Left 10/07/2022   Procedure: ARTHROPLASTY BIPOLAR HIP (HEMIARTHROPLASTY);  Surgeon: Juanell Fairly, MD;  Location: [facility] ORS;  Service: Orthopedics;  Laterality: Left;   JOINT REPLACEMENT  10/06/2022   Left Hip Replacement due fall while at work   Ventral hernia repair     Patient Active Problem List   Diagnosis Date Noted   Osteopenia determined by x-ray 02/12/2023   Hyperlipidemia 01/10/2023   Prediabetes 01/10/2023   Primary hypertension 01/10/2023   Left ventricular hypertrophy 01/10/2023   Abnormal EKG 10/06/2022   Bone lesion_sclerotic lesions in the right iliac bone 10/06/2022    PCP: Dewaine Oats MD  REFERRING PROVIDER: Juanell Fairly MD   REFERRING DIAG: [phone] (ICD-10-CM) - Presence of left artificial hip joint   THERAPY DIAG:  History of left hip hemiarthroplasty  Weakness of left leg  Balance problems  Difficulty in walking, not elsewhere classified  Rationale for Evaluation and Treatment Rehabilitation  PERTINENT HISTORY: From initial eval: Christian Hughes is a 62 y.o. male without significant  past medical history except for occasional vaping amd marijuana use, who presents with fall and left hip pain.  Pt states that he slipped and fell at work at about 11:00 AM.  Denies loss of consciousness.  He injured his left hip, causing left hip pain, which is constant, sharp, severe, nonradiating, aggravated by movement.  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    "My mom took care of me after my hip surgery. I had therapy at home. I returned to my home alone after my first follow up appointment. I have pain in my buttocks, groin and the corner of my buttocks of about 2 to 3/10. I have high pain tolerance. I am unable to walk normal and I feel weak. I began to use this cane on my own after my PT at home stopped.  I can't wait to go to work because, I love to work."   PAIN:  Are you having pain? Yes: Pain location: 2-3/10 Pain description: stabbing Aggravating factors: Moving, walking, WB   Relieving factors: rest, OTC meds, ice    WEIGHT BEARING RESTRICTIONS: Yes:  WBAT   FALLS:  Has patient fallen in last 6 months? Yes. Number of falls once once 3 months ago   LIVING ENVIRONMENT: Lives with: lives alone Lives in: [place]  home Stairs: Yes: External: 4 steps; can reach both Has following equipment at home: Single point cane   OCCUPATION: Works in the office for [employer]. 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 Pt negotiates some small staircases in town. Pt has historically taken 24-packs of water bottles to his work for customers.    PLOF: Independent   PATIENT GOALS: "I want to return to work without hurting and falling as soon as I can."   PRECAUTIONS: Posterior hip precautions      OBJECTIVE: (objective measures completed at initial evaluation unless  otherwise dated)   PATIENT SURVEYS:  LEFS 18/80 FOTO 36        PALPATION: Pain in L sacral border, Iliac crest, gluteus medius, TFL, and greater trochanter     LOWER EXTREMITY ROM:    Active ROM Right eval Left eval  Hip flexion WFL 20 deg with pain  Hip extension [facility]    Hip abduction WFL 20 with pain   Hip adduction [facility]    Hip internal rotation [facility]    Hip external rotation [facility]    Knee flexion [facility]  Knee extension [facility]  Ankle dorsiflexion [facility]  Ankle plantarflexion [facility]  Ankle inversion [facility]  Ankle eversion WFL WFL   (Blank rows = not tested)   LOWER EXTREMITY MMT:   MMT Right eval Left eval Left 12/04/22 Left 12/18/22 Left 01/29/23 Left 03/15/23  Hip flexion WFL 2+ 4* 4- 4 4*  Hip extension WFL 2 3+  3+* 3+*  Hip abduction WFl 2 4-  3+* 4*  Hip adduction WFl        Hip internal rotation [facility]        Hip external rotation [facility]        Knee flexion WFL 3- 3+ 4- 4 4+*  Knee extension WFl 3 4+ 4* 4* 4-*  Ankle dorsiflexion WFL 3+      Ankle plantarflexion WFL 4      Ankle inversion WFL 4      Ankle eversion WFL 3-       (Blank rows = not tested) Pt's L ankle remains inverted 10 degrees since the Surgery.     FUNCTIONAL TESTS (INITIAL EVALUATION) 30 seconds chair stand test: 5 reps completed Timed up and go (TUG): 19secs 10 meter walk test: 12secs= 1,2 m/secs             Balance Test : SL standing unable, Modified tandem 10 secs, Tandem Unable, Rhomberg with EO 5 secs and unable with EC.  GAIT: Distance walked: 30 ft  Assistive device utilized: Single point cane Level of assistance: SBA Comments: Pt unsteady on feet with severe antalgic gait, decreased loading response, absent heel strike and absent toe off gait with uneven stride and decreased speed. Heavy reliance on the [facility]   -----  03/27/23  Posture: Lumbar lordosis: WNL Iliac crest height: Equal bilaterally Lumbar lateral shift: Mild shift to R to offload LLE  AROM AROM (Normal range in  degrees) AROM   Lumbar   Flexion (65) 75% (stopped at 90 deg hip flexion)  Extension (30) 50%  Right lateral flexion (25) 100%  Left lateral flexion (25) 100%  Right rotation (30) 75%  Left rotation (30) 75%      (* = pain; Blank rows = not tested)   Sensation Grossly intact to light touch  throughout bilateral LEs as determined by testing dermatomes L2-S2. Proprioception, stereognosis, and hot/cold testing deferred on this date.   Special Tests Lumbar Radiculopathy and Discogenic: Centralization and Peripheralization (SN 92, -LR 0.12): Not examined Slump (SN 83, -LR 0.32): R: Negative L: Negative SLR (SN 92, -LR 0.29): R: Negative L:  Negative  Facet Joint: Extension-Rotation (SN 100, -LR 0.0): R: Negative L: Positive  Lumbar Foraminal Stenosis: Lumbar quadrant (SN 70): R: Negative L: Positive     TODAY'S TREATMENT:                                                                                                                              DATE: 04/04/2023    L hip hemiarthroplasty following L femoral neck fracture (DOS: 10/07/22)   SUBJECTIVE STATEMENT:  Pt reports no significant visual changes, HA, dizziness, or palpations at arrival to PT. He reports continued monitoring of BP at home. Pt followed up with his PCP versus seeking emergency medical attention. Pt and his significant other are continuing to monitor BP at home with automatic cuff.    Today's Vitals   04/04/23 1504 04/04/23 1507 04/04/23 1512  BP: (!) 213/104 (!) 206/101 (!) 186/106  Pulse: 66 72   SpO2: 98% 98%       There is no height or weight on file to calculate BMI.       Exercise is held today due to hypertensive urgency       PATIENT EDUCATION: We informed patient of hypertensive urgency requiring immediate medical attention. We elected to hold on physical therapy intervention today and pt given recommendation to seek urgent medical care. Pt informed of symptoms associated with hypertensive  emergency.        *not today* Side steps in // bars: x3 D/B with Blue Tband Thomas hip flexor stretch; reviewed Butterfly stretch in supine; reviewed Manual hip flexor stretch in R sidelying, gentle (hip to neutral); 2 x 30 sec Toe tapping; 2x10 alternating R/L with 12-inch step, no UE support; stance on Airex pad Forward step up to Airex pad; 1x10 surgical LE leading  Standing march with 5-lb ankle weights; 2 x 10 alternating, holding treadmill armrest Sit to stand: 1x6 and 1x4; from chair with 8-lb Goblet hold  Unipedal stance; 2 x 15 sec Alternating hip flexion with Tband; 2x10 with Red Tband             -added to HEP Bridge 3x12:  Black Tband at distal femurs with good carryover in form/technique.  Butterfly stretch; 3 x 20 sec       *not today* Side steps in // bars: x3 D/B with Blue Tband Thomas hip flexor stretch; reviewed Butterfly stretch in supine; reviewed Manual hip flexor stretch in R sidelying, gentle (hip to neutral); 2 x 30 sec Toe tapping; 2x10 alternating R/L with 12-inch step, no UE support; stance on Airex pad Forward step up to Airex pad; 1x10 surgical LE leading  Standing march  with 5-lb ankle weights; 2 x 10 alternating, holding treadmill armrest Sit to stand: 1x6 and 1x4; from chair with 8-lb Goblet hold  Unipedal stance; 2 x 15 sec Alternating hip flexion with Tband; 2x10 with Red Tband  -added to HEP Bridge 3x12:  Black Tband at distal femurs with good carryover in form/technique.  Butterfly stretch; 3 x 20 sec        PATIENT EDUCATION:  Education details: see above for patient education details Person educated: Patient Education method: Explanation and Demonstration Education comprehension: verbalized understanding   HOME EXERCISE PROGRAM: Access Code: [email or web address] URL: [email or web address] Date: 12/14/2022 Prepared by: Consuela Mimes  Exercises - Sit to Stand  - 1 x daily - 7 x weekly - 2 sets - 10 reps - Supine Bridge  with Resistance Band  - 1 x daily - 7 x weekly - 2 sets - 10 reps - Sidelying Hip Abduction  - 1 x daily - 7 x weekly - 2 sets - 10 reps - Standing 3-Way Kick  - 1 x daily - 7 x weekly - 2 sets - 10 reps     ASSESSMENT:   CLINICAL IMPRESSION:  Patient unfortunately still has high blood pressure readings contraindicating exercise. Patient has messaged his PCP regarding blood pressure readings. I discussed seeking immediate medical attention last visit and discussed this afternoon urgency of BP readings. We recommended seeking urgent attention for hypertensive urgency; pt elects to self-monitor and continue follow-up with PCP. Pt does need continued PT intervention; we will continue pending improved BP control.       OBJECTIVE IMPAIRMENTS: Abnormal gait, decreased activity tolerance, decreased balance, decreased endurance, decreased knowledge of condition, decreased knowledge of use of DME, decreased mobility, difficulty walking, decreased ROM, decreased strength, and dizziness.    ACTIVITY LIMITATIONS: carrying, lifting, bending, sitting, standing, squatting, sleeping, stairs, transfers, dressing, and locomotion level   PARTICIPATION LIMITATIONS: cleaning, shopping, community activity, occupation, and yard work   PERSONAL FACTORS:  Pt is impulsive and eager to get better so that he can return to work.   are also affecting patient's functional outcome.    REHAB POTENTIAL: Good   CLINICAL DECISION MAKING: Stable/uncomplicated   EVALUATION COMPLEXITY: Low     GOALS: Goals reviewed with patient? Yes   SHORT TERM GOALS: Target date: 12/22/2022 Pt will improve L hip ROM to 10 to 90 degrees without pain in order to promote normal gait and stability.  Baseline: 20 degree Hip felx and 0 deg Ext 12/04/22: 90 deg hip flex and 5 deg EXT 12/18/22: 90 deg hip flex and 10 deg hip EXT Goal status: ACHIEVED    2.  Pt will be able to ambulate 565ft safely with Cane without fall and pain . Baseline:  30 ft with SBA and heavy reliance on [facility].   12/04/22: Pt uses SPC for primary means of mobility in home and community at this time.  Goal status: ACHIEVED       LONG TERM GOALS: Target date: 01/26/2023   Pt will Improve TUG time to <10secs without SPC to demonstrate decreased fall risk.  Baseline: 19 secs with SPC 12/04/22: 14.6 seconds with SPC 12/18/22: 9.5 sec Goal status: ACHIEVED   2.  Pt will Improve LEFS score to >65/80 to demonstrate improved QoL Baseline: 18/80 12/04/22: 20/80  12/18/22: 22/80 01/29/23: 25/80  03/15/23: 24/80 Goal status: ON-GOING   3.  Pt will be able to ambulate Independently  with LRAD without LOB and pain in order to return to community  level mobility safely  Baseline: With [facility] and FWW.   12/04/22: ModI ambulation with SPC/LRAD with mild pain at longer distances, no LOB 12/18/22: Amb IND with SPC with no LOB Goal status: ACHIEVED    4.  Pt will score >50 on FOTO Baseline: 36/56 12/04/22: 35/56 12/18/22: 37/56 01/29/23: 43/56 03/15/23: 38/56 Goal status: IN PROGRESS   5.  Pt will demonstrate 4/5 L hip strength without pain to improve stability and safety with all functional mobility in order to return to work.  Baseline: 2/5 grossly    12/04/22: 4-/5 grossly 12/18/22: 4-/5 grossly 01/29/23: 4/5 for hip flexors/quads/HS, 3+/5 for hip ABD and EXT.  03/15/23: Remaining weakness in hip flexors, quads, abductors, extensors.  Goal status: IN PROGRESS         PLAN:   PT FREQUENCY: 2x/week   PT DURATION: 4 weeks   PLANNED INTERVENTIONS: Therapeutic exercises, Therapeutic activity, Neuromuscular re-education, Balance training, Gait training, Patient/Family education, Self Care, and Joint mobilization   PLAN FOR NEXT SESSION: Hip extensor and abductor strengthening with integration of trunk stabilization work, progressive weightbearing activity, graded introduction of closed-chain loading.  Continued PT pending safe BP control for exercise.      Consuela Mimes,  PT, DPT [phone] Physical Therapist- [facility]  04/04/2023, 3:35 PM

## 2023-04-05 ENCOUNTER — Ambulatory Visit (INDEPENDENT_AMBULATORY_CARE_PROVIDER_SITE_OTHER): Payer: Self-pay | Admitting: Physician Assistant

## 2023-04-05 VITALS — BP 158/96 | HR 72 | Temp 98.5°F | Ht 72.0 in | Wt 198.0 lb

## 2023-04-05 DIAGNOSIS — I10 Essential (primary) hypertension: Secondary | ICD-10-CM

## 2023-04-05 DIAGNOSIS — I160 Hypertensive urgency: Secondary | ICD-10-CM | POA: Insufficient documentation

## 2023-04-05 DIAGNOSIS — E7800 Pure hypercholesterolemia, unspecified: Secondary | ICD-10-CM

## 2023-04-05 MED ORDER — CLONIDINE HCL 0.1 MG PO TABS: 0.1000 mg | ORAL_TABLET | Freq: Three times a day (TID) | ORAL | 2 refills | Status: DC | PRN

## 2023-04-05 MED ORDER — ATORVASTATIN CALCIUM 10 MG PO TABS: 10.0000 mg | ORAL_TABLET | Freq: Every day | ORAL | 3 refills | Status: DC

## 2023-04-05 MED ORDER — CHLORTHALIDONE 25 MG PO TABS: 25.0000 mg | ORAL_TABLET | Freq: Every day | ORAL | 2 refills | Status: DC

## 2023-04-05 NOTE — Progress Notes (Signed)
Date:  04/05/2023   Name:  Christian Hughes   DOB:  1961-03-18   MRN:  [id number]   Chief Complaint: Hypertension  HPI [name] presents today for follow-up on HTN, last visit with me 02/06/2023.  Currently taking lisinopril 40 mg daily with good compliance.  He has recently had to miss PT sessions on 8/29 and 9/4 due to hypertensive urgency >200 systolic. Denies chest pain, SOB, headache, vision changes.   Joined by partner [name] who brings me a blood pressure log today with 2-3 daily readings since 03/30/23 using a wrist cuff. Most of the readings are in the 140-170 systolic range but BP is quite labile, with the lowest reading 108/66 and the highest 203/112.   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   Medication list has been reviewed and updated.  Current Meds  Medication Sig   chlorthalidone (HYGROTON) 25 MG tablet Take 1 tablet (25 mg total) by mouth daily.   cloNIDine (CATAPRES) 0.1 MG tablet Take 1 tablet (0.1 mg total) by mouth 3 (three) times daily as needed (For BP >170/110).   Ferrous Sulfate (IRON PO) Take by mouth daily.   lisinopril (ZESTRIL) 40 MG tablet Take 1 tablet (40 mg total) by mouth daily.   [DISCONTINUED] atorvastatin (LIPITOR) 10 MG tablet Take 1 tablet (10 mg total) by mouth daily.   [DISCONTINUED] methylPREDNISolone (MEDROL DOSEPAK) 4 MG TBPK tablet Take by mouth as directed.     Review of Systems  Constitutional:  Negative for fatigue and fever.  Respiratory:  Negative for chest tightness and shortness of breath.   Cardiovascular:  Negative for chest pain and palpitations.  Gastrointestinal:  Negative for abdominal pain.    Patient Active Problem List   Diagnosis Date Noted   Asymptomatic hypertensive urgency 04/05/2023   Osteopenia determined by x-ray 02/12/2023   Hyperlipidemia 01/10/2023   Prediabetes 01/10/2023   Primary hypertension 01/10/2023   Left ventricular hypertrophy 01/10/2023   Abnormal EKG  10/06/2022   Bone lesion_sclerotic lesions in the right iliac bone 10/06/2022    No Known Allergies   There is no immunization history on file for this patient.  Past Surgical History:  Procedure Laterality Date   HIP ARTHROPLASTY Left 10/07/2022   Procedure: ARTHROPLASTY BIPOLAR HIP (HEMIARTHROPLASTY);  Surgeon: Juanell Fairly, MD;  Location: [facility] ORS;  Service: Orthopedics;  Laterality: Left;   JOINT REPLACEMENT  10/06/2022   Left Hip Replacement due fall while at work   Ventral hernia repair      Social History   Tobacco Use   Smoking status: Never   Smokeless tobacco: Never   Tobacco comments:    Occasional vaping  Vaping Use   Vaping status: Never Used  Substance Use Topics   Alcohol use: Not Currently    Comment: Very seldom, special occasions.   Drug use: Not Currently    Types: Marijuana    History reviewed. No pertinent family history.      04/05/2023    4:08 PM 02/06/2023   11:22 AM 01/10/2023    2:06 PM  GAD 7 : Generalized Anxiety Score  Nervous, Anxious, on Edge 0 0 0  Control/stop worrying 0 0 0  Worry too much - different things 0 0 0  Trouble relaxing 0 0 0  Restless 0 0 0  Easily annoyed or irritable 0 0 0  Afraid - awful might happen 0 0 0  Total GAD 7 Score 0 0 0  Anxiety  Difficulty Not difficult at all Not difficult at all Not difficult at all       04/05/2023    4:08 PM 02/06/2023   11:22 AM 01/10/2023    2:06 PM  Depression screen PHQ 2/9  Decreased Interest 0 0 0  Down, Depressed, Hopeless 0 0 0  PHQ - 2 Score 0 0 0  Altered sleeping 0 0 0  Tired, decreased energy 0 0 0  Change in appetite 0 0 0  Feeling bad or failure about yourself  0 0 0  Trouble concentrating 0 0 0  Moving slowly or fidgety/restless 0 0 0  Suicidal thoughts 0 0 0  PHQ-9 Score 0 0 0  Difficult doing work/chores Not difficult at all Not difficult at all Not difficult at all    BP Readings from Last 3 Encounters:  04/05/23 (!) 158/96  04/04/23 (!) 186/106   03/29/23 (!) 211/118    Wt Readings from Last 3 Encounters:  04/05/23 198 lb (89.8 kg)  02/06/23 201 lb (91.2 kg)  01/10/23 202 lb (91.6 kg)    BP (!) 158/96 (BP Location: Left Arm, Patient Position: Sitting, Cuff Size: Large)   Pulse 72   Temp 98.5 F (36.9 C) (Oral)   Ht 6' (1.829 m)   Wt 198 lb (89.8 kg)   SpO2 96%   BMI 26.85 kg/m   Physical Exam Vitals and nursing note reviewed.  Constitutional:      Appearance: Normal appearance.  Neck:     Vascular: No carotid bruit.  Cardiovascular:     Rate and Rhythm: Normal rate and regular rhythm.     Heart sounds: No murmur heard.    No friction rub. No gallop.  Pulmonary:     Effort: Pulmonary effort is normal.     Breath sounds: Normal breath sounds.  Abdominal:     General: There is no distension.  Musculoskeletal:        General: Normal range of motion.  Skin:    General: Skin is warm and dry.  Neurological:     Mental Status: He is alert and oriented to person, place, and time.     Gait: Gait is intact.  Psychiatric:        Mood and Affect: Mood and affect normal.     Recent Labs     Component Value Date/Time   NA 141 01/11/2023 0914   K 4.4 01/11/2023 0914   CL 105 01/11/2023 0914   CO2 23 01/11/2023 0914   GLUCOSE 100 (H) 01/11/2023 0914   GLUCOSE 105 (H) 10/10/2022 0415   BUN 13 01/11/2023 0914   CREATININE 0.94 01/11/2023 0914   CALCIUM 9.7 01/11/2023 0914   PROT 6.7 01/11/2023 0914   ALBUMIN 4.1 01/11/2023 0914   AST 16 01/11/2023 0914   ALT 11 01/11/2023 0914   ALKPHOS 115 01/11/2023 0914   BILITOT <0.2 01/11/2023 0914   GFRNONAA >60 10/10/2022 0415    Lab Results  Component Value Date   WBC 8.8 01/11/2023   HGB 11.6 (L) 01/11/2023   HCT 36.0 (L) 01/11/2023   MCV 86 01/11/2023   PLT 333 01/11/2023   Lab Results  Component Value Date   HGBA1C 6.0 (H) 10/06/2022   Lab Results  Component Value Date   CHOL 223 (H) 10/06/2022   HDL 43 10/06/2022   LDLCALC 152 (H) 10/06/2022   TRIG  140 10/06/2022   CHOLHDL 5.2 10/06/2022   No results found for: "TSH"   Assessment and  Plan:  1. Primary hypertension Continue lisinopril 40 mg daily.  Add chlorthalidone 25 mg daily. Continue home BP monitoring. Arm cuff would be better than wrist cuff.  Once insured, consider workup for secondary HTN including renal doppler, urinary cortisol, plasma metanephrines. Will need hypertensive eye exam as well.  - chlorthalidone (HYGROTON) 25 MG tablet; Take 1 tablet (25 mg total) by mouth daily.  Dispense: 30 tablet; Refill: 2  2. Asymptomatic hypertensive urgency Add clonidine for as needed use when blood pressure >170/110.  - cloNIDine (CATAPRES) 0.1 MG tablet; Take 1 tablet (0.1 mg total) by mouth 3 (three) times daily as needed (For BP >170/110).  Dispense: 30 tablet; Refill: 2  3. Pure hypercholesterolemia Refill atorvastatin as below.  Given option for 90-day supply, but would like to keep at 30 tablet dispense for now. - atorvastatin (LIPITOR) 10 MG tablet; Take 1 tablet (10 mg total) by mouth daily.  Dispense: 30 tablet; Refill: 3   F/u PRN. Due to self-pay status, we will try to optimize care from a distance while minimizing patient expense. He is aware I am readily available by phone or MyChart.   Alvester Morin, PA-C, DMSc, Nutritionist [facility] [phone]

## 2023-04-09 ENCOUNTER — Ambulatory Visit: Payer: Worker's Compensation | Admitting: Physical Therapy

## 2023-04-09 ENCOUNTER — Encounter: Payer: Self-pay | Admitting: Physical Therapy

## 2023-04-09 NOTE — Therapy (Deleted)
OUTPATIENT PHYSICAL THERAPY TREATMENT AND PROGRESS NOTE   Dates of reporting period  03/15/23   to   04/09/23    Patient Name: Christian Hughes MRN: [id number] DOB:[birth year], 62 y.o., male Today's Date: 04/09/2023   END OF SESSION:       Past Medical History:  Diagnosis Date   Engages in vaping    H/O ventral hernia    S/p of repair   Hypertension Early 2010's   Marijuana use    Orthostatic dizziness 10/08/2022   Past Surgical History:  Procedure Laterality Date   HIP ARTHROPLASTY Left 10/07/2022   Procedure: ARTHROPLASTY BIPOLAR HIP (HEMIARTHROPLASTY);  Surgeon: Juanell Fairly, MD;  Location: [facility] ORS;  Service: Orthopedics;  Laterality: Left;   JOINT REPLACEMENT  10/06/2022   Left Hip Replacement due fall while at work   Ventral hernia repair     Patient Active Problem List   Diagnosis Date Noted   Asymptomatic hypertensive urgency 04/05/2023   Osteopenia determined by x-ray 02/12/2023   Hyperlipidemia 01/10/2023   Prediabetes 01/10/2023   Primary hypertension 01/10/2023   Left ventricular hypertrophy 01/10/2023   Abnormal EKG 10/06/2022   Bone lesion_sclerotic lesions in the right iliac bone 10/06/2022    PCP: Dewaine Oats MD  REFERRING PROVIDER: Juanell Fairly MD   REFERRING DIAG: [phone] (ICD-10-CM) - Presence of left artificial hip joint   THERAPY DIAG:  History of left hip hemiarthroplasty  Weakness of left leg  Balance problems  Difficulty in walking, not elsewhere classified  Rationale for Evaluation and Treatment Rehabilitation  PERTINENT HISTORY: From initial eval: Christian Hughes is a 62 y.o. male without significant past medical history except for occasional vaping amd marijuana use, who presents with fall and left hip pain.  Pt states that he slipped and fell at work at about 11:00 AM.  Denies loss of consciousness.  He injured his left hip, causing left hip pain, which is constant, sharp, severe, nonradiating, aggravated by movement.  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    "My mom took care of me after my hip surgery. I had therapy at home. I returned to my home alone after my first follow up appointment. I have pain in my buttocks, groin and the corner of my buttocks of about 2 to 3/10. I have high pain tolerance. I am unable to walk normal and I feel weak. I began to use this cane on my own after my PT at home stopped.  I can't wait to go to work because, I love to work."   PAIN:  Are you having pain? Yes: Pain location: 2-3/10 Pain description: stabbing Aggravating factors: Moving, walking, WB   Relieving factors: rest, OTC meds, ice    WEIGHT BEARING RESTRICTIONS: Yes:  WBAT   FALLS:  Has patient fallen in last 6 months? Yes. 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 Pt negotiates some small staircases in town. Pt has historically taken 24-packs of water bottles to his work for customers.  PLOF: Independent   PATIENT GOALS: "I want to return to work without hurting and falling as soon as I can."   PRECAUTIONS: Posterior hip precautions      OBJECTIVE: (objective measures completed at initial evaluation unless otherwise dated)   PATIENT SURVEYS:  LEFS 18/80 FOTO 36        PALPATION: Pain in L sacral border, Iliac crest, gluteus medius, TFL, and greater trochanter     LOWER EXTREMITY ROM:    Active ROM Right eval Left eval  Hip flexion WFL 20 deg with pain  Hip extension [facility]    Hip abduction WFL 20 with pain   Hip adduction [facility]     Hip internal rotation [facility]    Hip external rotation [facility]    Knee flexion [facility]  Knee extension [facility]  Ankle dorsiflexion [facility]  Ankle plantarflexion [facility]  Ankle inversion [facility]  Ankle eversion WFL WFL   (Blank rows = not tested)   LOWER EXTREMITY MMT:   MMT Right eval Left eval Left 12/04/22 Left 12/18/22 Left 01/29/23 Left 03/15/23  Hip flexion WFL 2+ 4* 4- 4 4*  Hip extension WFL 2 3+  3+* 3+*  Hip abduction WFl 2 4-  3+* 4*  Hip adduction WFl        Hip internal rotation [facility]        Hip external rotation [facility]        Knee flexion WFL 3- 3+ 4- 4 4+*  Knee extension WFl 3 4+ 4* 4* 4-*  Ankle dorsiflexion WFL 3+      Ankle plantarflexion WFL 4      Ankle inversion WFL 4      Ankle eversion WFL 3-       (Blank rows = not tested) Pt's L ankle remains inverted 10 degrees since the Surgery.     FUNCTIONAL TESTS (INITIAL EVALUATION) 30 seconds chair stand test: 5 reps completed Timed up and go (TUG): 19secs 10 meter walk test: 12secs= 1,2 m/secs             Balance Test : SL standing unable, Modified tandem 10 secs, Tandem Unable, Rhomberg with EO 5 secs and unable with EC.  GAIT: Distance walked: 30 ft  Assistive device utilized: Single point cane Level of assistance: SBA Comments: Pt unsteady on feet with severe antalgic gait, decreased loading response, absent heel strike and absent toe off gait with uneven stride and decreased speed. Heavy reliance on the [facility]   -----  03/27/23  Posture: Lumbar lordosis: WNL Iliac crest height: Equal bilaterally Lumbar lateral shift: Mild shift to R to offload LLE  AROM AROM (Normal range in degrees) AROM   Lumbar   Flexion (65) 75% (stopped at 90 deg hip flexion)  Extension (30) 50%  Right lateral flexion (25) 100%  Left lateral flexion (25) 100%  Right rotation (30) 75%  Left rotation (30) 75%      (* = pain; Blank rows = not tested)   Sensation Grossly intact to light touch throughout bilateral LEs as  determined by testing dermatomes L2-S2. Proprioception, stereognosis, and hot/cold testing deferred on this date.   Special Tests Lumbar Radiculopathy and Discogenic: Centralization and Peripheralization (SN 92, -LR 0.12): Not examined Slump (SN 83, -LR 0.32): R: Negative L: Negative SLR (SN 92, -LR 0.29): R: Negative L:  Negative  Facet Joint: Extension-Rotation (SN 100, -LR 0.0): R: Negative L: Positive  Lumbar Foraminal Stenosis: Lumbar quadrant (SN 70): R: Negative L:  Positive     TODAY'S TREATMENT:                                                                                                                              DATE: 04/09/2023    L hip hemiarthroplasty following L femoral neck fracture (DOS: 10/07/22)   SUBJECTIVE STATEMENT:  Pt reports 3-3.5/10 pain today with no improvement following Medrol dosepack. Patient reports pain along medial groin region. Patient reports no numbness/tingling recently. Patient reports no night pain. Patient reports no changes in bathroom habits.    There were no vitals filed for this visit.    There is no height or weight on file to calculate BMI.     Therapeutic Exercise - for improved soft tissue flexibility and extensibility as needed for ROM, improved strength as needed to improve performance of CKC activities/functional movements  *GOAL UPDATE PERFORMED   Nu-Step L3. Seat at 12 to maintain posterior hip flexion precautions. 5 min, for warm up    Prone hip extension, alternating; 2x10 alt Prone knee bend, dynamic stretch; 2x10, 1 sec  Glute bridge with 10-lb ankle weight on pelvis; 2x8     Cold pack (unbilled) - for anti-inflammatory and analgesic effect as needed for reduced pain and improved ability to participate in active PT intervention, along L anterior in supine with bolster under knees for relaxation, x 5 minutes     PATIENT EDUCATION: We discussed limited pain provocation today with lower limb tension  testing/radiculopathy testing. We reviewed current impairments and reviewed updated home program. We discussed continued POC and prognosis.    *not today* Side steps in // bars: x3 D/B with Blue Tband Thomas hip flexor stretch; reviewed Butterfly stretch in supine; reviewed Manual hip flexor stretch in R sidelying, gentle (hip to neutral); 2 x 30 sec Toe tapping; 2x10 alternating R/L with 12-inch step, no UE support; stance on Airex pad Forward step up to Airex pad; 1x10 surgical LE leading  Standing march with 5-lb ankle weights; 2 x 10 alternating, holding treadmill armrest Sit to stand: 1x6 and 1x4; from chair with 8-lb Goblet hold  Unipedal stance; 2 x 15 sec Alternating hip flexion with Tband; 2x10 with Red Tband  -added to HEP Bridge 3x12:  Black Tband at distal femurs with good carryover in form/technique.  Butterfly stretch; 3 x 20 sec        PATIENT EDUCATION:  Education details: see above for patient education details Person educated: Patient Education method: Explanation and Demonstration Education comprehension: verbalized understanding   HOME EXERCISE PROGRAM: Access Code: [email or web address] URL: [email or web address] Date: 12/14/2022 Prepared by: Consuela Mimes  Exercises - Sit to Stand  - 1 x daily - 7 x weekly - 2 sets - 10 reps - Supine Bridge with Resistance Band  - 1 x daily - 7 x weekly - 2 sets - 10 reps - Sidelying Hip Abduction  - 1 x daily - 7 x weekly - 2 sets -  10 reps - Standing 3-Way Kick  - 1 x daily - 7 x weekly - 2 sets - 10 reps     ASSESSMENT:   CLINICAL IMPRESSION:  Patient has completed Medrol dosepack/steroid taper following Rx from surgeon. His new referral includes lumbar spine. We completed additional assessment today to check for deficits related to lumbar spine and provocation tests for L-spine. Testing may be limited by pt completing steroid taper; lower limb tension tests including SLR and SLUMP are negative today. Pt does  have anterior hip and thigh pain with prone knee bend; no back or iliolumbar pain with prone knee bend. No recent paresthesias to date and pt has no red flags at this time. Pt does have marked hip abductor and extensor weakness remaining. He has notable quad and hip flexor tightness. No notable low back/iliolumbar pain. Pt does report low-level pain following Medrol dosepack, but he is unsure of treatment effect of steroid taper. We will continue work on addressing the aforementioned deficits and progress with ambulatory/CKC drills as tolerated for pt to improve ability to complete community-level gait and negotiate work facility. Patient will benefit from continued skilled therapeutic intervention to address the remaining deficits in hip strength, mobility, and weightbearing tolerance as needed for improved function and ability to perform work duties.      OBJECTIVE IMPAIRMENTS: Abnormal gait, decreased activity tolerance, decreased balance, decreased endurance, decreased knowledge of condition, decreased knowledge of use of DME, decreased mobility, difficulty walking, decreased ROM, decreased strength, and dizziness.    ACTIVITY LIMITATIONS: carrying, lifting, bending, sitting, standing, squatting, sleeping, stairs, transfers, dressing, and locomotion level   PARTICIPATION LIMITATIONS: cleaning, shopping, community activity, occupation, and yard work   PERSONAL FACTORS:  Pt is impulsive and eager to get better so that he can return to work.   are also affecting patient's functional outcome.    REHAB POTENTIAL: Good   CLINICAL DECISION MAKING: Stable/uncomplicated   EVALUATION COMPLEXITY: Low     GOALS: Goals reviewed with patient? Yes   SHORT TERM GOALS: Target date: 12/22/2022 Pt will improve L hip ROM to 10 to 90 degrees without pain in order to promote normal gait and stability.  Baseline: 20 degree Hip felx and 0 deg Ext 12/04/22: 90 deg hip flex and 5 deg EXT 12/18/22: 90 deg hip flex  and 10 deg hip EXT Goal status: ACHIEVED    2.  Pt will be able to ambulate 566ft safely with Cane without fall and pain . Baseline: 30 ft with SBA and heavy reliance on [facility]   12/04/22: Pt uses SPC for primary means of mobility in home and community at this time.  Goal status: ACHIEVED       LONG TERM GOALS: Target date: 01/26/2023   Pt will Improve TUG time to <10secs without SPC to demonstrate decreased fall risk.  Baseline: 19 secs with SPC 12/04/22: 14.6 seconds with SPC 12/18/22: 9.5 sec Goal status: ACHIEVED   2.  Pt will Improve LEFS score to >65/80 to demonstrate improved QoL Baseline: 18/80 12/04/22: 20/80  12/18/22: 22/80 01/29/23: 25/80  03/15/23: 24/80 04/09/23:  Goal status: ON-GOING   3.  Pt will be able to ambulate Independently  with LRAD without LOB and pain in order to return to community level mobility safely  Baseline: With [facility] and [facility].   12/04/22: ModI ambulation with SPC/LRAD with mild pain at longer distances, no LOB 12/18/22: Amb IND with SPC with no LOB Goal status: ACHIEVED    4.  Pt will  score >50 on FOTO Baseline: 36/56 12/04/22: 35/56 12/18/22: 37/56 01/29/23: 43/56 03/15/23: 38/56 04/09/23: Goal status: IN PROGRESS   5.  Pt will demonstrate 4/5 L hip strength without pain to improve stability and safety with all functional mobility in order to return to work.  Baseline: 2/5 grossly    12/04/22: 4-/5 grossly 12/18/22: 4-/5 grossly 01/29/23: 4/5 for hip flexors/quads/HS, 3+/5 for hip ABD and EXT.  03/15/23: Remaining weakness in hip flexors, quads, abductors, extensors.  04/09/23:  Goal status: IN PROGRESS         PLAN:   PT FREQUENCY: 2x/week   PT DURATION: 4 weeks   PLANNED INTERVENTIONS: Therapeutic exercises, Therapeutic activity, Neuromuscular re-education, Balance training, Gait training, Patient/Family education, Self Care, and Joint mobilization   PLAN FOR NEXT SESSION: Hip extensor and abductor strengthening with integration of trunk stabilization  work, progressive weightbearing activity, graded introduction of closed-chain loading.      Consuela Mimes, PT, DPT [phone] Physical Therapist- [facility]  04/09/2023, 12:37 PM

## 2023-04-11 ENCOUNTER — Ambulatory Visit: Payer: Worker's Compensation | Admitting: Physical Therapy

## 2023-04-11 VITALS — BP 135/93

## 2023-04-11 DIAGNOSIS — R29898 Other symptoms and signs involving the musculoskeletal system: Secondary | ICD-10-CM | POA: Diagnosis present

## 2023-04-11 DIAGNOSIS — Z96642 Presence of left artificial hip joint: Secondary | ICD-10-CM | POA: Diagnosis present

## 2023-04-11 DIAGNOSIS — R2689 Other abnormalities of gait and mobility: Secondary | ICD-10-CM | POA: Diagnosis present

## 2023-04-11 DIAGNOSIS — R262 Difficulty in walking, not elsewhere classified: Secondary | ICD-10-CM | POA: Diagnosis present

## 2023-04-11 NOTE — Therapy (Unsigned)
OUTPATIENT PHYSICAL THERAPY TREATMENT AND PROGRESS NOTE   Dates of reporting period  03/15/23   to   04/09/23    Patient Name: Christian Hughes MRN: [id number] DOB:09-01-1960, 62 y.o., male Today's Date: 04/11/2023   END OF SESSION:   PT End of Session - 04/11/23 1453     Visit Number 30    Number of Visits 35    Authorization Type Workers Compensation    Progress Note Due on Visit 20    PT Start Time 1453    PT Stop Time 1536    PT Time Calculation (min) 43 min    Activity Tolerance Patient tolerated treatment well;Patient limited by pain    Behavior During Therapy WFL for tasks assessed/performed;Impulsive             Past Medical History:  Diagnosis Date   Engages in vaping    H/O ventral hernia    S/p of repair   Hypertension Early 2010's   Marijuana use    Orthostatic dizziness 10/08/2022   Past Surgical History:  Procedure Laterality Date   HIP ARTHROPLASTY Left 10/07/2022   Procedure: ARTHROPLASTY BIPOLAR HIP (HEMIARTHROPLASTY);  Surgeon: Christian Fairly, MD;  Location: [facility] ORS;  Service: Orthopedics;  Laterality: Left;   JOINT REPLACEMENT  10/06/2022   Left Hip Replacement due fall while at work   Ventral hernia repair     Patient Active Problem List   Diagnosis Date Noted   Asymptomatic hypertensive urgency 04/05/2023   Osteopenia determined by x-ray 02/12/2023   Hyperlipidemia 01/10/2023   Prediabetes 01/10/2023   Primary hypertension 01/10/2023   Left ventricular hypertrophy 01/10/2023   Abnormal EKG 10/06/2022   Bone lesion_sclerotic lesions in the right iliac bone 10/06/2022    PCP: Christian Oats MD  REFERRING PROVIDER: Juanell Fairly MD   REFERRING DIAG: [phone] (ICD-10-CM) - Presence of left artificial hip joint   THERAPY DIAG:  History of left hip hemiarthroplasty  Weakness of left leg  Balance problems  Difficulty in walking, not elsewhere classified  Rationale for Evaluation and Treatment Rehabilitation  PERTINENT HISTORY: From  initial eval: Christian Hughes is a 62 y.o. male without significant past medical history except for occasional vaping amd marijuana use, who presents with fall and left hip pain.  Pt states that he slipped and fell at work at about 11:00 AM.  Denies loss of consciousness.  He injured his left hip, causing left hip pain, which is constant, sharp, severe, nonradiating, aggravated by movement.  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    "My mom took care of me after my hip surgery. I had therapy at home. I returned to my home alone after my first follow up appointment. I have pain in my buttocks, groin and the corner of my buttocks of about 2 to 3/10. I have high pain tolerance. I am unable to walk normal and I feel weak. I began to use this cane on my own after my PT at home stopped.  I can't wait to go to work because, I love to work."   PAIN:  Are you having pain? Yes: Pain location: 2-3/10 Pain description: stabbing Aggravating factors: Moving, walking, WB   Relieving factors: rest, OTC meds, ice    WEIGHT BEARING RESTRICTIONS: Yes:  WBAT   FALLS:  Has patient fallen in last 6 months? Yes. 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 Pt negotiates some small staircases in town. Pt has historically taken 24-packs of water bottles to his work for customers.    PLOF: Independent   PATIENT GOALS: "I want to return to work without hurting and falling as soon as I can."   PRECAUTIONS: Posterior hip  precautions      OBJECTIVE: (objective measures completed at initial evaluation unless otherwise dated)   PATIENT SURVEYS:  LEFS 18/80 FOTO 36        PALPATION: Pain in L sacral border, Iliac crest, gluteus medius, TFL, and greater trochanter     LOWER EXTREMITY ROM:    Active ROM Right eval Left eval  Hip flexion WFL 20 deg with pain  Hip extension [facility]    Hip abduction WFL 20 with pain   Hip adduction [facility]    Hip internal rotation [facility]    Hip external rotation [facility]    Knee flexion [facility]  Knee extension [facility]  Ankle dorsiflexion [facility]  Ankle plantarflexion [facility]  Ankle inversion [facility]  Ankle eversion WFL WFL   (Blank rows = not tested)   LOWER EXTREMITY MMT:   MMT Right eval Left eval Left 12/04/22 Left 12/18/22 Left 01/29/23 Left 03/15/23 Left 04/11/23  Hip flexion WFL 2+ 4* 4- 4 4* 4*  Hip extension WFL 2 3+  3+* 3+* 4  Hip abduction WFl 2 4-  3+* 4* 4*  Hip adduction WFl         Hip internal rotation [facility]         Hip external rotation [facility]         Knee flexion WFL 3- 3+ 4- 4 4+* 5  Knee extension WFl 3 4+ 4* 4* 4-* 4+  Ankle dorsiflexion WFL 3+       Ankle plantarflexion WFL 4       Ankle inversion WFL 4       Ankle eversion WFL 3-        (Blank rows = not tested) Pt's L ankle remains inverted 10 degrees since the Surgery.     FUNCTIONAL TESTS (INITIAL EVALUATION) 30 seconds chair stand test: 5 reps completed Timed up and go (TUG): 19secs 10 meter walk test: 12secs= 1,2 m/secs             Balance Test : SL standing unable, Modified tandem 10 secs, Tandem Unable, Rhomberg with EO 5 secs and unable with EC.  GAIT: Distance walked: 30 ft  Assistive device utilized: Single point cane Level of assistance: SBA Comments: Pt unsteady on feet with severe antalgic gait, decreased loading response, absent heel strike and absent toe off gait with uneven stride and decreased speed. Heavy reliance on [facility]   -----  03/27/23  Posture: Lumbar lordosis:  WNL Iliac crest height: Equal bilaterally Lumbar lateral shift: Mild shift to R to offload LLE  AROM AROM (Normal range in degrees) AROM   Lumbar   Flexion (65) 75% (stopped at 90 deg hip flexion)  Extension (30) 50%  Right lateral flexion (25) 100%  Left lateral flexion (25) 100%  Right rotation (30)  75%  Left rotation (30) 75%      (* = pain; Blank rows = not tested)   Sensation Grossly intact to light touch throughout bilateral LEs as determined by testing dermatomes L2-S2. Proprioception, stereognosis, and hot/cold testing deferred on this date.   Special Tests Lumbar Radiculopathy and Discogenic: Centralization and Peripheralization (SN 92, -LR 0.12): Not examined Slump (SN 83, -LR 0.32): R: Negative L: Negative SLR (SN 92, -LR 0.29): R: Negative L:  Negative  Facet Joint: Extension-Rotation (SN 100, -LR 0.0): R: Negative L: Positive  Lumbar Foraminal Stenosis: Lumbar quadrant (SN 70): R: Negative L: Positive     TODAY'S TREATMENT:                                                                                                                              DATE: 04/11/2023    L hip hemiarthroplasty following L femoral neck fracture (DOS: 10/07/22)   SUBJECTIVE STATEMENT:  Pt reports no significant HA or visual symptoms at arrival. Patient reports feeling 2/10 discomfort along groin - pt states "it doesn't hurt at arrival." Patient reports his foot is sometimes internally rotated when stepping forward. Pt reports compliance with his HEP. Patient reports tolerating 9-1 shifts at work well with mostly sedentary office duties.     Today's Vitals   04/11/23 1458  BP: (!) 135/93      There is no height or weight on file to calculate BMI.     Therapeutic Exercise - for improved soft tissue flexibility and extensibility as needed for ROM, improved strength as needed to improve performance of CKC activities/functional movements  *GOAL UPDATE PERFORMED   Nu-Step  L3. Seat at 12 to maintain posterior hip flexion precautions. 5 min, for warm up    Prone hip extension, alternating; 2x8 alt; 1.5-lb ankle weights Prone knee bend, dynamic stretch; 1x10, 1 sec; for review  Glute bridge with Green physioball under calves; 2x8  Side steps along blue agility ladder: x3 D/B with Blue Tband looped at superior pole of patellae     PATIENT EDUCATION: We discussed current progress with PT and goals of PT moving forward, continued HEP, prognosis, POC.      *declined today* Cold pack (unbilled) - for anti-inflammatory and analgesic effect as needed for reduced pain and improved ability to participate in active PT intervention, along L anterior in supine with bolster under knees for relaxation, x 5 minutes    *not today* Toe tapping; 2x10 alternating R/L with 12-inch step, no UE support; stance on Airex padThomas hip flexor stretch; reviewed Butterfly stretch in supine; reviewed Manual hip flexor stretch in R sidelying, gentle (hip to neutral); 2 x 30 sec Forward step up to Airex pad; 1x10 surgical LE leading  Standing march with 5-lb ankle weights; 2 x 10 alternating, holding treadmill armrest Sit to stand: 1x6 and 1x4; from chair with 8-lb Goblet hold  Unipedal stance; 2 x 15 sec Alternating hip flexion with Tband;  2x10 with Red Tband  -added to HEP Bridge 3x12:  Black Tband at distal femurs with good carryover in form/technique.  Butterfly stretch; 3 x 20 sec        PATIENT EDUCATION:  Education details: see above for patient education details Person educated: Patient Education method: Explanation and Demonstration Education comprehension: verbalized understanding   HOME EXERCISE PROGRAM: Access Code: [email or web address] URL: [email or web address] Date: 12/14/2022 Prepared by: Consuela Mimes  Exercises - Sit to Stand  - 1 x daily - 7 x weekly - 2 sets - 10 reps - Supine Bridge with Resistance Band  - 1 x daily - 7 x weekly - 2 sets -  10 reps - Sidelying Hip Abduction  - 1 x daily - 7 x weekly - 2 sets - 10 reps - Standing 3-Way Kick  - 1 x daily - 7 x weekly - 2 sets - 10 reps     ASSESSMENT:   CLINICAL IMPRESSION:  Patient fortunately does not have blood pressure in hypertensive urgency range.  BP is better controlled with updated Rx including diuretic. Patient has improved hip abductor and extensor strength compared to last update. LEFS has improved to MCID, but pt has not yet met long-term goal for this. FOTO has not changed remarkably over last two surveys.  Patient demonstrates improved gait quality with mild compensated Trendelenburg and decreased stance time on L lower extremity. Pt reports not having substantial pain, but feeling sensitivity and discomfort along L groin that is continuous and can come more bothersome with higher volume of activity. Pt has remaining deficits in hip flexor/ABD/EXT strength, weightbearing tolerance, gait deviations, and limitations with negotiating steps/curbs/uneven terrain. Patient will benefit from continued skilled therapeutic intervention to address the remaining deficits in hip strength, mobility, and weightbearing tolerance as needed for improved function and ability to perform work duties.      OBJECTIVE IMPAIRMENTS: Abnormal gait, decreased activity tolerance, decreased balance, decreased endurance, decreased knowledge of condition, decreased knowledge of use of DME, decreased mobility, difficulty walking, decreased ROM, decreased strength, and dizziness.    ACTIVITY LIMITATIONS: carrying, lifting, bending, sitting, standing, squatting, sleeping, stairs, transfers, dressing, and locomotion level   PARTICIPATION LIMITATIONS: cleaning, shopping, community activity, occupation, and yard work   PERSONAL FACTORS:  Pt is impulsive and eager to get better so that he can return to work.   are also affecting patient's functional outcome.    REHAB POTENTIAL: Good   CLINICAL DECISION  MAKING: Stable/uncomplicated   EVALUATION COMPLEXITY: Low     GOALS: Goals reviewed with patient? Yes   SHORT TERM GOALS: Target date: 12/22/2022 Pt will improve L hip ROM to 10 to 90 degrees without pain in order to promote normal gait and stability.  Baseline: 20 degree Hip felx and 0 deg Ext 12/04/22: 90 deg hip flex and 5 deg EXT 12/18/22: 90 deg hip flex and 10 deg hip EXT Goal status: ACHIEVED    2.  Pt will be able to ambulate 532ft safely with Cane without fall and pain . Baseline: 30 ft with SBA and heavy reliance on [facility].   12/04/22: Pt uses SPC for primary means of mobility in home and community at this time.  Goal status: ACHIEVED       LONG TERM GOALS: Target date: 01/26/2023   Pt will Improve TUG time to <10secs without SPC to demonstrate decreased fall risk.  Baseline: 19 secs with SPC 12/04/22: 14.6 seconds with SPC 12/18/22: 9.5 sec Goal status: ACHIEVED   2.  Pt will Improve LEFS score to >65/80 to demonstrate improved QoL Baseline: 18/80 12/04/22: 20/80  12/18/22: 22/80 01/29/23: 25/80  03/15/23: 24/80 04/09/23: 27/80  Goal status: ON-GOING   3.  Pt will be able to ambulate Independently  with LRAD without LOB and pain in order to return to community level mobility safely  Baseline: With [facility] and FWW.   12/04/22: ModI ambulation with SPC/LRAD with mild pain at longer distances, no LOB 12/18/22: Amb IND with SPC with no LOB Goal status: ACHIEVED    4.  Pt will score >50 on FOTO Baseline: 36/56 12/04/22: 35/56 12/18/22: 37/56 01/29/23: 43/56 03/15/23: 38/56 04/09/23: 43/56 Goal status: IN PROGRESS   5.  Pt will demonstrate 4/5 L hip strength without pain to improve stability and safety with all functional mobility in order to return to work.  Baseline: 2/5 grossly    12/04/22: 4-/5 grossly 12/18/22: 4-/5 grossly 01/29/23: 4/5 for hip flexors/quads/HS, 3+/5 for hip ABD and EXT.  03/15/23: Remaining weakness in hip flexors, quads, abductors, extensors.  04/09/23: Improved strength  with pt up to at least 4/5, pain with loading hip flexors and abductors  Goal status: IN PROGRESS         PLAN:   PT FREQUENCY: 2x/week   PT DURATION: 4 weeks   PLANNED INTERVENTIONS: Therapeutic exercises, Therapeutic activity, Neuromuscular re-education, Balance training, Gait training, Patient/Family education, Self Care, and Joint mobilization   PLAN FOR NEXT SESSION: Hip extensor and abductor strengthening with integration of trunk stabilization work, progressive weightbearing activity, graded introduction of closed-chain loading.      Consuela Mimes, PT, DPT [phone] Physical Therapist- [facility]  04/11/2023, 2:59 PM

## 2023-04-12 ENCOUNTER — Encounter: Payer: Self-pay | Admitting: Physical Therapy

## 2023-04-16 ENCOUNTER — Ambulatory Visit: Payer: Worker's Compensation | Attending: Orthopedic Surgery | Admitting: Physical Therapy

## 2023-04-16 VITALS — BP 142/85 | HR 61

## 2023-04-16 DIAGNOSIS — R29898 Other symptoms and signs involving the musculoskeletal system: Secondary | ICD-10-CM | POA: Insufficient documentation

## 2023-04-16 DIAGNOSIS — Z96642 Presence of left artificial hip joint: Secondary | ICD-10-CM | POA: Diagnosis present

## 2023-04-16 DIAGNOSIS — R2689 Other abnormalities of gait and mobility: Secondary | ICD-10-CM | POA: Insufficient documentation

## 2023-04-16 DIAGNOSIS — R262 Difficulty in walking, not elsewhere classified: Secondary | ICD-10-CM | POA: Insufficient documentation

## 2023-04-16 NOTE — Therapy (Signed)
OUTPATIENT PHYSICAL THERAPY TREATMENT   Patient Name: Christian Hughes MRN: [id number] DOB:06/18/1961, 62 y.o., male Today's Date: 04/16/2023   END OF SESSION:   PT End of Session - 04/16/23 1502     Visit Number 31    Number of Visits 35    Authorization Type Workers Compensation    Progress Note Due on Visit 20    PT Start Time 1500    PT Stop Time 1543    PT Time Calculation (min) 43 min    Activity Tolerance Patient tolerated treatment well;Patient limited by pain    Behavior During Therapy WFL for tasks assessed/performed;Impulsive             Past Medical History:  Diagnosis Date   Engages in vaping    H/O ventral hernia    S/p of repair   Hypertension Early 2010's   Marijuana use    Orthostatic dizziness 10/08/2022   Past Surgical History:  Procedure Laterality Date   HIP ARTHROPLASTY Left 10/07/2022   Procedure: ARTHROPLASTY BIPOLAR HIP (HEMIARTHROPLASTY);  Surgeon: Juanell Fairly, MD;  Location: [facility] ORS;  Service: Orthopedics;  Laterality: Left;   JOINT REPLACEMENT  10/06/2022   Left Hip Replacement due fall while at work   Ventral hernia repair     Patient Active Problem List   Diagnosis Date Noted   Asymptomatic hypertensive urgency 04/05/2023   Osteopenia determined by x-ray 02/12/2023   Hyperlipidemia 01/10/2023   Prediabetes 01/10/2023   Primary hypertension 01/10/2023   Left ventricular hypertrophy 01/10/2023   Abnormal EKG 10/06/2022   Bone lesion_sclerotic lesions in the right iliac bone 10/06/2022    PCP: Dewaine Oats MD  REFERRING PROVIDER: Juanell Fairly MD   REFERRING DIAG: [phone] (ICD-10-CM) - Presence of left artificial hip joint   THERAPY DIAG:  History of left hip hemiarthroplasty  Weakness of left leg  Balance problems  Difficulty in walking, not elsewhere classified  Rationale for Evaluation and Treatment Rehabilitation  PERTINENT HISTORY: From initial eval: DOUGLAS RACZ is a 62 y.o. male without significant past  medical history except for occasional vaping amd marijuana use, who presents with fall and left hip pain.  Pt states that he slipped and fell at work at about 11:00 AM.  Denies loss of consciousness.  He injured his left hip, causing left hip pain, which is constant, sharp, severe, nonradiating, aggravated by movement.  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    "My mom took care of me after my hip surgery. I had therapy at home. I returned to my home alone after my first follow up appointment. I have pain in my buttocks, groin and the corner of my buttocks of about 2 to 3/10. I have high pain tolerance. I am unable to walk normal and I feel weak. I began to use this cane on my own after my PT at home stopped.  I can't wait to go to work because, I love to work."   PAIN:  Are you having pain? Yes: Pain location: 2-3/10 Pain description: stabbing Aggravating factors: Moving, walking, WB   Relieving factors: rest, OTC meds, ice    WEIGHT BEARING RESTRICTIONS: Yes:  WBAT   FALLS:  Has patient fallen in last 6 months? Yes. 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 Pt negotiates some small staircases in town. Pt has historically taken 24-packs of water bottles to his work for customers.    PLOF: Independent   PATIENT GOALS: "I want to return to work without hurting and falling as soon as I can."   PRECAUTIONS: Posterior hip precautions      OBJECTIVE: (objective measures completed at initial evaluation unless otherwise  dated)   PATIENT SURVEYS:  LEFS 18/80 FOTO 36        PALPATION: Pain in L sacral border, Iliac crest, gluteus medius, TFL, and greater trochanter     LOWER EXTREMITY ROM:    Active ROM Right eval Left eval  Hip flexion WFL 20 deg with pain  Hip extension [facility]    Hip abduction WFL 20 with pain   Hip adduction [facility]    Hip internal rotation [facility]    Hip external rotation [facility]    Knee flexion [facility]  Knee extension [facility]  Ankle dorsiflexion [facility]  Ankle plantarflexion [facility]  Ankle inversion [facility]  Ankle eversion WFL WFL   (Blank rows = not tested)   LOWER EXTREMITY MMT:   MMT Right eval Left eval Left 12/04/22 Left 12/18/22 Left 01/29/23 Left 03/15/23 Left 04/11/23  Hip flexion WFL 2+ 4* 4- 4 4* 4*  Hip extension WFL 2 3+  3+* 3+* 4  Hip abduction WFl 2 4-  3+* 4* 4*  Hip adduction WFl         Hip internal rotation [facility]         Hip external rotation [facility]         Knee flexion WFL 3- 3+ 4- 4 4+* 5  Knee extension WFl 3 4+ 4* 4* 4-* 4+  Ankle dorsiflexion WFL 3+       Ankle plantarflexion WFL 4       Ankle inversion WFL 4       Ankle eversion WFL 3-        (Blank rows = not tested) Pt's L ankle remains inverted 10 degrees since the Surgery.     FUNCTIONAL TESTS (INITIAL EVALUATION) 30 seconds chair stand test: 5 reps completed Timed up and go (TUG): 19secs 10 meter walk test: 12secs= 1,2 m/secs             Balance Test : SL standing unable, Modified tandem 10 secs, Tandem Unable, Rhomberg with EO 5 secs and unable with EC.  GAIT: Distance walked: 30 ft  Assistive device utilized: Single point cane Level of assistance: SBA Comments: Pt unsteady on feet with severe antalgic gait, decreased loading response, absent heel strike and absent toe off gait with uneven stride and decreased speed. Heavy reliance on the [facility]   -----  03/27/23  Posture: Lumbar lordosis: WNL Iliac crest height: Equal bilaterally Lumbar lateral shift: Mild shift to R to offload  LLE  AROM AROM (Normal range in degrees) AROM   Lumbar   Flexion (65) 75% (stopped at 90 deg hip flexion)  Extension (30) 50%  Right lateral flexion (25) 100%  Left lateral flexion (25) 100%  Right rotation (30) 75%  Left rotation (30) 75%      (* = pain; Blank rows = not  tested)   Sensation Grossly intact to light touch throughout bilateral LEs as determined by testing dermatomes L2-S2. Proprioception, stereognosis, and hot/cold testing deferred on this date.   Special Tests Lumbar Radiculopathy and Discogenic: Centralization and Peripheralization (SN 92, -LR 0.12): Not examined Slump (SN 83, -LR 0.32): R: Negative L: Negative SLR (SN 92, -LR 0.29): R: Negative L:  Negative  Facet Joint: Extension-Rotation (SN 100, -LR 0.0): R: Negative L: Positive  Lumbar Foraminal Stenosis: Lumbar quadrant (SN 70): R: Negative L: Positive     TODAY'S TREATMENT:                                                                                                                              DATE: 04/16/2023    L hip hemiarthroplasty following L femoral neck fracture (DOS: 10/07/22)   SUBJECTIVE STATEMENT:  Pt reports going to beach this past weekend and being able to walk on sand without cane or AD. Patient reports mild unsteadiness; his significant other was walking beside him and guarding him. Patient reports 2/10 pain oftentimes throughout this week with inclement/cloudy/rainy weather.     Today's Vitals   04/16/23 1505 04/16/23 1506  BP: (!) 183/91 (!) 142/85  Pulse: 61       There is no height or weight on file to calculate BMI.     Therapeutic Exercise - for improved soft tissue flexibility and extensibility as needed for ROM, improved strength as needed to improve performance of CKC activities/functional movements  Nu-Step L3. Seat at 12 to maintain posterior hip flexion precautions. 5 min, for warm up   Prone hip extension, alternating; 2x10 alt; 2-lb ankle  weights  Glute bridge with Green physioball under calves; 2x10  Sidelying hip abduction; 1x5 2-lb;  1x8 no weight  Side steps along blue agility ladder: x4 D/B with Blue Tband looped at superior pole of patellae   Forward step up to Airex pad; 1x15 surgical LE leading   PATIENT EDUCATION: We discussed current progress with PT and goals of PT moving forward, continued HEP, prognosis, POC.      *declined today* Cold pack (unbilled) - for anti-inflammatory and analgesic effect as needed for reduced pain and improved ability to participate in active PT intervention, along L anterior in supine with bolster under knees for relaxation, x 5 minutes    *not today* Prone knee bend, dynamic stretch; 1x10, 1 sec; for review Toe tapping; 2x10 alternating R/L with 12-inch step, no UE support; stance on Airex padThomas hip flexor stretch; reviewed Butterfly stretch in supine; reviewed Manual hip flexor stretch in R sidelying, gentle (hip to neutral); 2 x 30 sec Standing march with 5-lb ankle weights; 2 x 10 alternating, holding treadmill armrest Sit to stand: 1x6 and 1x4; from chair with 8-lb Goblet hold  Unipedal stance; 2 x 15 sec Alternating hip flexion with Tband; 2x10 with Red Tband  -added to HEP Bridge 3x12:  Black Tband at distal femurs with good  carryover in form/technique.  Butterfly stretch; 3 x 20 sec        PATIENT EDUCATION:  Education details: see above for patient education details Person educated: Patient Education method: Explanation and Demonstration Education comprehension: verbalized understanding   HOME EXERCISE PROGRAM: Access Code: [email or web address] URL: [email or web address] Date: 12/14/2022 Prepared by: Consuela Mimes  Exercises - Sit to Stand  - 1 x daily - 7 x weekly - 2 sets - 10 reps - Supine Bridge with Resistance Band  - 1 x daily - 7 x weekly - 2 sets - 10 reps - Sidelying Hip Abduction  - 1 x daily - 7 x weekly - 2 sets - 10 reps -  Standing 3-Way Kick  - 1 x daily - 7 x weekly - 2 sets - 10 reps     ASSESSMENT:   CLINICAL IMPRESSION:  Patient does have improved gait pattern with improved stance time on operative lower limb and lesser severity of compensated Trendelenburg with subtle lateral flexion noted today. Patient is able to modestly progress intensity and volume of low-impact hip strengthening, and he tolerates stepping up onto Airex pad well - no LOB with compliant surface. Pt has remaining deficits in hip flexor/ABD/EXT strength, weightbearing tolerance, gait deviations, and limitations with negotiating steps/curbs/uneven terrain. Patient will benefit from continued skilled therapeutic intervention to address the remaining deficits in hip strength, mobility, and weightbearing tolerance as needed for improved function and ability to perform work duties.      OBJECTIVE IMPAIRMENTS: Abnormal gait, decreased activity tolerance, decreased balance, decreased endurance, decreased knowledge of condition, decreased knowledge of use of DME, decreased mobility, difficulty walking, decreased ROM, decreased strength, and dizziness.    ACTIVITY LIMITATIONS: carrying, lifting, bending, sitting, standing, squatting, sleeping, stairs, transfers, dressing, and locomotion level   PARTICIPATION LIMITATIONS: cleaning, shopping, community activity, occupation, and yard work   PERSONAL FACTORS:  Pt is impulsive and eager to get better so that he can return to work.   are also affecting patient's functional outcome.    REHAB POTENTIAL: Good   CLINICAL DECISION MAKING: Stable/uncomplicated   EVALUATION COMPLEXITY: Low     GOALS: Goals reviewed with patient? Yes   SHORT TERM GOALS: Target date: 12/22/2022 Pt will improve L hip ROM to 10 to 90 degrees without pain in order to promote normal gait and stability.  Baseline: 20 degree Hip felx and 0 deg Ext 12/04/22: 90 deg hip flex and 5 deg EXT 12/18/22: 90 deg hip flex and 10 deg hip  EXT Goal status: ACHIEVED    2.  Pt will be able to ambulate 559ft safely with Cane without fall and pain . Baseline: 30 ft with SBA and heavy reliance on [facility].   12/04/22: Pt uses SPC for primary means of mobility in home and community at this time.  Goal status: ACHIEVED       LONG TERM GOALS: Target date: 01/26/2023   Pt will Improve TUG time to <10secs without SPC to demonstrate decreased fall risk.  Baseline: 19 secs with SPC 12/04/22: 14.6 seconds with SPC 12/18/22: 9.5 sec Goal status: ACHIEVED   2.  Pt will Improve LEFS score to >65/80 to demonstrate improved QoL Baseline: 18/80 12/04/22: 20/80  12/18/22: 22/80 01/29/23: 25/80  03/15/23: 24/80 04/09/23: 27/80  Goal status: ON-GOING   3.  Pt will be able to ambulate Independently  with LRAD without LOB and pain in order to return to community level mobility safely  Baseline: With [facility] and [facility].   12/04/22: ModI  ambulation with SPC/LRAD with mild pain at longer distances, no LOB 12/18/22: Amb IND with SPC with no LOB Goal status: ACHIEVED    4.  Pt will score >50 on FOTO Baseline: 36/56 12/04/22: 35/56 12/18/22: 37/56 01/29/23: 43/56 03/15/23: 38/56 04/09/23: 43/56 Goal status: IN PROGRESS   5.  Pt will demonstrate 4/5 L hip strength without pain to improve stability and safety with all functional mobility in order to return to work.  Baseline: 2/5 grossly    12/04/22: 4-/5 grossly 12/18/22: 4-/5 grossly 01/29/23: 4/5 for hip flexors/quads/HS, 3+/5 for hip ABD and EXT.  03/15/23: Remaining weakness in hip flexors, quads, abductors, extensors.  04/09/23: Improved strength with pt up to at least 4/5, pain with loading hip flexors and abductors  Goal status: IN PROGRESS         PLAN:   PT FREQUENCY: 2x/week   PT DURATION: 4 weeks   PLANNED INTERVENTIONS: Therapeutic exercises, Therapeutic activity, Neuromuscular re-education, Balance training, Gait training, Patient/Family education, Self Care, and Joint mobilization   PLAN FOR NEXT  SESSION: Hip extensor and abductor strengthening with integration of trunk stabilization work, progressive weightbearing activity, graded introduction of closed-chain loading.     Consuela Mimes, PT, DPT [phone] Physical Therapist- [facility]  04/16/2023, 3:48 PM

## 2023-04-18 ENCOUNTER — Ambulatory Visit: Payer: Worker's Compensation | Admitting: Physical Therapy

## 2023-04-18 ENCOUNTER — Encounter: Payer: Self-pay | Admitting: Physical Therapy

## 2023-04-18 VITALS — BP 131/80 | HR 84

## 2023-04-18 DIAGNOSIS — Z96642 Presence of left artificial hip joint: Secondary | ICD-10-CM | POA: Diagnosis not present

## 2023-04-18 DIAGNOSIS — R2689 Other abnormalities of gait and mobility: Secondary | ICD-10-CM

## 2023-04-18 DIAGNOSIS — R262 Difficulty in walking, not elsewhere classified: Secondary | ICD-10-CM

## 2023-04-18 DIAGNOSIS — R29898 Other symptoms and signs involving the musculoskeletal system: Secondary | ICD-10-CM

## 2023-04-18 NOTE — Therapy (Signed)
OUTPATIENT PHYSICAL THERAPY TREATMENT   Patient Name: Christian Hughes MRN: [id number] DOB:1961/06/30, 62 y.o., male Today's Date: 04/18/2023   END OF SESSION:   PT End of Session - 04/18/23 1450     Visit Number 32    Number of Visits 35    Authorization Type Workers Compensation    PT Start Time 1456    PT Stop Time 1538    PT Time Calculation (min) 42 min    Activity Tolerance Patient tolerated treatment well;Patient limited by pain    Behavior During Therapy WFL for tasks assessed/performed;Impulsive              Past Medical History:  Diagnosis Date   Engages in vaping    H/O ventral hernia    S/p of repair   Hypertension Early 2010's   Marijuana use    Orthostatic dizziness 10/08/2022   Past Surgical History:  Procedure Laterality Date   HIP ARTHROPLASTY Left 10/07/2022   Procedure: ARTHROPLASTY BIPOLAR HIP (HEMIARTHROPLASTY);  Surgeon: Juanell Fairly, MD;  Location: [facility] ORS;  Service: Orthopedics;  Laterality: Left;   JOINT REPLACEMENT  10/06/2022   Left Hip Replacement due fall while at work   Ventral hernia repair     Patient Active Problem List   Diagnosis Date Noted   Asymptomatic hypertensive urgency 04/05/2023   Osteopenia determined by x-ray 02/12/2023   Hyperlipidemia 01/10/2023   Prediabetes 01/10/2023   Primary hypertension 01/10/2023   Left ventricular hypertrophy 01/10/2023   Abnormal EKG 10/06/2022   Bone lesion_sclerotic lesions in the right iliac bone 10/06/2022    PCP: Dewaine Oats MD  REFERRING PROVIDER: Juanell Fairly MD   REFERRING DIAG: [phone] (ICD-10-CM) - Presence of left artificial hip joint   THERAPY DIAG:  History of left hip hemiarthroplasty  Weakness of left leg  Balance problems  Difficulty in walking, not elsewhere classified  Rationale for Evaluation and Treatment Rehabilitation  PERTINENT HISTORY: From initial eval: Christian Hughes is a 62 y.o. male without significant past medical history except for  occasional vaping amd marijuana use, who presents with fall and left hip pain.  Pt states that he slipped and fell at work at about 11:00 AM.  Denies loss of consciousness.  He injured his left hip, causing left hip pain, which is constant, sharp, severe, nonradiating, aggravated by movement.  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    "My mom took care of me after my hip surgery. I had therapy at home. I returned to my home alone after my first follow up appointment. I have pain in my buttocks, groin and the corner of my buttocks of about 2 to 3/10. I have high pain tolerance. I am unable to walk normal and I feel weak. I began to use this cane on my own after my PT at home stopped.  I can't wait to go to work because, I love to work."   PAIN:  Are you having pain? Yes: Pain location: 2-3/10 Pain description: stabbing Aggravating factors: Moving, walking, WB   Relieving factors: rest, OTC meds, ice    WEIGHT BEARING RESTRICTIONS: Yes:  WBAT   FALLS:  Has patient fallen in last 6 months? Yes. 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 Pt negotiates some small staircases in town. Pt has historically taken 24-packs of water bottles to his work for customers.    PLOF: Independent   PATIENT GOALS: "I want to return to work without hurting and falling as soon as I can."   PRECAUTIONS: Posterior hip precautions      OBJECTIVE: (objective measures completed at initial evaluation unless otherwise dated)   PATIENT  SURVEYS:  LEFS 18/80 FOTO 36        PALPATION: Pain in L sacral border, Iliac crest, gluteus medius, TFL, and greater trochanter     LOWER EXTREMITY ROM:    Active ROM Right eval Left eval  Hip flexion WFL 20 deg with pain  Hip extension [facility]    Hip abduction WFL 20 with pain   Hip adduction [facility]    Hip internal rotation [facility]    Hip external rotation [facility]    Knee flexion [facility]  Knee extension [facility]  Ankle dorsiflexion [facility]  Ankle plantarflexion [facility]  Ankle inversion [facility]  Ankle eversion WFL WFL   (Blank rows = not tested)   LOWER EXTREMITY MMT:   MMT Right eval Left eval Left 12/04/22 Left 12/18/22 Left 01/29/23 Left 03/15/23 Left 04/11/23  Hip flexion WFL 2+ 4* 4- 4 4* 4*  Hip extension WFL 2 3+  3+* 3+* 4  Hip abduction WFl 2 4-  3+* 4* 4*  Hip adduction WFl         Hip internal rotation [facility]         Hip external rotation [facility]         Knee flexion WFL 3- 3+ 4- 4 4+* 5  Knee extension WFl 3 4+ 4* 4* 4-* 4+  Ankle dorsiflexion WFL 3+       Ankle plantarflexion WFL 4       Ankle inversion WFL 4       Ankle eversion WFL 3-        (Blank rows = not tested) Pt's L ankle remains inverted 10 degrees since the Surgery.     FUNCTIONAL TESTS (INITIAL EVALUATION) 30 seconds chair stand test: 5 reps completed Timed up and go (TUG): 19secs 10 meter walk test: 12secs= 1,2 m/secs             Balance Test : SL standing unable, Modified tandem 10 secs, Tandem Unable, Rhomberg with EO 5 secs and unable with EC.  GAIT: Distance walked: 30 ft  Assistive device utilized: Single point cane Level of assistance: SBA Comments: Pt unsteady on feet with severe antalgic gait, decreased loading response, absent heel strike and absent toe off gait with uneven stride and decreased speed. Heavy reliance on the [facility]   -----  03/27/23  Posture: Lumbar lordosis: WNL Iliac crest height: Equal bilaterally Lumbar lateral shift: Mild shift to R to offload LLE  AROM AROM (Normal range  in degrees) AROM   Lumbar   Flexion (65) 75% (stopped at 90 deg hip flexion)  Extension (30) 50%  Right lateral flexion (25) 100%  Left lateral flexion (25) 100%  Right rotation (30) 75%  Left rotation (30) 75%      (* = pain; Blank rows = not tested)   Sensation Grossly intact to light  touch throughout bilateral LEs as determined by testing dermatomes L2-S2. Proprioception, stereognosis, and hot/cold testing deferred on this date.   Special Tests Lumbar Radiculopathy and Discogenic: Centralization and Peripheralization (SN 92, -LR 0.12): Not examined Slump (SN 83, -LR 0.32): R: Negative L: Negative SLR (SN 92, -LR 0.29): R: Negative L:  Negative  Facet Joint: Extension-Rotation (SN 100, -LR 0.0): R: Negative L: Positive  Lumbar Foraminal Stenosis: Lumbar quadrant (SN 70): R: Negative L: Positive     TODAY'S TREATMENT:                                                                                                                              DATE: 04/18/2023    L hip hemiarthroplasty following L femoral neck fracture (DOS: 10/07/22)   SUBJECTIVE STATEMENT:  Pt reports similar "tweak" along L medial groin with NPRS around 2/10 typically. He reports tolerating sedentary work duties well recently.    Today's Vitals   04/18/23 1458  BP: 131/80  Pulse: 84       There is no height or weight on file to calculate BMI.     Therapeutic Exercise - for improved soft tissue flexibility and extensibility as needed for ROM, improved strength as needed to improve performance of CKC activities/functional movements  Nu-Step L3. Seat at 12 to maintain posterior hip flexion precautions. 5 min, for warm up   Alternating hip flexion with Tband; 2x10 with Red Tband  Alternating arms and legs with abdominal brace with Green physioball; x15 alternating R/L  Prone hip extension, alternating; 2x10 alt; 2-lb ankle weights  Glute bridge with Green physioball under calves;  2x10  Side steps along blue agility ladder: x4 D/B with Blue Tband looped at superior pole of patellae    Lateral step up to 6-inch step; staircase in center of gym; 1 x 8  PATIENT EDUCATION: We discussed current progress with PT and goals of PT moving forward, continued HEP, prognosis, POC.      *declined today* Cold pack (unbilled) - for anti-inflammatory and analgesic effect as needed for reduced pain and improved ability to participate in active PT intervention, along L anterior in supine with bolster under knees for relaxation, x 5 minutes    *not today* Forward step up to Airex pad; 1x15 surgical LE leading  Sidelying hip abduction, no weight; 2x8 Prone knee bend, dynamic stretch; 1x10, 1 sec; for review Toe tapping; 2x10 alternating R/L with 12-inch step, no UE support; stance on Airex padThomas hip flexor stretch; reviewed Butterfly stretch in supine; reviewed Manual hip flexor stretch in R sidelying, gentle (hip to neutral); 2 x 30 sec Standing march with 5-lb ankle weights; 2 x 10 alternating, holding treadmill armrest Sit to stand: 1x6 and 1x4; from chair with 8-lb Goblet hold  Unipedal stance; 2 x 15 sec Bridge 3x12:  Black Tband at distal femurs with good carryover in form/technique.  Butterfly stretch; 3 x 20 sec  PATIENT EDUCATION:  Education details: see above for patient education details Person educated: Patient Education method: Explanation and Demonstration Education comprehension: verbalized understanding   HOME EXERCISE PROGRAM: Access Code: [email or web address] URL: [email or web address] Date: 04/18/2023 Prepared by: Consuela Mimes  Exercises - Sit to Stand  - 1 x daily - 7 x weekly - 2 sets - 10 reps - Supine Bridge with Resistance Band  - 1 x daily - 7 x weekly - 2 sets - 10 reps - Sidelying Hip Abduction  - 1 x daily - 7 x weekly - 2 sets - 10 reps - Prone Hip Extension  - 1 x daily - 7 x weekly - 2 sets - 10 reps - Supine Hip  Flexion with Resistance Loop  - 1 x daily - 7 x weekly - 2 sets - 10 reps - Prone Femoral Nerve Mobilization  - 2 x daily - 7 x weekly - 2 sets - 10 reps - 1sec hold     ASSESSMENT:   CLINICAL IMPRESSION:  Patient has ongoing groin pain that is relatively low-level per NPRS, but he is limited in tolerance of loading hip flexors and with weightbearing exercise. Patient demonstrates no antalgic pattern or compensatory pattern with lateral step-up. Pt needs further work on strengthening and graded exposure to increased volume of weightbearing activity. Pt has remaining deficits in hip flexor/ABD/EXT strength, weightbearing tolerance, gait deviations, and limitations with negotiating steps/curbs/uneven terrain. Patient will benefit from continued skilled therapeutic intervention to address the remaining deficits in hip strength, mobility, and weightbearing tolerance as needed for improved function and ability to perform work duties.      OBJECTIVE IMPAIRMENTS: Abnormal gait, decreased activity tolerance, decreased balance, decreased endurance, decreased knowledge of condition, decreased knowledge of use of DME, decreased mobility, difficulty walking, decreased ROM, decreased strength, and dizziness.    ACTIVITY LIMITATIONS: carrying, lifting, bending, sitting, standing, squatting, sleeping, stairs, transfers, dressing, and locomotion level   PARTICIPATION LIMITATIONS: cleaning, shopping, community activity, occupation, and yard work   PERSONAL FACTORS:  Pt is impulsive and eager to get better so that he can return to work.   are also affecting patient's functional outcome.    REHAB POTENTIAL: Good   CLINICAL DECISION MAKING: Stable/uncomplicated   EVALUATION COMPLEXITY: Low     GOALS: Goals reviewed with patient? Yes   SHORT TERM GOALS: Target date: 12/22/2022 Pt will improve L hip ROM to 10 to 90 degrees without pain in order to promote normal gait and stability.  Baseline: 20 degree Hip  felx and 0 deg Ext 12/04/22: 90 deg hip flex and 5 deg EXT 12/18/22: 90 deg hip flex and 10 deg hip EXT Goal status: ACHIEVED    2.  Pt will be able to ambulate 517ft safely with Cane without fall and pain . Baseline: 30 ft with SBA and heavy reliance on [facility].   12/04/22: Pt uses SPC for primary means of mobility in home and community at this time.  Goal status: ACHIEVED       LONG TERM GOALS: Target date: 01/26/2023   Pt will Improve TUG time to <10secs without SPC to demonstrate decreased fall risk.  Baseline: 19 secs with SPC 12/04/22: 14.6 seconds with SPC 12/18/22: 9.5 sec Goal status: ACHIEVED   2.  Pt will Improve LEFS score to >65/80 to demonstrate improved QoL Baseline: 18/80 12/04/22: 20/80  12/18/22: 22/80 01/29/23: 25/80  03/15/23: 24/80 04/09/23: 27/80  Goal status: ON-GOING   3.  Pt will be able to ambulate Independently  with LRAD without LOB and pain in order to return to community level mobility safely  Baseline: With [facility] and [facility].   12/04/22: ModI ambulation with SPC/LRAD with mild pain at longer distances, no LOB 12/18/22: Amb IND with SPC with no LOB Goal status: ACHIEVED    4.  Pt will score >50 on FOTO Baseline: 36/56 12/04/22: 35/56 12/18/22: 37/56 01/29/23: 43/56 03/15/23: 38/56 04/09/23: 43/56 Goal status: IN PROGRESS   5.  Pt will demonstrate 4/5 L hip strength without pain to improve stability and safety with all functional mobility in order to return to work.  Baseline: 2/5 grossly    12/04/22: 4-/5 grossly 12/18/22: 4-/5 grossly 01/29/23: 4/5 for hip flexors/quads/HS, 3+/5 for hip ABD and EXT.  03/15/23: Remaining weakness in hip flexors, quads, abductors, extensors.  04/09/23: Improved strength with pt up to at least 4/5, pain with loading hip flexors and abductors  Goal status: IN PROGRESS         PLAN:   PT FREQUENCY: 2x/week   PT DURATION: 4 weeks   PLANNED INTERVENTIONS: Therapeutic exercises, Therapeutic activity, Neuromuscular re-education, Balance training,  Gait training, Patient/Family education, Self Care, and Joint mobilization   PLAN FOR NEXT SESSION: Hip extensor and abductor strengthening with integration of trunk stabilization work, progressive weightbearing activity, graded introduction of closed-chain loading.     Consuela Mimes, PT, DPT [phone] Physical Therapist- [facility]  04/18/2023, 3:02 PM

## 2023-04-23 ENCOUNTER — Ambulatory Visit: Payer: Worker's Compensation | Attending: Orthopedic Surgery | Admitting: Physical Therapy

## 2023-04-23 VITALS — BP 152/85 | HR 63

## 2023-04-23 DIAGNOSIS — R262 Difficulty in walking, not elsewhere classified: Secondary | ICD-10-CM

## 2023-04-23 DIAGNOSIS — R2689 Other abnormalities of gait and mobility: Secondary | ICD-10-CM | POA: Diagnosis present

## 2023-04-23 DIAGNOSIS — R29898 Other symptoms and signs involving the musculoskeletal system: Secondary | ICD-10-CM | POA: Diagnosis present

## 2023-04-23 DIAGNOSIS — Z96642 Presence of left artificial hip joint: Secondary | ICD-10-CM | POA: Diagnosis present

## 2023-04-23 NOTE — Therapy (Signed)
OUTPATIENT PHYSICAL THERAPY TREATMENT   Patient Name: Christian Hughes MRN: [id number] DOB:Feb 05, 1961, 62 y.o., male Today's Date: 04/23/2023   END OF SESSION:   PT End of Session - 04/23/23 1503     Visit Number 33    Number of Visits 35    Authorization Type Workers Compensation    PT Start Time 1502    PT Stop Time 1543    PT Time Calculation (min) 41 min    Activity Tolerance Patient tolerated treatment well;Patient limited by pain    Behavior During Therapy WFL for tasks assessed/performed;Impulsive              Past Medical History:  Diagnosis Date   Engages in vaping    H/O ventral hernia    S/p of repair   Hypertension Early 2010's   Marijuana use    Orthostatic dizziness 10/08/2022   Past Surgical History:  Procedure Laterality Date   HIP ARTHROPLASTY Left 10/07/2022   Procedure: ARTHROPLASTY BIPOLAR HIP (HEMIARTHROPLASTY);  Surgeon: Christian Fairly, MD;  Location: [facility] ORS;  Service: Orthopedics;  Laterality: Left;   JOINT REPLACEMENT  10/06/2022   Left Hip Replacement due fall while at work   Ventral hernia repair     Patient Active Problem List   Diagnosis Date Noted   Asymptomatic hypertensive urgency 04/05/2023   Osteopenia determined by x-ray 02/12/2023   Hyperlipidemia 01/10/2023   Prediabetes 01/10/2023   Primary hypertension 01/10/2023   Left ventricular hypertrophy 01/10/2023   Abnormal EKG 10/06/2022   Bone lesion_sclerotic lesions in the right iliac bone 10/06/2022    PCP: Christian Oats MD  REFERRING PROVIDER: Juanell Fairly MD   REFERRING DIAG: [phone] (ICD-10-CM) - Presence of left artificial hip joint   THERAPY DIAG:  History of left hip hemiarthroplasty  Weakness of left leg  Balance problems  Difficulty in walking, not elsewhere classified  Rationale for Evaluation and Treatment Rehabilitation  PERTINENT HISTORY: From initial eval: Christian Hughes is a 62 y.o. male without significant past medical history except for  occasional vaping amd marijuana use, who presents with fall and left hip pain.  Pt states that he slipped and fell at work at about 11:00 AM.  Denies loss of consciousness.  He injured his left hip, causing left hip pain, which is constant, sharp, severe, nonradiating, aggravated by movement.  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    "My mom took care of me after my hip surgery. I had therapy at home. I returned to my home alone after my first follow up appointment. I have pain in my buttocks, groin and the corner of my buttocks of about 2 to 3/10. I have high pain tolerance. I am unable to walk normal and I feel weak. I began to use this cane on my own after my PT at home stopped.  I can't wait to go to work because, I love to work."   PAIN:  Are you having pain? Yes: Pain location: 2-3/10 Pain description: stabbing Aggravating factors: Moving, walking, WB   Relieving factors: rest, OTC meds, ice    WEIGHT BEARING RESTRICTIONS: Yes:  WBAT   FALLS:  Has patient fallen in last 6 months? Yes. 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 Pt negotiates some small staircases in town. Pt has historically taken 24-packs of water bottles to his work for customers.    PLOF: Independent   PATIENT GOALS: "I want to return to work without hurting and falling as soon as I can."   PRECAUTIONS: Posterior hip precautions      OBJECTIVE: (objective measures completed at initial evaluation unless otherwise dated)   PATIENT  SURVEYS:  LEFS 18/80 FOTO 36        PALPATION: Pain in L sacral border, Iliac crest, gluteus medius, TFL, and greater trochanter     LOWER EXTREMITY ROM:    Active ROM Right eval Left eval  Hip flexion WFL 20 deg with pain  Hip extension [facility]    Hip abduction WFL 20 with pain   Hip adduction [facility]    Hip internal rotation [facility]    Hip external rotation [facility]    Knee flexion [facility]  Knee extension [facility]  Ankle dorsiflexion [facility]  Ankle plantarflexion [facility]  Ankle inversion [facility]  Ankle eversion WFL WFL   (Blank rows = not tested)   LOWER EXTREMITY MMT:   MMT Right eval Left eval Left 12/04/22 Left 12/18/22 Left 01/29/23 Left 03/15/23 Left 04/11/23  Hip flexion WFL 2+ 4* 4- 4 4* 4*  Hip extension WFL 2 3+  3+* 3+* 4  Hip abduction WFl 2 4-  3+* 4* 4*  Hip adduction WFl         Hip internal rotation [facility]         Hip external rotation [facility]         Knee flexion WFL 3- 3+ 4- 4 4+* 5  Knee extension WFl 3 4+ 4* 4* 4-* 4+  Ankle dorsiflexion WFL 3+       Ankle plantarflexion WFL 4       Ankle inversion WFL 4       Ankle eversion WFL 3-        (Blank rows = not tested) Pt's L ankle remains inverted 10 degrees since the Surgery.     FUNCTIONAL TESTS (INITIAL EVALUATION) 30 seconds chair stand test: 5 reps completed Timed up and go (TUG): 19secs 10 meter walk test: 12secs= 1,2 m/secs             Balance Test : SL standing unable, Modified tandem 10 secs, Tandem Unable, Rhomberg with EO 5 secs and unable with EC.  GAIT: Distance walked: 30 ft  Assistive device utilized: Single point cane Level of assistance: SBA Comments: Pt unsteady on feet with severe antalgic gait, decreased loading response, absent heel strike and absent toe off gait with uneven stride and decreased speed. Heavy reliance on [facility]   -----  03/27/23  Posture: Lumbar lordosis: WNL Iliac crest height: Equal bilaterally Lumbar lateral shift: Mild shift to R to offload LLE  AROM AROM (Normal range  in degrees) AROM   Lumbar   Flexion (65) 75% (stopped at 90 deg hip flexion)  Extension (30) 50%  Right lateral flexion (25) 100%  Left lateral flexion (25) 100%  Right rotation (30) 75%  Left rotation (30) 75%      (* = pain; Blank rows = not tested)   Sensation Grossly intact to light  touch throughout bilateral LEs as determined by testing dermatomes L2-S2. Proprioception, stereognosis, and hot/cold testing deferred on this date.   Special Tests Lumbar Radiculopathy and Discogenic: Centralization and Peripheralization (SN 92, -LR 0.12): Not examined Slump (SN 83, -LR 0.32): R: Negative L: Negative SLR (SN 92, -LR 0.29): R: Negative L:  Negative  Facet Joint: Extension-Rotation (SN 100, -LR 0.0): R: Negative L: Positive  Lumbar Foraminal Stenosis: Lumbar quadrant (SN 70): R: Negative L: Positive     TODAY'S TREATMENT:                                                                                                                              DATE: 04/23/2023    L hip hemiarthroplasty following L femoral neck fracture (DOS: 10/07/22)   SUBJECTIVE STATEMENT:  Pt reports some soreness after last visit that he felt until the following morning. Patient reports continuous 2/10 pain in groin region that has been relatively unchanged.    Today's Vitals   04/23/23 1504  BP: (!) 152/85  Pulse: 63    There is no height or weight on file to calculate BMI.     Therapeutic Exercise - for improved soft tissue flexibility and extensibility as needed for ROM, improved strength as needed to improve performance of CKC activities/functional movements   Nu-Step L4. Seat at 12 to maintain posterior hip flexion precautions. 5 min, for warm up  Alternating arms and legs with abdominal brace with Green physioball; x15 alternating R/L  Prone hip extension, alternating; 2x10 alt; 2-lb ankle weights  Glute bridge with Green physioball under calves; 2x10  Side steps along blue  agility ladder: x4 D/B with Blue Tband looped at superior pole of patellae   Lateral step up to 6-inch step; staircase in center of gym; 1x15  Forward step over (3) 6-inch hurdles, unsupported in open gym environment; 5x D/B with reciprocal pattern   PATIENT EDUCATION: We discussed current progress with PT and goals of PT. We discussed expectations for healing in first year after major orthopedic surgery.    *next visit* Alternating hip flexion with Tband, in standing today; 2x10 with Red Tband   *declined today* Cold pack (unbilled) - for anti-inflammatory and analgesic effect as needed for reduced pain and improved ability to participate in active PT intervention, along L anterior in supine with bolster under knees for relaxation, x 5 minutes    *not today* Forward step up to Airex pad; 1x15 surgical LE leading  Sidelying hip abduction, no weight; 2x8 Prone knee bend, dynamic stretch; 1x10, 1 sec; for review Toe tapping; 2x10 alternating R/L with 12-inch step, no UE support; stance on Airex padThomas hip flexor stretch; reviewed Butterfly stretch in supine; reviewed Manual hip flexor stretch in R sidelying, gentle (hip to neutral); 2 x 30 sec Standing march with 5-lb ankle weights; 2 x 10 alternating, holding treadmill armrest Sit to stand: 1x6 and 1x4; from chair with 8-lb Goblet hold  Unipedal stance;  2 x 15 sec Bridge 3x12:  Black Tband at distal femurs with good carryover in form/technique.  Butterfly stretch; 3 x 20 sec        PATIENT EDUCATION:  Education details: see above for patient education details Person educated: Patient Education method: Explanation and Demonstration Education comprehension: verbalized understanding   HOME EXERCISE PROGRAM: Access Code: [email or web address] URL: [email or web address] Date: 04/18/2023 Prepared by: Consuela Mimes  Exercises - Sit to Stand  - 1 x daily - 7 x weekly - 2 sets - 10 reps - Supine Bridge with Resistance  Band  - 1 x daily - 7 x weekly - 2 sets - 10 reps - Sidelying Hip Abduction  - 1 x daily - 7 x weekly - 2 sets - 10 reps - Prone Hip Extension  - 1 x daily - 7 x weekly - 2 sets - 10 reps - Supine Hip Flexion with Resistance Loop  - 1 x daily - 7 x weekly - 2 sets - 10 reps - Prone Femoral Nerve Mobilization  - 2 x daily - 7 x weekly - 2 sets - 10 reps - 1sec hold     ASSESSMENT:   CLINICAL IMPRESSION:  Patient is able to tolerate more standing activity, and he is able to continue gluteal, hip flexor, and trunk strengthening with intermittent discomfort affecting L groin. Pt has continuous pressure and discomfort along medial groin region that has persisted throughout this episode of care without notable response to manual therapy techniques or STM/TPR. We are focusing primarily on strengthening and recovery of function at this time with use of modalities for pain prn. Gait pattern is continuing to improve with increased stance time on surgical limb and decreased compensated Trendelenburg. Pt has remaining deficits in hip flexor/ABD/EXT strength, weightbearing tolerance, gait deviations, and limitations with negotiating steps/curbs/uneven terrain. Patient will benefit from continued skilled therapeutic intervention to address the remaining deficits in hip strength, mobility, and weightbearing tolerance as needed for improved function and ability to perform work duties.      OBJECTIVE IMPAIRMENTS: Abnormal gait, decreased activity tolerance, decreased balance, decreased endurance, decreased knowledge of condition, decreased knowledge of use of DME, decreased mobility, difficulty walking, decreased ROM, decreased strength, and dizziness.    ACTIVITY LIMITATIONS: carrying, lifting, bending, sitting, standing, squatting, sleeping, stairs, transfers, dressing, and locomotion level   PARTICIPATION LIMITATIONS: cleaning, shopping, community activity, occupation, and yard work   PERSONAL FACTORS:  Pt is  impulsive and eager to get better so that he can return to work.   are also affecting patient's functional outcome.    REHAB POTENTIAL: Good   CLINICAL DECISION MAKING: Stable/uncomplicated   EVALUATION COMPLEXITY: Low     GOALS: Goals reviewed with patient? Yes   SHORT TERM GOALS: Target date: 12/22/2022 Pt will improve L hip ROM to 10 to 90 degrees without pain in order to promote normal gait and stability.  Baseline: 20 degree Hip felx and 0 deg Ext 12/04/22: 90 deg hip flex and 5 deg EXT 12/18/22: 90 deg hip flex and 10 deg hip EXT Goal status: ACHIEVED    2.  Pt will be able to ambulate 51ft safely with Cane without fall and pain . Baseline: 30 ft with SBA and heavy reliance on [facility].   12/04/22: Pt uses SPC for primary means of mobility in home and community at this time.  Goal status: ACHIEVED       LONG TERM GOALS: Target date: 01/26/2023   Pt will Improve TUG time to <  10secs without SPC to demonstrate decreased fall risk.  Baseline: 19 secs with SPC 12/04/22: 14.6 seconds with SPC 12/18/22: 9.5 sec Goal status: ACHIEVED   2.  Pt will Improve LEFS score to >65/80 to demonstrate improved QoL Baseline: 18/80 12/04/22: 20/80  12/18/22: 22/80 01/29/23: 25/80  03/15/23: 24/80 04/09/23: 27/80  Goal status: ON-GOING   3.  Pt will be able to ambulate Independently  with LRAD without LOB and pain in order to return to community level mobility safely  Baseline: With [facility] and FWW.   12/04/22: ModI ambulation with SPC/LRAD with mild pain at longer distances, no LOB 12/18/22: Amb IND with SPC with no LOB Goal status: ACHIEVED    4.  Pt will score >50 on FOTO Baseline: 36/56 12/04/22: 35/56 12/18/22: 37/56 01/29/23: 43/56 03/15/23: 38/56 04/09/23: 43/56 Goal status: IN PROGRESS   5.  Pt will demonstrate 4/5 L hip strength without pain to improve stability and safety with all functional mobility in order to return to work.  Baseline: 2/5 grossly    12/04/22: 4-/5 grossly 12/18/22: 4-/5  grossly 01/29/23: 4/5 for hip flexors/quads/HS, 3+/5 for hip ABD and EXT.  03/15/23: Remaining weakness in hip flexors, quads, abductors, extensors.  04/09/23: Improved strength with pt up to at least 4/5, pain with loading hip flexors and abductors  Goal status: IN PROGRESS         PLAN:   PT FREQUENCY: 2x/week   PT DURATION: 4 weeks   PLANNED INTERVENTIONS: Therapeutic exercises, Therapeutic activity, Neuromuscular re-education, Balance training, Gait training, Patient/Family education, Self Care, and Joint mobilization   PLAN FOR NEXT SESSION: Hip extensor and abductor strengthening with integration of trunk stabilization work, progressive weightbearing activity, graded introduction of closed-chain loading.     Consuela Mimes, PT, DPT [phone] Physical Therapist- [facility]  04/23/2023, 3:05 PM

## 2023-04-25 ENCOUNTER — Ambulatory Visit: Payer: Worker's Compensation | Attending: Orthopedic Surgery | Admitting: Physical Therapy

## 2023-04-25 ENCOUNTER — Encounter: Payer: Self-pay | Admitting: Physical Therapy

## 2023-04-25 VITALS — BP 157/91 | HR 70

## 2023-04-25 DIAGNOSIS — R29898 Other symptoms and signs involving the musculoskeletal system: Secondary | ICD-10-CM

## 2023-04-25 DIAGNOSIS — Z96642 Presence of left artificial hip joint: Secondary | ICD-10-CM | POA: Diagnosis present

## 2023-04-25 DIAGNOSIS — R262 Difficulty in walking, not elsewhere classified: Secondary | ICD-10-CM | POA: Diagnosis present

## 2023-04-25 DIAGNOSIS — R2689 Other abnormalities of gait and mobility: Secondary | ICD-10-CM | POA: Diagnosis present
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
## 2023-04-30 ENCOUNTER — Ambulatory Visit: Payer: Worker's Compensation | Admitting: Physical Therapy

## 2023-04-30 ENCOUNTER — Encounter: Payer: Self-pay | Admitting: Physical Therapy

## 2023-04-30 VITALS — BP 130/84

## 2023-04-30 DIAGNOSIS — Z96642 Presence of left artificial hip joint: Secondary | ICD-10-CM | POA: Diagnosis not present

## 2023-04-30 DIAGNOSIS — R29898 Other symptoms and signs involving the musculoskeletal system: Secondary | ICD-10-CM

## 2023-04-30 DIAGNOSIS — R2689 Other abnormalities of gait and mobility: Secondary | ICD-10-CM

## 2023-04-30 DIAGNOSIS — R262 Difficulty in walking, not elsewhere classified: Secondary | ICD-10-CM

## 2023-04-30 NOTE — Therapy (Unsigned)
OUTPATIENT PHYSICAL THERAPY TREATMENT/GOAL UPDATE   Patient Name: Christian Hughes MRN: [id number] DOB:09-28-1960, 62 y.o., male Today's Date: 04/30/2023   END OF SESSION:   PT End of Session - 04/30/23 1446     Visit Number 35    Number of Visits 43    Authorization Type Workers Compensation    PT Start Time 1501    PT Stop Time 1542    PT Time Calculation (min) 41 min    Activity Tolerance Patient tolerated treatment well;Patient limited by pain    Behavior During Therapy WFL for tasks assessed/performed;Impulsive              Past Medical History:  Diagnosis Date   Engages in vaping    H/O ventral hernia    S/p of repair   Hypertension Early 2010's   Marijuana use    Orthostatic dizziness 10/08/2022   Past Surgical History:  Procedure Laterality Date   HIP ARTHROPLASTY Left 10/07/2022   Procedure: ARTHROPLASTY BIPOLAR HIP (HEMIARTHROPLASTY);  Surgeon: Juanell Fairly, MD;  Location: [facility] ORS;  Service: Orthopedics;  Laterality: Left;   JOINT REPLACEMENT  10/06/2022   Left Hip Replacement due fall while at work   Ventral hernia repair     Patient Active Problem List   Diagnosis Date Noted   Asymptomatic hypertensive urgency 04/05/2023   Osteopenia determined by x-ray 02/12/2023   Hyperlipidemia 01/10/2023   Prediabetes 01/10/2023   Primary hypertension 01/10/2023   Left ventricular hypertrophy 01/10/2023   Abnormal EKG 10/06/2022   Bone lesion_sclerotic lesions in the right iliac bone 10/06/2022    PCP: Dewaine Oats MD  REFERRING PROVIDER: Juanell Fairly MD   REFERRING DIAG: [phone] (ICD-10-CM) - Presence of left artificial hip joint   THERAPY DIAG:  History of left hip hemiarthroplasty  Weakness of left leg  Balance problems  Difficulty in walking, not elsewhere classified  Rationale for Evaluation and Treatment Rehabilitation  PERTINENT HISTORY: From initial eval: Christian Hughes is a 62 y.o. male without significant past medical history except  for occasional vaping amd marijuana use, who presents with fall and left hip pain.  Pt states that he slipped and fell at work at about 11:00 AM.  Denies loss of consciousness.  He injured his left hip, causing left hip pain, which is constant, sharp, severe, nonradiating, aggravated by movement.  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    "My mom took care of me after my hip surgery. I had therapy at home. I returned to my home alone after my first follow up appointment. I have pain in my buttocks, groin and the corner of my buttocks of about 2 to 3/10. I have high pain tolerance. I am unable to walk normal and I feel weak. I began to use this cane on my own after my PT at home stopped.  I can't wait to go to work because, I love to work."   PAIN:  Are you having pain? Yes: Pain location: 2-3/10 Pain description: stabbing Aggravating factors: Moving, walking, WB   Relieving factors: rest, OTC meds, ice    WEIGHT BEARING RESTRICTIONS: Yes:  WBAT   FALLS:  Has patient fallen in last 6 months? Yes. 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 Pt negotiates some small staircases in town. Pt has historically taken 24-packs of water bottles to his work for customers.    PLOF: Independent   PATIENT GOALS: "I want to return to work without hurting and falling as soon as I can."   PRECAUTIONS: Posterior hip precautions      OBJECTIVE: (objective measures completed at initial evaluation unless otherwise dated)   PATIENT  SURVEYS:  LEFS 18/80 FOTO 36        PALPATION: Pain in L sacral border, Iliac crest, gluteus medius, TFL, and greater trochanter     LOWER EXTREMITY ROM:    Active ROM Right eval Left eval  Hip flexion WFL 20 deg with pain  Hip extension [facility]    Hip abduction WFL 20 with pain   Hip adduction [facility]    Hip internal rotation [facility]    Hip external rotation [facility]    Knee flexion [facility]  Knee extension [facility]  Ankle dorsiflexion [facility]  Ankle plantarflexion [facility]  Ankle inversion [facility]  Ankle eversion WFL WFL   (Blank rows = not tested)   LOWER EXTREMITY MMT:   MMT Right eval Left eval Left 12/04/22 Left 12/18/22 Left 01/29/23 Left 03/15/23 Left 04/11/23 Left 04/30/23  Hip flexion WFL 2+ 4* 4- 4 4* 4* 4+  Hip extension WFL 2 3+  3+* 3+* 4 3+*  Hip abduction WFl 2 4-  3+* 4* 4* 3+*  Hip adduction WFl          Hip internal rotation [facility]          Hip external rotation [facility]          Knee flexion WFL 3- 3+ 4- 4 4+* 5 5  Knee extension WFl 3 4+ 4* 4* 4-* 4+ 5  Ankle dorsiflexion WFL 3+        Ankle plantarflexion WFL 4        Ankle inversion WFL 4        Ankle eversion WFL 3-         (Blank rows = not tested) Pt's L ankle remains inverted 10 degrees since the Surgery.     FUNCTIONAL TESTS (INITIAL EVALUATION) 30 seconds chair stand test: 5 reps completed Timed up and go (TUG): 19secs 10 meter walk test: 12secs= 1,2 m/secs             Balance Test : SL standing unable, Modified tandem 10 secs, Tandem Unable, Rhomberg with EO 5 secs and unable with EC.  GAIT: Distance walked: 30 ft  Assistive device utilized: Single point cane Level of assistance: SBA Comments: Pt unsteady on feet with severe antalgic gait, decreased loading response, absent heel strike and absent toe off gait with uneven stride and decreased speed. Heavy reliance on the [facility]   -----  03/27/23  Posture: Lumbar lordosis: WNL Iliac crest height: Equal bilaterally Lumbar lateral shift: Mild shift to R to  offload LLE  AROM AROM (Normal range in degrees) AROM   Lumbar   Flexion (65) 75% (stopped at 90 deg hip flexion)  Extension (30) 50%  Right lateral flexion (25) 100%  Left lateral flexion (25) 100%  Right rotation (30) 75%  Left rotation (30) 75%      (* =  pain; Blank rows = not tested)   Sensation Grossly intact to light touch throughout bilateral LEs as determined by testing dermatomes L2-S2. Proprioception, stereognosis, and hot/cold testing deferred on this date.   Special Tests Lumbar Radiculopathy and Discogenic: Centralization and Peripheralization (SN 92, -LR 0.12): Not examined Slump (SN 83, -LR 0.32): R: Negative L: Negative SLR (SN 92, -LR 0.29): R: Negative L:  Negative  Facet Joint: Extension-Rotation (SN 100, -LR 0.0): R: Negative L: Positive  Lumbar Foraminal Stenosis: Lumbar quadrant (SN 70): R: Negative L: Positive      TODAY'S TREATMENT:                                                                                                                              DATE: 04/30/2023    L hip hemiarthroplasty following L femoral neck fracture (DOS: 10/07/22)   SUBJECTIVE STATEMENT:  Pt reports having more pain in L groin since last Saturday. Patient reports no significant change in activity Friday to Saturday. Patient reports pain 5-6/10 with walking at this time. Pt reports he is unsure of his progress due to certain days being better than others. Patient is continuing with light duty at work at this time. 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 Patient reports no other physical demands for work which he is worried about.     Today's Vitals   04/30/23 1502 04/30/23 1506  BP: (!) 144/98 130/84      There is no height or weight on file to calculate BMI.   Manual Therapy - for  symptom modulation, soft tissue sensitivity and mobility, joint mobility, ROM   Gentle passive stretching for hip adductors in supine; 2x30 sec Gentle passive stretching for hip flexors in R sidelying; 2x30sec  Gentle LLE distraction; Kaltenborn grade gr II; x 5 second intervals  -for 2 min   Therapeutic Exercise - for improved soft tissue flexibility and extensibility as needed for ROM, improved strength as needed to improve performance of CKC activities/functional movements   *GOAL UPDATE PERFORMED   Nu-Step L3. Seat at 12 to maintain posterior hip flexion precautions. 3 min, for warm up  -limited time due to hip pain   Cold pack (unbilled) - for anti-inflammatory and analgesic effect as needed for reduced pain and improved ability to participate in active PT intervention, along L anterior in supine with bolster under knees for relaxation, x 5 minutes    Glute bridge with Green physioball under calves; 3x10  Alternating arms and legs with abdominal brace with Green physioball; x15 alternating R/L  Prone hip extension, alternating; 2x10 alt; 2-lb ankle weights  Alternating hip flexion with Tband, in standing today; 1x10 alternating R/L with Red Tband  Side steps along blue agility ladder: x4 D/B with Blue Tband looped at superior pole of patellae  Lateral step up to 6-inch step; staircase in center of gym; 1x15  Forward step over (3) 6-inch hurdles and (2) 12-inch hurdles, unsupported in open gym environment; 3x D/B with reciprocal pattern  -pain toward end of last set, pt feels he needs to stop  PATIENT EDUCATION: We discussed current progress with PT and goals of PT. We discussed expectations for healing in first year after major orthopedic surgery.     *not today* Forward step up to Airex pad; 1x15 surgical LE leading  Sidelying hip abduction, no weight; 2x8 Prone knee bend, dynamic stretch; 1x10, 1 sec; for review Toe tapping; 2x10 alternating R/L with 12-inch  step, no UE support; stance on Airex padThomas hip flexor stretch; reviewed Butterfly stretch in supine; reviewed Manual hip flexor stretch in R sidelying, gentle (hip to neutral); 2 x 30 sec Standing march with 5-lb ankle weights; 2 x 10 alternating, holding treadmill armrest Sit to stand: 1x6 and 1x4; from chair with 8-lb Goblet hold  Unipedal stance; 2 x 15 sec Bridge 3x12:  Black Tband at distal femurs with good carryover in form/technique.  Butterfly stretch; 3 x 20 sec        PATIENT EDUCATION:  Education details: see above for patient education details Person educated: Patient Education method: Explanation and Demonstration Education comprehension: verbalized understanding   HOME EXERCISE PROGRAM: Access Code: [email or web address] URL: [email or web address] Date: 04/18/2023 Prepared by: Consuela Mimes  Exercises - Sit to Stand  - 1 x daily - 7 x weekly - 2 sets - 10 reps - Supine Bridge with Resistance Band  - 1 x daily - 7 x weekly - 2 sets - 10 reps - Sidelying Hip Abduction  - 1 x daily - 7 x weekly - 2 sets - 10 reps - Prone Hip Extension  - 1 x daily - 7 x weekly - 2 sets - 10 reps - Supine Hip Flexion with Resistance Loop  - 1 x daily - 7 x weekly - 2 sets - 10 reps - Prone Femoral Nerve Mobilization  - 2 x daily - 7 x weekly - 2 sets - 10 reps - 1sec hold     ASSESSMENT:   CLINICAL IMPRESSION:  In spite of persistent pain longer than expected post-operative healing timeline, patient has made notable functional progress with pt minimally using AD at this time - he travels with [facility] to use on as-needed basis. Pt is able to modestly progress volume of weightbearing activity today, though he does have increasing groin pain early in session requiring cryotherapy. We have refrained from MT due to intolerance of direct pressure onto hip flexor/groin region and no benefit. Pt needs continued work on strengthening and weightbearing tolerance as needed for completion of  his work duties. Pt has remaining deficits in hip flexor/ABD/EXT strength, weightbearing tolerance, gait deviations, and limitations with negotiating steps/curbs/uneven terrain. Patient will benefit from continued skilled therapeutic intervention to address the remaining deficits in hip strength, mobility, and weightbearing tolerance as needed for improved function and ability to perform work duties.      OBJECTIVE IMPAIRMENTS: Abnormal gait, decreased activity tolerance, decreased balance, decreased endurance, decreased knowledge of condition, decreased knowledge of use of DME, decreased mobility, difficulty walking, decreased ROM, decreased strength, and dizziness.    ACTIVITY LIMITATIONS: carrying, lifting, bending, sitting, standing, squatting, sleeping, stairs, transfers, dressing, and locomotion level   PARTICIPATION LIMITATIONS: cleaning, shopping, community activity, occupation, and yard work   PERSONAL FACTORS:  Pt is impulsive and eager to get better so  that he can return to work.   are also affecting patient's functional outcome.    REHAB POTENTIAL: Good   CLINICAL DECISION MAKING: Stable/uncomplicated   EVALUATION COMPLEXITY: Low     GOALS: Goals reviewed with patient? Yes   SHORT TERM GOALS: Target date: 12/22/2022 Pt will improve L hip ROM to 10 to 90 degrees without pain in order to promote normal gait and stability.  Baseline: 20 degree Hip felx and 0 deg Ext 12/04/22: 90 deg hip flex and 5 deg EXT 12/18/22: 90 deg hip flex and 10 deg hip EXT Goal status: ACHIEVED    2.  Pt will be able to ambulate 562ft safely with Cane without fall and pain . Baseline: 30 ft with SBA and heavy reliance on [facility].   12/04/22: Pt uses SPC for primary means of mobility in home and community at this time.  Goal status: ACHIEVED       LONG TERM GOALS: Target date: 01/26/2023   Pt will Improve TUG time to <10secs without SPC to demonstrate decreased fall risk.  Baseline: 19 secs with  SPC 12/04/22: 14.6 seconds with SPC 12/18/22: 9.5 sec Goal status: ACHIEVED   2.  Pt will Improve LEFS score to >65/80 to demonstrate improved QoL Baseline: 18/80 12/04/22: 20/80  12/18/22: 22/80 01/29/23: 25/80  03/15/23: 24/80 04/09/23: 27/80  04/30/23: 26/80 Goal status: ON-GOING   3.  Pt will be able to ambulate Independently  with LRAD without LOB and pain in order to return to community level mobility safely  Baseline: With [facility] and [facility].   12/04/22: ModI ambulation with SPC/LRAD with mild pain at longer distances, no LOB 12/18/22: Amb IND with SPC with no LOB Goal status: ACHIEVED    4.  Pt will score >50 on FOTO Baseline: 36/56 12/04/22: 35/56 12/18/22: 37/56 01/29/23: 43/56 03/15/23: 38/56 04/09/23: 43/56 04/30/23: 41/56 Goal status: IN PROGRESS   5.  Pt will demonstrate 4/5 L hip strength without pain to improve stability and safety with all functional mobility in order to return to work.  Baseline: 2/5 grossly    12/04/22: 4-/5 grossly 12/18/22: 4-/5 grossly 01/29/23: 4/5 for hip flexors/quads/HS, 3+/5 for hip ABD and EXT.  03/15/23: Remaining weakness in hip flexors, quads, abductors, extensors.  04/09/23: Improved strength with pt up to at least 4/5, pain with loading hip flexors and abductors  04/30/23:  Goal status: IN PROGRESS         PLAN:   PT FREQUENCY: 2x/week   PT DURATION: 4 weeks   PLANNED INTERVENTIONS: Therapeutic exercises, Therapeutic activity, Neuromuscular re-education, Balance training, Gait training, Patient/Family education, Self Care, and Joint mobilization   PLAN FOR NEXT SESSION: Hip extensor and abductor strengthening with integration of trunk stabilization work, progressive weightbearing activity, graded introduction of closed-chain loading.     Consuela Mimes, PT, DPT [phone] Physical Therapist- [facility]  04/30/2023, 3:08 PM

## 2023-05-02 ENCOUNTER — Encounter: Payer: Self-pay | Admitting: Physical Therapy

## 2023-05-02 ENCOUNTER — Ambulatory Visit: Payer: Worker's Compensation | Attending: Orthopedic Surgery | Admitting: Physical Therapy

## 2023-05-02 VITALS — BP 141/80 | HR 72

## 2023-05-02 DIAGNOSIS — Z96642 Presence of left artificial hip joint: Secondary | ICD-10-CM | POA: Diagnosis present

## 2023-05-02 DIAGNOSIS — M25552 Pain in left hip: Secondary | ICD-10-CM | POA: Diagnosis present

## 2023-05-02 DIAGNOSIS — R262 Difficulty in walking, not elsewhere classified: Secondary | ICD-10-CM | POA: Insufficient documentation

## 2023-05-02 DIAGNOSIS — R29898 Other symptoms and signs involving the musculoskeletal system: Secondary | ICD-10-CM | POA: Diagnosis present

## 2023-05-02 DIAGNOSIS — R2689 Other abnormalities of gait and mobility: Secondary | ICD-10-CM | POA: Diagnosis present

## 2023-05-02 NOTE — Therapy (Signed)
OUTPATIENT PHYSICAL THERAPY TREATMENT  Patient Name: Christian Hughes MRN: [id number] DOB:05/16/1961, 62 y.o., male Today's Date: 05/02/2023   END OF SESSION:   PT End of Session - 05/02/23 1458     Visit Number 36    Number of Visits 43    Authorization Type Workers Compensation    PT Start Time 1457    PT Stop Time 1542    PT Time Calculation (min) 45 min    Activity Tolerance Patient tolerated treatment well;Patient limited by pain    Behavior During Therapy WFL for tasks assessed/performed;Impulsive               Past Medical History:  Diagnosis Date   Engages in vaping    H/O ventral hernia    S/p of repair   Hypertension Early 2010's   Marijuana use    Orthostatic dizziness 10/08/2022   Past Surgical History:  Procedure Laterality Date   HIP ARTHROPLASTY Left 10/07/2022   Procedure: ARTHROPLASTY BIPOLAR HIP (HEMIARTHROPLASTY);  Surgeon: Juanell Fairly, MD;  Location: [facility] ORS;  Service: Orthopedics;  Laterality: Left;   JOINT REPLACEMENT  10/06/2022   Left Hip Replacement due fall while at work   Ventral hernia repair     Patient Active Problem List   Diagnosis Date Noted   Asymptomatic hypertensive urgency 04/05/2023   Osteopenia determined by x-ray 02/12/2023   Hyperlipidemia 01/10/2023   Prediabetes 01/10/2023   Primary hypertension 01/10/2023   Left ventricular hypertrophy 01/10/2023   Abnormal EKG 10/06/2022   Bone lesion_sclerotic lesions in the right iliac bone 10/06/2022    PCP: Dewaine Oats MD  REFERRING PROVIDER: Juanell Fairly MD   REFERRING DIAG: [phone] (ICD-10-CM) - Presence of left artificial hip joint   THERAPY DIAG:  History of left hip hemiarthroplasty  Weakness of left leg  Balance problems  Difficulty in walking, not elsewhere classified  Rationale for Evaluation and Treatment Rehabilitation  PERTINENT HISTORY: From initial eval: Christian Hughes is a 61 y.o. male without significant past medical history except for  occasional vaping amd marijuana use, who presents with fall and left hip pain.  Pt states that he slipped and fell at work at about 11:00 AM.  Denies loss of consciousness.  He injured his left hip, causing left hip pain, which is constant, sharp, severe, nonradiating, aggravated by movement.  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    "My mom took care of me after my hip surgery. I had therapy at home. I returned to my home alone after my first follow up appointment. I have pain in my buttocks, groin and the corner of my buttocks of about 2 to 3/10. I have high pain tolerance. I am unable to walk normal and I feel weak. I began to use this cane on my own after my PT at home stopped.  I can't wait to go to work because, I love to work."   PAIN:  Are you having pain? Yes: Pain location: 2-3/10 Pain description: stabbing Aggravating factors: Moving, walking, WB   Relieving factors: rest, OTC meds, ice    WEIGHT BEARING RESTRICTIONS: Yes:  WBAT   FALLS:  Has patient fallen in last 6 months? Yes. 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 Pt negotiates some small staircases in town. Pt has historically taken 24-packs of water bottles to his work for customers.    PLOF: Independent   PATIENT GOALS: "I want to return to work without hurting and falling as soon as I can."   PRECAUTIONS: Posterior hip precautions      OBJECTIVE: (objective measures completed at initial evaluation unless otherwise dated)  PALPATION: Pain  in L sacral border, Iliac crest, gluteus medius, TFL, and greater trochanter     LOWER EXTREMITY ROM:    Active ROM Right eval Left eval  Hip flexion WFL 20 deg with pain  Hip extension [facility]    Hip abduction WFL 20 with pain   Hip adduction [facility]    Hip internal rotation [facility]    Hip external [facility]    Knee flexion [facility]  Knee extension [facility]  Ankle dorsiflexion [facility]  Ankle plantarflexion [facility]  Ankle inversion [facility]  Ankle eversion WFL WFL   (Blank rows = not tested)   LOWER EXTREMITY MMT:   MMT Right eval Left eval Left 12/04/22 Left 12/18/22 Left 01/29/23 Left 03/15/23 Left 04/11/23 Left 04/30/23  Hip flexion WFL 2+ 4* 4- 4 4* 4* 4+  Hip extension WFL 2 3+  3+* 3+* 4 3+*  Hip abduction WFl 2 4-  3+* 4* 4* 3+*  Hip adduction WFl          Hip internal rotation [facility]          Hip external rotation [facility]          Knee flexion WFL 3- 3+ 4- 4 4+* 5 5  Knee extension WFl 3 4+ 4* 4* 4-* 4+ 5  Ankle dorsiflexion WFL 3+        Ankle plantarflexion WFL 4        Ankle inversion WFL 4        Ankle eversion WFL 3-         (Blank rows = not tested) Pt's L ankle remains inverted 10 degrees since the Surgery.     FUNCTIONAL TESTS (INITIAL EVALUATION) 30 seconds chair stand test: 5 reps completed Timed up and go (TUG): 19secs 10 meter walk test: 12secs= 1,2 m/secs             Balance Test : SL standing unable, Modified tandem 10 secs, Tandem Unable, Rhomberg with EO 5 secs and unable with EC.  GAIT: Distance walked: 30 ft  Assistive device utilized: Single point cane Level of assistance: SBA Comments: Pt unsteady on feet with severe antalgic gait, decreased loading response, absent heel strike and absent toe off gait with uneven stride and decreased speed. Heavy reliance on the [facility]   -----  03/27/23  Posture: Lumbar lordosis: WNL Iliac crest height: Equal bilaterally Lumbar lateral shift: Mild shift to R to offload LLE  AROM AROM (Normal range in degrees) AROM    Lumbar   Flexion (65) 75% (stopped at 90 deg hip flexion)  Extension (30) 50%  Right lateral flexion (25) 100%  Left lateral flexion (25) 100%  Right rotation (30) 75%  Left rotation (30) 75%      (* = pain; Blank rows = not tested)   Sensation Grossly intact to light touch  throughout bilateral LEs as determined by testing dermatomes L2-S2. Proprioception, stereognosis, and hot/cold testing deferred on this date.   Special Tests Lumbar Radiculopathy and Discogenic: Centralization and Peripheralization (SN 92, -LR 0.12): Not examined Slump (SN 83, -LR 0.32): R: Negative L: Negative SLR (SN 92, -LR 0.29): R: Negative L:  Negative  Facet Joint: Extension-Rotation (SN 100, -LR 0.0): R: Negative L: Positive  Lumbar Foraminal Stenosis: Lumbar quadrant (SN 70): R: Negative L: Positive      TODAY'S TREATMENT:                                                                                                                              DATE: 05/02/2023    L hip hemiarthroplasty following L femoral neck fracture (DOS: 10/07/22)   SUBJECTIVE STATEMENT:  Pt reports 2-2.5/10 baseline pain with some discomfort along medial groin region. Patient reports feeling better versus notable groin pain on Monday. Patient reports going home Monday and taking Ibuprofen/using ice; he reports doing better between yesterday and today.     Today's Vitals   05/02/23 1459  BP: (!) 141/80  Pulse: 72    There is no height or weight on file to calculate BMI.    Therapeutic Exercise - for improved soft tissue flexibility and extensibility as needed for ROM, improved strength as needed to improve performance of CKC activities/functional movements   Nu-Step L3. Seat at 12 to maintain posterior hip flexion precautions. 5 minutes, for warm up  Glute bridge with Green physioball under calves; 3x10  Alternating arms and legs with abdominal brace with Green physioball; x20 alternating R/L  Prone hip  extension, alternating; 2x10 alt; 2-lb ankle weights  Alternating hip flexion with Tband, in standing today; 2x10 alternating R/L with Red Tband  Side steps along blue agility ladder: x4 D/B with Blue Tband looped at superior pole of patellae   Lateral step up to 6-inch step; staircase in center of gym; 1x15  Forward step over (3) 6-inch hurdles and (2) 12-inch hurdles, unsupported in open gym environment; 3x D/B with reciprocal pattern      PATIENT EDUCATION: We discussed expected progression with PT; encouraged continued HEP including hip flexor and adductor stretches reviewed last visit.    *not today* Butterfly stretch, technique reviewed for HEP Modified Thomas hip flexor stretch, technique reviewed for HEP Cold pack (unbilled) - for anti-inflammatory and analgesic effect as needed for reduced pain and improved ability to participate in active PT intervention, along L anterior in supine with bolster under knees for relaxation, x 5 minutes Forward step up to Airex pad; 1x15 surgical LE leading  Sidelying hip abduction, no weight; 2x8 Prone knee bend, dynamic stretch; 1x10, 1 sec; for review Toe tapping; 2x10 alternating R/L with 12-inch step, no UE support; stance on Airex padThomas hip flexor stretch; reviewed Butterfly stretch in supine; reviewed Manual hip flexor stretch in R sidelying, gentle (hip to neutral); 2 x 30 sec Standing march with 5-lb  ankle weights; 2 x 10 alternating, holding treadmill armrest Sit to stand: 1x6 and 1x4; from chair with 8-lb Goblet hold  Unipedal stance; 2 x 15 sec Bridge 3x12:  Black Tband at distal femurs with good carryover in form/technique.  Butterfly stretch; 3 x 20 sec        PATIENT EDUCATION:  Education details: see above for patient education details Person educated: Patient Education method: Explanation and Demonstration Education comprehension: verbalized understanding   HOME EXERCISE PROGRAM: Access Code: [email or web address] URL:  [email or web address] Date: 04/18/2023 Prepared by: Consuela Mimes  Exercises - Sit to Stand  - 1 x daily - 7 x weekly - 2 sets - 10 reps - Supine Bridge with Resistance Band  - 1 x daily - 7 x weekly - 2 sets - 10 reps - Sidelying Hip Abduction  - 1 x daily - 7 x weekly - 2 sets - 10 reps - Prone Hip Extension  - 1 x daily - 7 x weekly - 2 sets - 10 reps - Supine Hip Flexion with Resistance Loop  - 1 x daily - 7 x weekly - 2 sets - 10 reps - Prone Femoral Nerve Mobilization  - 2 x daily - 7 x weekly - 2 sets - 10 reps - 1sec hold     ASSESSMENT:   CLINICAL IMPRESSION:  Patient fortunately feels better than earlier this week during which he was experiencing notable groin pain limiting activity tolerance. Pt does have persistent medial groin pain that is generally low level at rest, but it can be aggravated with activity and does limit some performance in PT. We have focused on graded activity, stretching, ice prn, and activity modifications prn. We have added lumbar/trunk strengthening with exercises being generally well tolerated. We are able to resume moderate volume of weightbearing work today. 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 Pt has remaining deficits in hip flexor and adductor flexibility/extensibility, hip ABD/EXT strength, weightbearing tolerance, gait deviations, and limitations with negotiating steps/curbs/uneven terrain. Patient will benefit from continued skilled therapeutic intervention to address the remaining deficits in hip strength, mobility, and weightbearing tolerance as needed for improved function and ability to perform work duties.      OBJECTIVE IMPAIRMENTS: Abnormal gait, decreased activity tolerance, decreased  balance, decreased endurance, decreased knowledge of condition, decreased knowledge of use of DME, decreased mobility, difficulty walking, decreased ROM, decreased strength, and dizziness.    ACTIVITY LIMITATIONS: carrying, lifting, bending, sitting, standing, squatting, sleeping, stairs, transfers, dressing, and locomotion level   PARTICIPATION LIMITATIONS: cleaning, shopping, community activity, occupation, and yard work   PERSONAL FACTORS:  Pt is impulsive and eager to get better so that he can return to work.   are also affecting patient's functional outcome.    REHAB POTENTIAL: Good   CLINICAL DECISION MAKING: Stable/uncomplicated   EVALUATION COMPLEXITY: Low     GOALS: Goals reviewed with patient? Yes   SHORT TERM GOALS: Target date: 12/22/2022 Pt will improve L hip ROM to 10 to 90 degrees without pain in order to promote normal gait and stability.  Baseline: 20 degree Hip felx and 0 deg Ext 12/04/22: 90 deg hip flex and 5 deg EXT 12/18/22: 90 deg hip flex and 10 deg hip EXT Goal status: ACHIEVED    2.  Pt will be able to ambulate 575ft  safely with Cane without fall and pain . Baseline: 30 ft with SBA and heavy reliance on [facility].   12/04/22: Pt uses SPC for primary means of mobility in home and community at this time.  Goal status: ACHIEVED       LONG TERM GOALS: Target date: 01/26/2023   Pt will Improve TUG time to <10secs without SPC to demonstrate decreased fall risk.  Baseline: 19 secs with SPC 12/04/22: 14.6 seconds with SPC 12/18/22: 9.5 sec Goal status: ACHIEVED   2.  Pt will Improve LEFS score to >65/80 to demonstrate improved QoL Baseline: 18/80 12/04/22: 20/80  12/18/22: 22/80 01/29/23: 25/80  03/15/23: 24/80 04/09/23: 27/80  04/30/23: 26/80 Goal status: ON-GOING   3.  Pt will be able to ambulate Independently  with LRAD without LOB and pain in order to return to community level mobility safely  Baseline: With [facility] and [facility].   12/04/22: ModI ambulation with SPC/LRAD with  mild pain at longer distances, no LOB 12/18/22: Amb IND with SPC with no LOB Goal status: ACHIEVED    4.  Pt will score >50 on FOTO Baseline: 36/56 12/04/22: 35/56 12/18/22: 37/56 01/29/23: 43/56 03/15/23: 38/56 04/09/23: 43/56 04/30/23: 41/56 Goal status: IN PROGRESS   5.  Pt will demonstrate 4/5 L hip strength without pain to improve stability and safety with all functional mobility in order to return to work.  Baseline: 2/5 grossly    12/04/22: 4-/5 grossly 12/18/22: 4-/5 grossly 01/29/23: 4/5 for hip flexors/quads/HS, 3+/5 for hip ABD and EXT.  03/15/23: Remaining weakness in hip flexors, quads, abductors, extensors.  04/09/23: Improved strength with pt up to at least 4/5, pain with loading hip flexors and abductors  04/30/23: Weak hip abduction and extension MMT with heightened pain; good strength otherwise Goal status: IN PROGRESS         PLAN:   PT FREQUENCY: 2x/week   PT DURATION: 4 weeks   PLANNED INTERVENTIONS: Therapeutic exercises, Therapeutic activity, Neuromuscular re-education, Balance training, Gait training, Patient/Family education, Self Care, and Joint mobilization   PLAN FOR NEXT SESSION: Hip extensor and abductor strengthening with integration of trunk stabilization work, adductor and hip flexor stretching, progressive weightbearing activity, graded introduction of closed-chain loading. Modalities as needed for pain control.     Consuela Mimes, PT, DPT [phone] Physical Therapist- [facility]  05/02/2023, 3:43 PM

## 2023-05-02 NOTE — Addendum Note (Signed)
Addended by: Gertie Exon on: 05/02/2023 08:34 AM   Modules accepted: Orders

## 2023-05-07 ENCOUNTER — Ambulatory Visit: Payer: Worker's Compensation | Attending: Orthopedic Surgery | Admitting: Physical Therapy

## 2023-05-07 ENCOUNTER — Encounter: Payer: Self-pay | Admitting: Physical Therapy

## 2023-05-07 VITALS — BP 147/96 | HR 62

## 2023-05-07 DIAGNOSIS — R262 Difficulty in walking, not elsewhere classified: Secondary | ICD-10-CM | POA: Diagnosis present

## 2023-05-07 DIAGNOSIS — R29898 Other symptoms and signs involving the musculoskeletal system: Secondary | ICD-10-CM | POA: Insufficient documentation

## 2023-05-07 DIAGNOSIS — R2689 Other abnormalities of gait and mobility: Secondary | ICD-10-CM | POA: Diagnosis present

## 2023-05-07 DIAGNOSIS — Z96642 Presence of left artificial hip joint: Secondary | ICD-10-CM | POA: Diagnosis present

## 2023-05-07 NOTE — Therapy (Signed)
OUTPATIENT PHYSICAL THERAPY TREATMENT  Patient Name: Christian Hughes MRN: [id number] DOB:May 25, 1961, 62 y.o., male Today's Date: 05/07/2023   END OF SESSION:   PT End of Session - 05/07/23 1451     Visit Number 37    Number of Visits 43    Authorization Type Workers Compensation    PT Start Time 1453    PT Stop Time 1543    PT Time Calculation (min) 50 min    Activity Tolerance Patient tolerated treatment well;Patient limited by pain    Behavior During Therapy WFL for tasks assessed/performed;Impulsive             Past Medical History:  Diagnosis Date   Engages in vaping    H/O ventral hernia    S/p of repair   Hypertension Early 2010's   Marijuana use    Orthostatic dizziness 10/08/2022   Past Surgical History:  Procedure Laterality Date   HIP ARTHROPLASTY Left 10/07/2022   Procedure: ARTHROPLASTY BIPOLAR HIP (HEMIARTHROPLASTY);  Surgeon: Christian Fairly, MD;  Location: [facility] ORS;  Service: Orthopedics;  Laterality: Left;   JOINT REPLACEMENT  10/06/2022   Left Hip Replacement due fall while at work   Ventral hernia repair     Patient Active Problem List   Diagnosis Date Noted   Asymptomatic hypertensive urgency 04/05/2023   Osteopenia determined by x-ray 02/12/2023   Hyperlipidemia 01/10/2023   Prediabetes 01/10/2023   Primary hypertension 01/10/2023   Left ventricular hypertrophy 01/10/2023   Abnormal EKG 10/06/2022   Bone lesion_sclerotic lesions in the right iliac bone 10/06/2022    PCP: Christian Oats MD  REFERRING PROVIDER: Juanell Fairly MD   REFERRING DIAG: [phone] (ICD-10-CM) - Presence of left artificial hip joint   THERAPY DIAG:  History of left hip hemiarthroplasty  Weakness of left leg  Balance problems  Difficulty in walking, not elsewhere classified  Rationale for Evaluation and Treatment Rehabilitation  PERTINENT HISTORY: From initial eval: Christian Hughes is a 62 y.o. male without significant past medical history except for occasional  vaping amd marijuana use, who presents with fall and left hip pain.  Pt states that he slipped and fell at work at about 11:00 AM.  Denies loss of consciousness.  He injured his left hip, causing left hip pain, which is constant, sharp, severe, nonradiating, aggravated by movement.  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    "My mom took care of me after my hip surgery. I had therapy at home. I returned to my home alone after my first follow up appointment. I have pain in my buttocks, groin and the corner of my buttocks of about 2 to 3/10. I have high pain tolerance. I am unable to walk normal and I feel weak. I began to use this cane on my own after my PT at home stopped.  I can't wait to go to work because, I love to work."   PAIN:  Are you having pain? Yes: Pain location: 2-3/10 Pain description: stabbing Aggravating factors: Moving, walking, WB   Relieving factors: rest, OTC meds, ice    WEIGHT BEARING RESTRICTIONS: Yes:  WBAT   FALLS:  Has patient fallen in last 6 months? Yes. 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 Pt negotiates some small staircases in town. Pt has historically taken 24-packs of water bottles to his work for customers.    PLOF: Independent   PATIENT GOALS: "I want to return to work without hurting and falling as soon as I can."   PRECAUTIONS: Posterior hip precautions      OBJECTIVE: (objective measures completed at initial evaluation unless otherwise dated)  PALPATION: Pain in L sacral  border, Iliac crest, gluteus medius, TFL, and greater trochanter     LOWER EXTREMITY ROM:    Active ROM Right eval Left eval  Hip flexion WFL 20 deg with pain  Hip extension [facility]    Hip abduction WFL 20 with pain   Hip adduction [facility]    Hip internal rotation [facility]    Hip external rotation [facility]    Knee flexion [facility]  Knee extension [facility]  Ankle dorsiflexion [facility]  Ankle plantarflexion [facility]  Ankle inversion [facility]  Ankle eversion WFL WFL   (Blank rows = not tested)   LOWER EXTREMITY MMT:   MMT Right eval Left eval Left 12/04/22 Left 12/18/22 Left 01/29/23 Left 03/15/23 Left 04/11/23 Left 04/30/23  Hip flexion WFL 2+ 4* 4- 4 4* 4* 4+  Hip extension WFL 2 3+  3+* 3+* 4 3+*  Hip abduction WFl 2 4-  3+* 4* 4* 3+*  Hip adduction WFl          Hip internal rotation [facility]          Hip external rotation [facility]          Knee flexion WFL 3- 3+ 4- 4 4+* 5 5  Knee extension WFl 3 4+ 4* 4* 4-* 4+ 5  Ankle dorsiflexion WFL 3+        Ankle plantarflexion WFL 4        Ankle inversion WFL 4        Ankle eversion WFL 3-         (Blank rows = not tested) Pt's L ankle remains inverted 10 degrees since the Surgery.     FUNCTIONAL TESTS (INITIAL EVALUATION) 30 seconds chair stand test: 5 reps completed Timed up and go (TUG): 19secs 10 meter walk test: 12secs= 1,2 m/secs             Balance Test : SL standing unable, Modified tandem 10 secs, Tandem Unable, Rhomberg with EO 5 secs and unable with EC.  GAIT: Distance walked: 30 ft  Assistive device utilized: Single point cane Level of assistance: SBA Comments: Pt unsteady on feet with severe antalgic gait, decreased loading response, absent heel strike and absent toe off gait with uneven stride and decreased speed. Heavy reliance on the [facility]   -----  03/27/23  Posture: Lumbar lordosis: WNL Iliac crest height: Equal bilaterally Lumbar lateral shift: Mild shift to R to offload LLE  AROM AROM (Normal range in degrees) AROM   Lumbar    Flexion (65) 75% (stopped at 90 deg hip flexion)  Extension (30) 50%  Right lateral flexion (25) 100%  Left lateral flexion (25) 100%  Right rotation (30) 75%  Left rotation (30) 75%      (* = pain; Blank rows = not tested)   Sensation Grossly intact to light touch throughout bilateral  LEs as determined by testing dermatomes L2-S2. Proprioception, stereognosis, and hot/cold testing deferred on this date.   Special Tests Lumbar Radiculopathy and Discogenic: Centralization and Peripheralization (SN 92, -LR 0.12): Not examined Slump (SN 83, -LR 0.32): R: Negative L: Negative SLR (SN 92, -LR 0.29): R: Negative L:  Negative  Facet Joint: Extension-Rotation (SN 100, -LR 0.0): R: Negative L: Positive  Lumbar Foraminal Stenosis: Lumbar quadrant (SN 70): R: Negative L: Positive      TODAY'S TREATMENT:                                                                                                                              DATE: 05/07/2023    L hip hemiarthroplasty following L femoral neck fracture (DOS: 10/07/22)   SUBJECTIVE STATEMENT:  Pt reports 2-2.5/10 medial groin discomfort - feels like baseball stuck in groin region per patient. Patient feels that current antalgic/compensated gait may be his new norm.    Today's Vitals   05/07/23 1455 05/07/23 1522 05/07/23 1526  BP: (!) 171/86 (!) 169/103 (!) 147/96  Pulse: 61 62      There is no height or weight on file to calculate BMI.    Therapeutic Exercise - for improved soft tissue flexibility and extensibility as needed for ROM, improved strength as needed to improve performance of CKC activities/functional movements   Nu-Step L2-3. Seat at 13 to maintain posterior hip flexion precautions. 5 minutes, for warm up  -intermittent pause due to groin pain, seat adjusted to reduce hip flexion   Glute bridge with Green physioball under calves; 3x10  Alternating arms and legs with abdominal brace with Green physioball; x20  alternating R/L  Alternating hip flexion with Tband, in standing today; 2x8 alternating R/L with Red Tband  Forward step-up to opposite LE tap on next step; 2x10, 6-inch step (staircase center of clinic)  Side steps along blue agility ladder: x2 D/B with Blue Tband looped at superior pole of patellae  -attempted with minisquat isometric for first rep, regressed back to regular side step    PATIENT EDUCATION: We discussed current progress and anticipated progression of PT.     Cold pack (unbilled) - for anti-inflammatory and analgesic effect as needed for reduced pain and improved ability to participate in active PT intervention, along L anterior in supine with bolster under knees for relaxation, x 5 minutes    *not today* Lateral step up to 6-inch step; staircase in center of gym; 1x12 Forward step over (3) 6-inch hurdles and (2) 12-inch hurdles, unsupported in open gym environment; 3x D/B with reciprocal patternProne hip extension, alternating; 2x10 alt; 2-lb ankle weights Butterfly stretch, technique reviewed for HEP Modified Thomas hip flexor stretch, technique reviewed for HEP Forward step up to Airex pad; 1x15 surgical LE leading  Sidelying hip abduction, no weight; 2x8 Prone knee bend, dynamic stretch; 1x10, 1 sec; for review Toe tapping; 2x10 alternating R/L with 12-inch step, no UE support; stance on Airex  padThomas hip flexor stretch; reviewed Butterfly stretch in supine; reviewed Manual hip flexor stretch in R sidelying, gentle (hip to neutral); 2 x 30 sec Standing march with 5-lb ankle weights; 2 x 10 alternating, holding treadmill armrest Sit to stand: 1x6 and 1x4; from chair with 8-lb Goblet hold  Unipedal stance; 2 x 15 sec Bridge 3x12:  Black Tband at distal femurs with good carryover in form/technique.  Butterfly stretch; 3 x 20 sec        PATIENT EDUCATION:  Education details: see above for patient education details Person educated: Patient Education  method: Explanation and Demonstration Education comprehension: verbalized understanding   HOME EXERCISE PROGRAM: Access Code: [email or web address] URL: [email or web address] Date: 04/18/2023 Prepared by: Consuela Mimes  Exercises - Sit to Stand  - 1 x daily - 7 x weekly - 2 sets - 10 reps - Supine Bridge with Resistance Band  - 1 x daily - 7 x weekly - 2 sets - 10 reps - Sidelying Hip Abduction  - 1 x daily - 7 x weekly - 2 sets - 10 reps - Prone Hip Extension  - 1 x daily - 7 x weekly - 2 sets - 10 reps - Supine Hip Flexion with Resistance Loop  - 1 x daily - 7 x weekly - 2 sets - 10 reps - Prone Femoral Nerve Mobilization  - 2 x daily - 7 x weekly - 2 sets - 10 reps - 1sec hold     ASSESSMENT:   CLINICAL IMPRESSION:  Patient has continuous 2-3/10 groin pain medially and ongoing limitations with weightbearing activity and loading hip flexors/gluteal musculature. Pt does have remaining compensated Trendelenburg and will benefit from further gluteal strengthening as tolerated. Pt does have high blood pressure and one measure with DBP > 100 mmHg requiring re-measuring to ensure safety. Final BP reading was not contraindicative of exercise. Pt has remaining deficits in hip flexor and adductor flexibility/extensibility, hip ABD/EXT strength, weightbearing tolerance, gait deviations, and limitations with negotiating steps/curbs/uneven terrain. Patient will benefit from continued skilled therapeutic intervention to address the remaining deficits in hip strength, mobility, and weightbearing tolerance as needed for improved function and ability to perform work duties.      OBJECTIVE IMPAIRMENTS: Abnormal gait, decreased activity tolerance, decreased balance, decreased endurance, decreased knowledge of condition, decreased knowledge of use of DME, decreased mobility, difficulty walking, decreased ROM, decreased strength, and dizziness.    ACTIVITY LIMITATIONS: carrying, lifting, bending,  sitting, standing, squatting, sleeping, stairs, transfers, dressing, and locomotion level   PARTICIPATION LIMITATIONS: cleaning, shopping, community activity, occupation, and yard work   PERSONAL FACTORS:  Pt is impulsive and eager to get better so that he can return to work.   are also affecting patient's functional outcome.    REHAB POTENTIAL: Good   CLINICAL DECISION MAKING: Stable/uncomplicated   EVALUATION COMPLEXITY: Low     GOALS: Goals reviewed with patient? Yes   SHORT TERM GOALS: Target date: 12/22/2022 Pt will improve L hip ROM to 10 to 90 degrees without pain in order to promote normal gait and stability.  Baseline: 20 degree Hip felx and 0 deg Ext 12/04/22: 90 deg hip flex and 5 deg EXT 12/18/22: 90 deg hip flex and 10 deg hip EXT Goal status: ACHIEVED    2.  Pt will be able to ambulate 558ft safely with Cane without fall and pain . Baseline: 30 ft with SBA and heavy reliance on [facility].   12/04/22: Pt uses SPC for primary means of mobility in home and  community at this time.  Goal status: ACHIEVED       LONG TERM GOALS: Target date: 01/26/2023   Pt will Improve TUG time to <10secs without SPC to demonstrate decreased fall risk.  Baseline: 19 secs with SPC 12/04/22: 14.6 seconds with SPC 12/18/22: 9.5 sec Goal status: ACHIEVED   2.  Pt will Improve LEFS score to >65/80 to demonstrate improved QoL Baseline: 18/80 12/04/22: 20/80  12/18/22: 22/80 01/29/23: 25/80  03/15/23: 24/80 04/09/23: 27/80  04/30/23: 26/80 Goal status: ON-GOING   3.  Pt will be able to ambulate Independently  with LRAD without LOB and pain in order to return to community level mobility safely  Baseline: With [facility] and FWW.   12/04/22: ModI ambulation with SPC/LRAD with mild pain at longer distances, no LOB 12/18/22: Amb IND with SPC with no LOB Goal status: ACHIEVED    4.  Pt will score >50 on FOTO Baseline: 36/56 12/04/22: 35/56 12/18/22: 37/56 01/29/23: 43/56 03/15/23: 38/56 04/09/23: 43/56 04/30/23:  41/56 Goal status: IN PROGRESS   5.  Pt will demonstrate 4/5 L hip strength without pain to improve stability and safety with all functional mobility in order to return to work.  Baseline: 2/5 grossly    12/04/22: 4-/5 grossly 12/18/22: 4-/5 grossly 01/29/23: 4/5 for hip flexors/quads/HS, 3+/5 for hip ABD and EXT.  03/15/23: Remaining weakness in hip flexors, quads, abductors, extensors.  04/09/23: Improved strength with pt up to at least 4/5, pain with loading hip flexors and abductors  04/30/23: Weak hip abduction and extension MMT with heightened pain; good strength otherwise Goal status: IN PROGRESS         PLAN:   PT FREQUENCY: 2x/week   PT DURATION: 4 weeks   PLANNED INTERVENTIONS: Therapeutic exercises, Therapeutic activity, Neuromuscular re-education, Balance training, Gait training, Patient/Family education, Self Care, and Joint mobilization   PLAN FOR NEXT SESSION: Hip extensor and abductor strengthening with integration of trunk stabilization work, adductor and hip flexor stretching, progressive weightbearing activity, graded introduction of closed-chain loading. Modalities as needed for pain control.     Consuela Mimes, PT, DPT [phone] Physical Therapist- [facility]  05/07/2023, 3:44 PM

## 2023-05-09 ENCOUNTER — Ambulatory Visit: Payer: Worker's Compensation | Admitting: Physical Therapy

## 2023-05-09 VITALS — BP 139/88 | HR 71

## 2023-05-09 DIAGNOSIS — R262 Difficulty in walking, not elsewhere classified: Secondary | ICD-10-CM

## 2023-05-09 DIAGNOSIS — Z96642 Presence of left artificial hip joint: Secondary | ICD-10-CM

## 2023-05-09 DIAGNOSIS — R29898 Other symptoms and signs involving the musculoskeletal system: Secondary | ICD-10-CM

## 2023-05-09 DIAGNOSIS — R2689 Other abnormalities of gait and mobility: Secondary | ICD-10-CM

## 2023-05-09 NOTE — Therapy (Signed)
OUTPATIENT PHYSICAL THERAPY TREATMENT  Patient Name: Christian Hughes MRN: [id number] DOB:[birth year], 62 y.o., male Today's Date: 05/09/2023   END OF SESSION:   PT End of Session - 05/09/23 1527     Visit Number 38    Number of Visits 43    Authorization Type Workers Compensation    PT Start Time 1504    PT Stop Time 1546    PT Time Calculation (min) 42 min    Activity Tolerance Patient tolerated treatment well;Patient limited by pain    Behavior During Therapy WFL for tasks assessed/performed;Impulsive              Past Medical History:  Diagnosis Date   Engages in vaping    H/O ventral hernia    S/p of repair   Hypertension Early 2010's   Marijuana use    Orthostatic dizziness 10/08/2022   Past Surgical History:  Procedure Laterality Date   HIP ARTHROPLASTY Left 10/07/2022   Procedure: ARTHROPLASTY BIPOLAR HIP (HEMIARTHROPLASTY);  Surgeon: Juanell Fairly, MD;  Location: [facility] ORS;  Service: Orthopedics;  Laterality: Left;   JOINT REPLACEMENT  10/06/2022   Left Hip Replacement due fall while at work   Ventral hernia repair     Patient Active Problem List   Diagnosis Date Noted   Asymptomatic hypertensive urgency 04/05/2023   Osteopenia determined by x-ray 02/12/2023   Hyperlipidemia 01/10/2023   Prediabetes 01/10/2023   Primary hypertension 01/10/2023   Left ventricular hypertrophy 01/10/2023   Abnormal EKG 10/06/2022   Bone lesion_sclerotic lesions in the right iliac bone 10/06/2022    PCP: Dewaine Oats MD  REFERRING PROVIDER: Juanell Fairly MD   REFERRING DIAG: [id number] (ICD-10-CM) - Presence of left artificial hip joint   THERAPY DIAG:  History of left hip hemiarthroplasty  Weakness of left leg  Balance problems  Difficulty in walking, not elsewhere classified  Rationale for Evaluation and Treatment Rehabilitation  PERTINENT HISTORY: From initial eval: CATON POPOWSKI is a 62 y.o. male without significant past medical history except for  occasional vaping amd marijuana use, who presents with fall and left hip pain.  Pt states that he slipped and fell at work at about 11:00 AM.  Denies loss of consciousness.  He injured his left hip, causing left hip pain, which is constant, sharp, severe, nonradiating, aggravated by movement.  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    "My mom took care of me after my hip surgery. I had therapy at home. I returned to my home alone after my first follow up appointment. I have pain in my buttocks, groin and the corner of my buttocks of about 2 to 3/10. I have high pain tolerance. I am unable to walk normal and I feel weak. I began to use this cane on my own after my PT at home stopped.  I can't wait to go to work because, I love to work."   PAIN:  Are you having pain? Yes: Pain location: 2-3/10 Pain description: stabbing Aggravating factors: Moving, walking, WB   Relieving factors: rest, OTC meds, ice    WEIGHT BEARING RESTRICTIONS: Yes:  WBAT   FALLS:  Has patient fallen in last 6 months? Yes. 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 Pt negotiates some small staircases in town. Pt has historically taken 24-packs of water bottles to his work for customers.    PLOF: Independent   PATIENT GOALS: "I want to return to work without hurting and falling as soon as I can."   PRECAUTIONS: Posterior hip precautions      OBJECTIVE: (objective measures completed at initial evaluation unless otherwise dated)  PALPATION: Pain  in L sacral border, Iliac crest, gluteus medius, TFL, and greater trochanter     LOWER EXTREMITY ROM:    Active ROM Right eval Left eval  Hip flexion WFL 20 deg with pain  Hip extension [facility]    Hip abduction WFL 20 with pain   Hip adduction [facility]    Hip internal rotation [facility]    Hip external rotation [facility]    Knee flexion [facility]  Knee extension [facility]  Ankle dorsiflexion [facility]  Ankle plantarflexion [facility]  Ankle inversion [facility]  Ankle eversion WFL WFL   (Blank rows = not tested)   LOWER EXTREMITY MMT:   MMT Right eval Left eval Left 12/04/22 Left 12/18/22 Left 01/29/23 Left 03/15/23 Left 04/11/23 Left 04/30/23  Hip flexion WFL 2+ 4* 4- 4 4* 4* 4+  Hip extension WFL 2 3+  3+* 3+* 4 3+*  Hip abduction WFl 2 4-  3+* 4* 4* 3+*  Hip adduction WFl          Hip internal rotation [facility]          Hip external rotation [facility]          Knee flexion WFL 3- 3+ 4- 4 4+* 5 5  Knee extension WFl 3 4+ 4* 4* 4-* 4+ 5  Ankle dorsiflexion WFL 3+        Ankle plantarflexion WFL 4        Ankle inversion WFL 4        Ankle eversion WFL 3-         (Blank rows = not tested) Pt's L ankle remains inverted 10 degrees since the Surgery.     FUNCTIONAL TESTS (INITIAL EVALUATION) 30 seconds chair stand test: 5 reps completed Timed up and go (TUG): 19secs 10 meter walk test: 12secs= 1,2 m/secs             Balance Test : SL standing unable, Modified tandem 10 secs, Tandem Unable, Rhomberg with EO 5 secs and unable with EC.  GAIT: Distance walked: 30 ft  Assistive device utilized: Single point cane Level of assistance: SBA Comments: Pt unsteady on feet with severe antalgic gait, decreased loading response, absent heel strike and absent toe off gait with uneven stride and decreased speed. Heavy reliance on the [facility]   -----  03/27/23  Posture: Lumbar lordosis: WNL Iliac crest height: Equal bilaterally Lumbar lateral shift: Mild shift to R to offload LLE  AROM AROM (Normal range in degrees) AROM    Lumbar   Flexion (65) 75% (stopped at 90 deg hip flexion)  Extension (30) 50%  Right lateral flexion (25) 100%  Left lateral flexion (25) 100%  Right rotation (30) 75%  Left rotation (30) 75%      (* = pain; Blank rows = not tested)   Sensation Grossly intact to light touch throughout  bilateral LEs as determined by testing dermatomes L2-S2. Proprioception, stereognosis, and hot/cold testing deferred on this date.   Special Tests Lumbar Radiculopathy and Discogenic: Centralization and Peripheralization (SN 92, -LR 0.12): Not examined Slump (SN 83, -LR 0.32): R: Negative L: Negative SLR (SN 92, -LR 0.29): R: Negative L:  Negative  Facet Joint: Extension-Rotation (SN 100, -LR 0.0): R: Negative L: Positive  Lumbar Foraminal Stenosis: Lumbar quadrant (SN 70): R: Negative L: Positive      TODAY'S TREATMENT:                                                                                                                              DATE: 05/09/2023    L hip hemiarthroplasty following L femoral neck fracture (DOS: 10/07/22)   SUBJECTIVE STATEMENT:  Pt reports having more pain this AM and arrives with [facility] today. Patient reports having some pain/soreness Monday afternoon. Pt reports some notable cramping along L hip flexor Monday evening. Patient reports 5/10 pain affecting hip flexor and anterior thigh region.    Today's Vitals   05/09/23 1507  BP: 139/88  Pulse: 71  SpO2: 97%    There is no height or weight on file to calculate BMI.    IFC e-stim for pain control to improve patient's ability to tolerate ambulation and physical activity; beat low 80 Hz, beat high 150 Hz, carrier freq. 4000 Hz, 6.8 volts.  - 10 minutes   Cold pack (unbilled) - for anti-inflammatory and analgesic effect during e-stim as needed for reduced pain and improved ability to participate in active PT intervention, along L anterior in supine, x 10 minutes     Therapeutic Exercise - for improved  soft tissue flexibility and extensibility as needed for ROM, improved strength as needed to improve performance of CKC activities/functional movements   Nu-Step L2. Seat at 13 to maintain posterior hip flexion precautions. 5 minutes, for warm up  -intermittent pause due to groin pain, seat adjusted to reduce hip flexion   Ambulate laps around gym; 2 x 2 laps to check for pain with ambulation post-treatment  Lateral step up to 6-inch step; staircase in center of gym; 1x12  -pt has limited tolerance of CKC loading with vaulting off of LE on floor and lateral flexion to L when weight shifting to LLE   PATIENT EDUCATION: We discussed online resources for getting TENS unit as needed for pain control. We discussed use of modalities for pain control as short-term solution with use of exercise and graded activity for longer-term benefit. We discussed holding on significant hip flexor loading over next 2-3 days or until symptoms improve.    *next visit* Glute bridge with Green physioball under calves; 3x10 Side steps along blue agility ladder: x3 D/B with Green Tband looped at superior pole of patellae  -attempted with minisquat isometric for first rep, regressed back to regular side step   *not today* Alternating arms and legs with abdominal brace with Chilton Si  physioball; x20 alternating R/L Forward step-up to opposite LE tap on next step; 2x10, 6-inch step (staircase center of clinic) Alternating hip flexion with Tband, in standing today; 2x8 alternating R/L with Red Tband Forward step over (3) 6-inch hurdles and (2) 12-inch hurdles, unsupported in open gym environment; 3x D/B with reciprocal patternProne hip extension, alternating; 2x10 alt; 2-lb ankle weights Butterfly stretch, technique reviewed for HEP Modified Thomas hip flexor stretch, technique reviewed for HEP Forward step up to Airex pad; 1x15 surgical LE leading  Sidelying hip abduction, no weight; 2x8 Prone knee bend, dynamic  stretch; 1x10, 1 sec; for review Toe tapping; 2x10 alternating R/L with 12-inch step, no UE support; stance on Airex padThomas hip flexor stretch; reviewed Butterfly stretch in supine; reviewed Manual hip flexor stretch in R sidelying, gentle (hip to neutral); 2 x 30 sec Standing march with 5-lb ankle weights; 2 x 10 alternating, holding treadmill armrest Sit to stand: 1x6 and 1x4; from chair with 8-lb Goblet hold  Unipedal stance; 2 x 15 sec Bridge 3x12:  Black Tband at distal femurs with good carryover in form/technique.  Butterfly stretch; 3 x 20 sec        PATIENT EDUCATION:  Education details: see above for patient education details Person educated: Patient Education method: Explanation and Demonstration Education comprehension: verbalized understanding   HOME EXERCISE PROGRAM: Access Code: [email or web address] URL: [email or web address] Date: 04/18/2023 Prepared by: Consuela Mimes  Exercises - Sit to Stand  - 1 x daily - 7 x weekly - 2 sets - 10 reps - Supine Bridge with Resistance Band  - 1 x daily - 7 x weekly - 2 sets - 10 reps - Sidelying Hip Abduction  - 1 x daily - 7 x weekly - 2 sets - 10 reps - Prone Hip Extension  - 1 x daily - 7 x weekly - 2 sets - 10 reps - Supine Hip Flexion with Resistance Loop  - 1 x daily - 7 x weekly - 2 sets - 10 reps - Prone Femoral Nerve Mobilization  - 2 x daily - 7 x weekly - 2 sets - 10 reps - 1sec hold     ASSESSMENT:   CLINICAL IMPRESSION:  Patient unfortunately has experienced multiple exacerbations of groin pain without specific aggravating factor. We elected to hold on hip flexor loading due to significant TTP and hyperalgesia along hip flexor group today. While graded loading can improve sensitivity of affected muscle group, pt will likely need to hold on loading hip flexors temporarily due to current irritability of symptoms and heightened pain. Pt does have reduced pain today with use of e-stim/IFC. We reviewed online  resources for using e-stim as needed for pain control. Pt may also continue to use ice and OTC medications as instructed by physician for pain control at home. We will resume further progressive exercise with successive visits as able; exercise volume was minimal today due to heightened pain. Pt has remaining deficits in hip flexor and adductor flexibility/extensibility, hip ABD/EXT strength, weightbearing tolerance, gait deviations, and limitations with negotiating steps/curbs/uneven terrain. Patient will benefit from continued skilled therapeutic intervention to address the remaining deficits in hip strength, mobility, and weightbearing tolerance as needed for improved function and ability to perform work duties.      OBJECTIVE IMPAIRMENTS: Abnormal gait, decreased activity tolerance, decreased balance, decreased endurance, decreased knowledge of condition, decreased knowledge of use of DME, decreased mobility, difficulty walking, decreased ROM, decreased strength, and dizziness.    ACTIVITY LIMITATIONS: carrying, lifting, bending, sitting,  standing, squatting, sleeping, stairs, transfers, dressing, and locomotion level   PARTICIPATION LIMITATIONS: cleaning, shopping, community activity, occupation, and yard work   PERSONAL FACTORS:  Pt is impulsive and eager to get better so that he can return to work.   are also affecting patient's functional outcome.    REHAB POTENTIAL: Good   CLINICAL DECISION MAKING: Stable/uncomplicated   EVALUATION COMPLEXITY: Low     GOALS: Goals reviewed with patient? Yes   SHORT TERM GOALS: Target date: 12/22/2022 Pt will improve L hip ROM to 10 to 90 degrees without pain in order to promote normal gait and stability.  Baseline: 20 degree Hip felx and 0 deg Ext 12/04/22: 90 deg hip flex and 5 deg EXT 12/18/22: 90 deg hip flex and 10 deg hip EXT Goal status: ACHIEVED    2.  Pt will be able to ambulate 562ft safely with Cane without fall and pain . Baseline: 30 ft  with SBA and heavy reliance on [facility].   12/04/22: Pt uses SPC for primary means of mobility in home and community at this time.  Goal status: ACHIEVED       LONG TERM GOALS: Target date: 01/26/2023   Pt will Improve TUG time to <10secs without SPC to demonstrate decreased fall risk.  Baseline: 19 secs with SPC 12/04/22: 14.6 seconds with SPC 12/18/22: 9.5 sec Goal status: ACHIEVED   2.  Pt will Improve LEFS score to >65/80 to demonstrate improved QoL Baseline: 18/80 12/04/22: 20/80  12/18/22: 22/80 01/29/23: 25/80  03/15/23: 24/80 04/09/23: 27/80  04/30/23: 26/80 Goal status: ON-GOING   3.  Pt will be able to ambulate Independently  with LRAD without LOB and pain in order to return to community level mobility safely  Baseline: With [facility] and F[facility].   12/04/22: ModI ambulation with SPC/LRAD with mild pain at longer distances, no LOB 12/18/22: Amb IND with SPC with no LOB Goal status: ACHIEVED    4.  Pt will score >50 on FOTO Baseline: 36/56 12/04/22: 35/56 12/18/22: 37/56 01/29/23: 43/56 03/15/23: 38/56 04/09/23: 43/56 04/30/23: 41/56 Goal status: IN PROGRESS   5.  Pt will demonstrate 4/5 L hip strength without pain to improve stability and safety with all functional mobility in order to return to work.  Baseline: 2/5 grossly    12/04/22: 4-/5 grossly 12/18/22: 4-/5 grossly 01/29/23: 4/5 for hip flexors/quads/HS, 3+/5 for hip ABD and EXT.  03/15/23: Remaining weakness in hip flexors, quads, abductors, extensors.  04/09/23: Improved strength with pt up to at least 4/5, pain with loading hip flexors and abductors  04/30/23: Weak hip abduction and extension MMT with heightened pain; good strength otherwise Goal status: IN PROGRESS         PLAN:   PT FREQUENCY: 2x/week   PT DURATION: 4 weeks   PLANNED INTERVENTIONS: Therapeutic exercises, Therapeutic activity, Neuromuscular re-education, Balance training, Gait training, Patient/Family education, Self Care, and Joint mobilization   PLAN FOR NEXT SESSION:  Hip extensor and abductor strengthening with integration of trunk stabilization work, adductor and hip flexor stretching, progressive weightbearing activity, graded introduction of closed-chain loading. Modalities as needed for pain control.     Consuela Mimes, PT, DPT [phone] Physical Therapist- [facility]  05/09/2023, 3:28 PM

## 2023-05-14 ENCOUNTER — Ambulatory Visit: Payer: Worker's Compensation | Admitting: Physical Therapy

## 2023-05-14 DIAGNOSIS — R262 Difficulty in walking, not elsewhere classified: Secondary | ICD-10-CM

## 2023-05-14 DIAGNOSIS — M25552 Pain in left hip: Secondary | ICD-10-CM

## 2023-05-14 DIAGNOSIS — R29898 Other symptoms and signs involving the musculoskeletal system: Secondary | ICD-10-CM

## 2023-05-14 DIAGNOSIS — Z96642 Presence of left artificial hip joint: Secondary | ICD-10-CM

## 2023-05-14 DIAGNOSIS — R2689 Other abnormalities of gait and mobility: Secondary | ICD-10-CM

## 2023-05-14 NOTE — Therapy (Unsigned)
OUTPATIENT PHYSICAL THERAPY TREATMENT  Patient Name: Christian Hughes MRN: [id number] DOB:07-Mar-1961, 62 y.o., male Today's Date: 05/14/2023   END OF SESSION:   PT End of Session - 05/14/23 1516     Visit Number 39    Number of Visits 43    Authorization Type Workers Compensation    PT Start Time 1512    PT Stop Time 1552    PT Time Calculation (min) 40 min    Activity Tolerance Patient tolerated treatment well;Patient limited by pain    Behavior During Therapy WFL for tasks assessed/performed;Impulsive               Past Medical History:  Diagnosis Date   Engages in vaping    H/O ventral hernia    S/p of repair   Hypertension Early 2010's   Marijuana use    Orthostatic dizziness 10/08/2022   Past Surgical History:  Procedure Laterality Date   HIP ARTHROPLASTY Left 10/07/2022   Procedure: ARTHROPLASTY BIPOLAR HIP (HEMIARTHROPLASTY);  Surgeon: Juanell Fairly, MD;  Location: [facility] ORS;  Service: Orthopedics;  Laterality: Left;   JOINT REPLACEMENT  10/06/2022   Left Hip Replacement due fall while at work   Ventral hernia repair     Patient Active Problem List   Diagnosis Date Noted   Asymptomatic hypertensive urgency 04/05/2023   Osteopenia determined by x-ray 02/12/2023   Hyperlipidemia 01/10/2023   Prediabetes 01/10/2023   Primary hypertension 01/10/2023   Left ventricular hypertrophy 01/10/2023   Abnormal EKG 10/06/2022   Bone lesion_sclerotic lesions in the right iliac bone 10/06/2022    PCP: Dewaine Oats MD  REFERRING PROVIDER: Juanell Fairly MD   REFERRING DIAG: [phone] (ICD-10-CM) - Presence of left artificial hip joint   THERAPY DIAG:  History of left hip hemiarthroplasty  Pain in left hip  Weakness of left leg  Balance problems  Difficulty in walking, not elsewhere classified  Rationale for Evaluation and Treatment Rehabilitation  PERTINENT HISTORY: From initial eval: Christian Hughes is a 62 y.o. male without significant past medical  history except for occasional vaping amd marijuana use, who presents with fall and left hip pain.  Pt states that he slipped and fell at work at about 11:00 AM.  Denies loss of consciousness.  He injured his left hip, causing left hip pain, which is constant, sharp, severe, nonradiating, aggravated by movement.  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    "My mom took care of me after my hip surgery. I had therapy at home. I returned to my home alone after my first follow up appointment. I have pain in my buttocks, groin and the corner of my buttocks of about 2 to 3/10. I have high pain tolerance. I am unable to walk normal and I feel weak. I began to use this cane on my own after my PT at home stopped.  I can't wait to go to work because, I love to work."   PAIN:  Are you having pain? Yes: Pain location: 2-3/10 Pain description: stabbing Aggravating factors: Moving, walking, WB   Relieving factors: rest, OTC meds, ice    WEIGHT BEARING RESTRICTIONS: Yes:  WBAT   FALLS:  Has patient fallen in last 6 months? Yes. 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 Pt negotiates some small staircases in town. Pt has historically taken 24-packs of water bottles to his work for customers.    PLOF: Independent   PATIENT GOALS: "I want to return to work without hurting and falling as soon as I can."   PRECAUTIONS: Posterior hip precautions      OBJECTIVE: (objective measures completed at initial evaluation unless otherwise  dated)  PALPATION: Pain in L sacral border, Iliac crest, gluteus medius, TFL, and greater trochanter     LOWER EXTREMITY ROM:    Active ROM Right eval Left eval  Hip flexion WFL 20 deg with pain  Hip extension [facility]    Hip abduction WFL 20 with pain   Hip adduction [facility]    Hip internal rotation [facility]    Hip external rotation [facility]    Knee flexion [facility]  Knee extension [facility]  Ankle dorsiflexion [facility]  Ankle plantarflexion [facility]  Ankle inversion [facility]  Ankle eversion WFL WFL   (Blank rows = not tested)   LOWER EXTREMITY MMT:   MMT Right eval Left eval Left 12/04/22 Left 12/18/22 Left 01/29/23 Left 03/15/23 Left 04/11/23 Left 04/30/23  Hip flexion WFL 2+ 4* 4- 4 4* 4* 4+  Hip extension WFL 2 3+  3+* 3+* 4 3+*  Hip abduction WFl 2 4-  3+* 4* 4* 3+*  Hip adduction WFl          Hip internal rotation [facility]          Hip external rotation [facility]          Knee flexion WFL 3- 3+ 4- 4 4+* 5 5  Knee extension WFl 3 4+ 4* 4* 4-* 4+ 5  Ankle dorsiflexion WFL 3+        Ankle plantarflexion WFL 4        Ankle inversion WFL 4        Ankle eversion WFL 3-         (Blank rows = not tested) Pt's L ankle remains inverted 10 degrees since the Surgery.     FUNCTIONAL TESTS (INITIAL EVALUATION) 30 seconds chair stand test: 5 reps completed Timed up and go (TUG): 19secs 10 meter walk test: 12secs= 1,2 m/secs             Balance Test : SL standing unable, Modified tandem 10 secs, Tandem Unable, Rhomberg with EO 5 secs and unable with EC.  GAIT: Distance walked: 30 ft  Assistive device utilized: Single point cane Level of assistance: SBA Comments: Pt unsteady on feet with severe antalgic gait, decreased loading response, absent heel strike and absent toe off gait with uneven stride and decreased speed. Heavy reliance on the [facility]   -----  03/27/23  Posture: Lumbar lordosis: WNL Iliac crest height: Equal bilaterally Lumbar lateral shift: Mild shift to R to offload LLE  AROM AROM (Normal  range in degrees) AROM   Lumbar   Flexion (65) 75% (stopped at 90 deg hip flexion)  Extension (30) 50%  Right lateral flexion (25) 100%  Left lateral flexion (25) 100%  Right rotation (30) 75%  Left rotation (30) 75%      (* = pain; Blank rows = not tested)   Sensation  Grossly intact to light touch throughout bilateral LEs as determined by testing dermatomes L2-S2. Proprioception, stereognosis, and hot/cold testing deferred on this date.   Special Tests Lumbar Radiculopathy and Discogenic: Centralization and Peripheralization (SN 92, -LR 0.12): Not examined Slump (SN 83, -LR 0.32): R: Negative L: Negative SLR (SN 92, -LR 0.29): R: Negative L:  Negative  Facet Joint: Extension-Rotation (SN 100, -LR 0.0): R: Negative L: Positive  Lumbar Foraminal Stenosis: Lumbar quadrant (SN 70): R: Negative L: Positive      TODAY'S TREATMENT:                                                                                                                              DATE: 05/14/2023    L hip hemiarthroplasty following L femoral neck fracture (DOS: 10/07/22)   SUBJECTIVE STATEMENT:  Pt reports more pain last time versus today; he reports being back to his baseline level of pain today. He reports 2.5-3/10 discomfort/pressure along medial groin and anterior thigh proximally. Patient acquired TENS unit after last visit and he has questions today regarding setup.    Today's Vitals   05/14/23 1515  BP: (!) 152/99  Pulse: 69    *Blood pressure following exercise on table: 133/88 mmHg   There is no height or weight on file to calculate BMI.     Therapeutic Exercise - for improved soft tissue flexibility and extensibility as needed for ROM, improved strength as needed to improve performance of CKC activities/functional movements   Nu-Step L3. Seat at 13 to maintain posterior hip flexion precautions. 5 minutes, for warm up  Glute bridge with Green physioball under calves;  3x10  Manual prone quad stretch; 2 x 30 sec  Prone quad stretch with bedsheet looped around ankle; x 30 sec  -for HEP demo  -pt has hamstring cramp when attempted active prone knee flexion hampering initiating of prone quad stretch  Butt kicker along blue agility ladder; 2x D/B length of ladder for active quad/hip flexor mobility work and weight shifting  Side steps along blue agility ladder: x2 D/B with Blue Tband looped above patellae   *next visit* Lateral step up to 6-inch step; staircase in center of gym; 1x12  -pt has limited tolerance of CKC loading with vaulting off of LE on floor and lateral flexion to L when weight shifting to LLE    PATIENT EDUCATION: Reviewed setup and use of TENS unit at home as well as parameters.     *not today* Cold pack (unbilled) - for anti-inflammatory and analgesic effect during e-stim as needed for reduced pain and improved ability to participate in active PT intervention, along L anterior in supine, x 10 minutes Alternating arms and legs with abdominal brace with Green physioball; x20 alternating R/L Forward step-up to opposite LE tap on next step; 2x10, 6-inch step (staircase center of clinic) Alternating hip flexion with Tband, in standing today; 2x8 alternating R/L with Red Tband Forward step over (3) 6-inch  hurdles and (2) 12-inch hurdles, unsupported in open gym environment; 3x D/B with reciprocal patternProne hip extension, alternating; 2x10 alt; 2-lb ankle weights Butterfly stretch, technique reviewed for HEP Modified Thomas hip flexor stretch, technique reviewed for HEP Forward step up to Airex pad; 1x15 surgical LE leading  Sidelying hip abduction, no weight; 2x8 Prone knee bend, dynamic stretch; 1x10, 1 sec; for review Toe tapping; 2x10 alternating R/L with 12-inch step, no UE support; stance on Airex padThomas hip flexor stretch; reviewed Butterfly stretch in supine; reviewed Manual hip flexor stretch in R sidelying, gentle (hip to  neutral); 2 x 30 sec Standing march with 5-lb ankle weights; 2 x 10 alternating, holding treadmill armrest Sit to stand: 1x6 and 1x4; from chair with 8-lb Goblet hold  Unipedal stance; 2 x 15 sec Bridge 3x12:  Black Tband at distal femurs with good carryover in form/technique.  Butterfly stretch; 3 x 20 sec        PATIENT EDUCATION:  Education details: see above for patient education details Person educated: Patient Education method: Explanation and Demonstration Education comprehension: verbalized understanding   HOME EXERCISE PROGRAM: Access Code: [email or web address] URL: [email or web address] Date: 04/18/2023 Prepared by: Consuela Mimes  Exercises - Sit to Stand  - 1 x daily - 7 x weekly - 2 sets - 10 reps - Supine Bridge with Resistance Band  - 1 x daily - 7 x weekly - 2 sets - 10 reps - Sidelying Hip Abduction  - 1 x daily - 7 x weekly - 2 sets - 10 reps - Prone Hip Extension  - 1 x daily - 7 x weekly - 2 sets - 10 reps - Supine Hip Flexion with Resistance Loop  - 1 x daily - 7 x weekly - 2 sets - 10 reps - Prone Femoral Nerve Mobilization  - 2 x daily - 7 x weekly - 2 sets - 10 reps - 1sec hold     ASSESSMENT:   CLINICAL IMPRESSION:  Patient has baseline level of pain today with feeling of pressure or something "sticking into" R hip flexor/medial groin region in particular. Patient reports minimal symptoms in non-weightbearing. Pt has notable taut/tender rectus femoris and vastus lateralis as well as TFL. We updated his program to include quadriceps stretching; pt has significant mobility loss per Ely's test with less than 90 deg knee flexion in prone with reported pull along hip flexor/anterior thigh. Pt has made notable progress compared to significant limitations at outset, but he still has prolonged POC and persisting groin pain longer than expected given 7+ months post-op status. Pt has remaining deficits in hip flexor and adductor flexibility/extensibility, hip  ABD/EXT strength, weightbearing tolerance, gait deviations, and limitations with negotiating steps/curbs/uneven terrain. Patient will benefit from continued skilled therapeutic intervention to address the remaining deficits in hip strength, mobility, and weightbearing tolerance as needed for improved function and ability to perform work duties.      OBJECTIVE IMPAIRMENTS: Abnormal gait, decreased activity tolerance, decreased balance, decreased endurance, decreased knowledge of condition, decreased knowledge of use of DME, decreased mobility, difficulty walking, decreased ROM, decreased strength, and dizziness.    ACTIVITY LIMITATIONS: carrying, lifting, bending, sitting, standing, squatting, sleeping, stairs, transfers, dressing, and locomotion level   PARTICIPATION LIMITATIONS: cleaning, shopping, community activity, occupation, and yard work   PERSONAL FACTORS:  Pt is impulsive and eager to get better so that he can return to work.   are also affecting patient's functional outcome.    REHAB POTENTIAL: Good   CLINICAL DECISION MAKING:  Stable/uncomplicated   EVALUATION COMPLEXITY: Low     GOALS: Goals reviewed with patient? Yes   SHORT TERM GOALS: Target date: 12/22/2022 Pt will improve L hip ROM to 10 to 90 degrees without pain in order to promote normal gait and stability.  Baseline: 20 degree Hip felx and 0 deg Ext 12/04/22: 90 deg hip flex and 5 deg EXT 12/18/22: 90 deg hip flex and 10 deg hip EXT Goal status: ACHIEVED    2.  Pt will be able to ambulate 56ft safely with Cane without fall and pain . Baseline: 30 ft with SBA and heavy reliance on [facility].   12/04/22: Pt uses SPC for primary means of mobility in home and community at this time.  Goal status: ACHIEVED       LONG TERM GOALS: Target date: 01/26/2023   Pt will Improve TUG time to <10secs without SPC to demonstrate decreased fall risk.  Baseline: 19 secs with SPC 12/04/22: 14.6 seconds with SPC 12/18/22: 9.5 sec Goal  status: ACHIEVED   2.  Pt will Improve LEFS score to >65/80 to demonstrate improved QoL Baseline: 18/80 12/04/22: 20/80  12/18/22: 22/80 01/29/23: 25/80  03/15/23: 24/80 04/09/23: 27/80  04/30/23: 26/80 Goal status: ON-GOING   3.  Pt will be able to ambulate Independently  with LRAD without LOB and pain in order to return to community level mobility safely  Baseline: With [facility] and FWW.   12/04/22: ModI ambulation with SPC/LRAD with mild pain at longer distances, no LOB 12/18/22: Amb IND with SPC with no LOB Goal status: ACHIEVED    4.  Pt will score >50 on FOTO Baseline: 36/56 12/04/22: 35/56 12/18/22: 37/56 01/29/23: 43/56 03/15/23: 38/56 04/09/23: 43/56 04/30/23: 41/56 Goal status: IN PROGRESS   5.  Pt will demonstrate 4/5 L hip strength without pain to improve stability and safety with all functional mobility in order to return to work.  Baseline: 2/5 grossly    12/04/22: 4-/5 grossly 12/18/22: 4-/5 grossly 01/29/23: 4/5 for hip flexors/quads/HS, 3+/5 for hip ABD and EXT.  03/15/23: Remaining weakness in hip flexors, quads, abductors, extensors.  04/09/23: Improved strength with pt up to at least 4/5, pain with loading hip flexors and abductors  04/30/23: Weak hip abduction and extension MMT with heightened pain; good strength otherwise Goal status: IN PROGRESS         PLAN:   PT FREQUENCY: 2x/week   PT DURATION: 4 weeks   PLANNED INTERVENTIONS: Therapeutic exercises, Therapeutic activity, Neuromuscular re-education, Balance training, Gait training, Patient/Family education, Self Care, and Joint mobilization   PLAN FOR NEXT SESSION: Hip extensor and abductor strengthening with integration of trunk stabilization work, adductor and hip flexor stretching, progressive weightbearing activity, graded introduction of closed-chain loading. Modalities as needed for pain control.     Consuela Mimes, PT, DPT [phone] Physical Therapist- [facility]  05/14/2023, 3:23  PM

## 2023-05-16 ENCOUNTER — Ambulatory Visit: Payer: Worker's Compensation | Admitting: Physical Therapy

## 2023-05-16 ENCOUNTER — Encounter: Payer: Self-pay | Admitting: Physical Therapy

## 2023-05-16 VITALS — BP 130/90

## 2023-05-16 DIAGNOSIS — R2689 Other abnormalities of gait and mobility: Secondary | ICD-10-CM

## 2023-05-16 DIAGNOSIS — R29898 Other symptoms and signs involving the musculoskeletal system: Secondary | ICD-10-CM

## 2023-05-16 DIAGNOSIS — M25552 Pain in left hip: Secondary | ICD-10-CM

## 2023-05-16 DIAGNOSIS — R262 Difficulty in walking, not elsewhere classified: Secondary | ICD-10-CM

## 2023-05-16 DIAGNOSIS — Z96642 Presence of left artificial hip joint: Secondary | ICD-10-CM

## 2023-05-16 NOTE — Therapy (Unsigned)
OUTPATIENT PHYSICAL THERAPY TREATMENT AND PROGRESS NOTE   Dates of reporting period  04/30/23   to   05/16/23   Patient Name: Christian Hughes MRN: [id number] DOB:September 21, 1960, 62 y.o., male Today's Date: 05/16/2023   END OF SESSION:   PT End of Session - 05/16/23 1508     Visit Number 40    Number of Visits 43    Authorization Type Workers Compensation    PT Start Time 1502    PT Stop Time 1545    PT Time Calculation (min) 43 min    Activity Tolerance Patient tolerated treatment well;Patient limited by pain    Behavior During Therapy WFL for tasks assessed/performed;Impulsive             Past Medical History:  Diagnosis Date   Engages in vaping    H/O ventral hernia    S/p of repair   Hypertension Early 2010's   Marijuana use    Orthostatic dizziness 10/08/2022   Past Surgical History:  Procedure Laterality Date   HIP ARTHROPLASTY Left 10/07/2022   Procedure: ARTHROPLASTY BIPOLAR HIP (HEMIARTHROPLASTY);  Surgeon: Juanell Fairly, MD;  Location: [facility] ORS;  Service: Orthopedics;  Laterality: Left;   JOINT REPLACEMENT  10/06/2022   Left Hip Replacement due fall while at work   Ventral hernia repair     Patient Active Problem List   Diagnosis Date Noted   Asymptomatic hypertensive urgency 04/05/2023   Osteopenia determined by x-ray 02/12/2023   Hyperlipidemia 01/10/2023   Prediabetes 01/10/2023   Primary hypertension 01/10/2023   Left ventricular hypertrophy 01/10/2023   Abnormal EKG 10/06/2022   Bone lesion_sclerotic lesions in the right iliac bone 10/06/2022    PCP: Dewaine Oats MD  REFERRING PROVIDER: Juanell Fairly MD   REFERRING DIAG: [phone] (ICD-10-CM) - Presence of left artificial hip joint   THERAPY DIAG:  History of left hip hemiarthroplasty  Pain in left hip  Weakness of left leg  Balance problems  Difficulty in walking, not elsewhere classified  Rationale for Evaluation and Treatment Rehabilitation  PERTINENT HISTORY: From initial eval:  Christian Hughes is a 62 y.o. male without significant past medical history except for occasional vaping amd marijuana use, who presents with fall and left hip pain.  Pt states that he slipped and fell at work at about 11:00 AM.  Denies loss of consciousness.  He injured his left hip, causing left hip pain, which is constant, sharp, severe, nonradiating, aggravated by movement.  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    "My mom took care of me after my hip surgery. I had therapy at home. I returned to my home alone after my first follow up appointment. I have pain in my buttocks, groin and the corner of my buttocks of about 2 to 3/10. I have high pain tolerance. I am unable to walk normal and I feel weak. I began to use this cane on my own after my PT at home stopped.  I can't wait to go to work because, I love to work."   PAIN:  Are you having pain? Yes: Pain location: 2-3/10 Pain description: stabbing Aggravating factors: Moving, walking, WB   Relieving factors: rest, OTC meds, ice    WEIGHT BEARING RESTRICTIONS: Yes:  WBAT   FALLS:  Has patient fallen in last 6 months? Yes. 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 Pt negotiates some small staircases in town. Pt has historically taken 24-packs of water bottles to his work for customers.    PLOF: Independent   PATIENT GOALS: "I want to return to work without hurting and falling as soon as I can."   PRECAUTIONS: Posterior hip precautions      OBJECTIVE: (objective  measures completed at initial evaluation unless otherwise dated)  PALPATION: Pain in L sacral border, Iliac crest, gluteus medius, TFL, and greater trochanter     LOWER EXTREMITY ROM:    Active ROM Right eval Left eval  Hip flexion WFL 20 deg with pain  Hip extension [facility]    Hip abduction WFL 20 with pain   Hip adduction [facility]    Hip internal rotation [facility]    Hip external rotation [facility]    Knee flexion [facility]  Knee extension [facility]  Ankle dorsiflexion [facility]  Ankle plantarflexion [facility]  Ankle inversion [facility]  Ankle eversion WFL WFL   (Blank rows = not tested)   LOWER EXTREMITY MMT:   MMT Right eval Left eval Left 12/04/22 Left 12/18/22 Left 01/29/23 Left 03/15/23 Left 04/11/23 Left 04/30/23 Left 05/16/23  Hip flexion WFL 2+ 4* 4- 4 4* 4* 4+ 4*  Hip extension WFL 2 3+  3+* 3+* 4 3+* 4-*  Hip abduction WFl 2 4-  3+* 4* 4* 3+* 3+*  Hip adduction WFl           Hip internal rotation [facility]           Hip external rotation [facility]           Knee flexion WFL 3- 3+ 4- 4 4+* 5 5 4+  Knee extension WFl 3 4+ 4* 4* 4-* 4+ 5 4*  Ankle dorsiflexion WFL 3+         Ankle plantarflexion WFL 4         Ankle inversion WFL 4         Ankle eversion WFL 3-          (Blank rows = not tested) Pt's L ankle remains inverted 10 degrees since the Surgery.     FUNCTIONAL TESTS (INITIAL EVALUATION) 30 seconds chair stand test: 5 reps completed Timed up and go (TUG): 19secs 10 meter walk test: 12secs= 1,2 m/secs             Balance Test : SL standing unable, Modified tandem 10 secs, Tandem Unable, Rhomberg with EO 5 secs and unable with EC.  GAIT: Distance walked: 30 ft  Assistive device utilized: Single point cane Level of assistance: SBA Comments: Pt unsteady on feet with severe antalgic gait, decreased loading response, absent heel strike and absent toe off gait with uneven stride and decreased speed. Heavy reliance on the [facility]   -----  03/27/23  Posture: Lumbar lordosis: WNL Iliac crest  height: Equal bilaterally Lumbar lateral shift: Mild shift to R to offload LLE  AROM AROM (Normal range in degrees) AROM   Lumbar   Flexion (65) 75% (stopped at 90 deg hip flexion)  Extension (30) 50%  Right lateral flexion (25) 100%  Left  lateral flexion (25) 100%  Right rotation (30) 75%  Left rotation (30) 75%      (* = pain; Blank rows = not tested)   Sensation Grossly intact to light touch throughout bilateral LEs as determined by testing dermatomes L2-S2. Proprioception, stereognosis, and hot/cold testing deferred on this date.   Special Tests Lumbar Radiculopathy and Discogenic: Centralization and Peripheralization (SN 92, -LR 0.12): Not examined Slump (SN 83, -LR 0.32): R: Negative L: Negative SLR (SN 92, -LR 0.29): R: Negative L:  Negative  Facet Joint: Extension-Rotation (SN 100, -LR 0.0): R: Negative L: Positive  Lumbar Foraminal Stenosis: Lumbar quadrant (SN 70): R: Negative L: Positive      TODAY'S TREATMENT:                                                                                                                              DATE: 05/16/2023    L hip hemiarthroplasty following L femoral neck fracture (DOS: 10/07/22)   SUBJECTIVE STATEMENT:  Pt reports having notable pain today with cold/cloudy weather. Patient reports 4/10 pain at arrival to PT. Patient reports some soreness after last visit. Patient reports doing okay after rest period and using heating pad. He reports some cramping at night intermittently affecting L thigh. Patient reports completing his HEP. Pt is donning TENS unit for pain management prn. Patient reports that outpatient rehab has exhausted all efforts to improve condition and rehab his L hip. He reports he is limited sometimes with activity and exercise performance due to L hip bothering him. Pt reports he is doing all he can do working 9 AM - 1 PM presently. 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 He  travels to bank and [place] often for his job. 50% global rating of function at this time.    Today's Vitals   05/16/23 1511  BP: (!) 130/90     There is no height or weight on file to calculate BMI.     Therapeutic Exercise - for improved soft tissue flexibility and extensibility as needed for ROM, improved strength as needed to improve performance of CKC activities/functional movements  Nu-Step L2. Seat at 13 to maintain posterior hip flexion precautions. 5 minutes, for warm up   *GOAL UPDATE PERFORMED   Glute bridge with Green physioball under calves; 3x10  Manual prone quad stretch; 2 x 30 sec  Prone quad stretch with bedsheet looped around ankle; x 30 sec  -for HEP demo  -pt has hamstring cramp when attempted active prone knee flexion hampering initiating of prone quad stretch  Butt kicker along blue agility ladder; 2x D/B length of ladder for active quad/hip flexor mobility work and weight shifting  Side steps along blue agility ladder: x2 D/B with Blue Tband looped above patellae   *next visit* Lateral step up to 6-inch step; staircase in center of gym; 1x12  -pt has limited tolerance of CKC loading with vaulting off of  LE on floor and lateral flexion to L when weight shifting to LLE    PATIENT EDUCATION: Reviewed setup and use of TENS unit at home as well as parameters.     *not today* Cold pack (unbilled) - for anti-inflammatory and analgesic effect during e-stim as needed for reduced pain and improved ability to participate in active PT intervention, along L anterior in supine, x 10 minutes Alternating arms and legs with abdominal brace with Green physioball; x20 alternating R/L Forward step-up to opposite LE tap on next step; 2x10, 6-inch step (staircase center of clinic) Alternating hip flexion with Tband, in standing today; 2x8 alternating R/L with Red Tband Forward step over (3) 6-inch hurdles and (2) 12-inch hurdles, unsupported in open gym environment;  3x D/B with reciprocal patternProne hip extension, alternating; 2x10 alt; 2-lb ankle weights Butterfly stretch, technique reviewed for HEP Modified Thomas hip flexor stretch, technique reviewed for HEP Forward step up to Airex pad; 1x15 surgical LE leading  Sidelying hip abduction, no weight; 2x8 Prone knee bend, dynamic stretch; 1x10, 1 sec; for review Toe tapping; 2x10 alternating R/L with 12-inch step, no UE support; stance on Airex padThomas hip flexor stretch; reviewed Butterfly stretch in supine; reviewed Manual hip flexor stretch in R sidelying, gentle (hip to neutral); 2 x 30 sec Standing march with 5-lb ankle weights; 2 x 10 alternating, holding treadmill armrest Sit to stand: 1x6 and 1x4; from chair with 8-lb Goblet hold  Unipedal stance; 2 x 15 sec Bridge 3x12:  Black Tband at distal femurs with good carryover in form/technique.  Butterfly stretch; 3 x 20 sec        PATIENT EDUCATION:  Education details: see above for patient education details Person educated: Patient Education method: Explanation and Demonstration Education comprehension: verbalized understanding   HOME EXERCISE PROGRAM: Access Code: [email or web address] URL: [email or web address] Date: 04/18/2023 Prepared by: Consuela Mimes  Exercises - Sit to Stand  - 1 x daily - 7 x weekly - 2 sets - 10 reps - Supine Bridge with Resistance Band  - 1 x daily - 7 x weekly - 2 sets - 10 reps - Sidelying Hip Abduction  - 1 x daily - 7 x weekly - 2 sets - 10 reps - Prone Hip Extension  - 1 x daily - 7 x weekly - 2 sets - 10 reps - Supine Hip Flexion with Resistance Loop  - 1 x daily - 7 x weekly - 2 sets - 10 reps - Prone Femoral Nerve Mobilization  - 2 x daily - 7 x weekly - 2 sets - 10 reps - 1sec hold     ASSESSMENT:   CLINICAL IMPRESSION:  Patient has baseline level of pain today with feeling of pressure or something "sticking into" R hip flexor/medial groin region in particular. Patient reports minimal  symptoms in non-weightbearing. Pt has notable taut/tender rectus femoris and vastus lateralis as well as TFL. We updated his program to include quadriceps stretching; pt has significant mobility loss per Ely's test with less than 90 deg knee flexion in prone with reported pull along hip flexor/anterior thigh. Pt has made notable progress compared to significant limitations at outset, but he still has prolonged POC and persisting groin pain longer than expected given 7+ months post-op status. Pt has remaining deficits in hip flexor and adductor flexibility/extensibility, hip ABD/EXT strength, weightbearing tolerance, gait deviations, and limitations with negotiating steps/curbs/uneven terrain. Patient will benefit from continued skilled therapeutic intervention to address the remaining deficits in hip strength, mobility, and  weightbearing tolerance as needed for improved function and ability to perform work duties.      OBJECTIVE IMPAIRMENTS: Abnormal gait, decreased activity tolerance, decreased balance, decreased endurance, decreased knowledge of condition, decreased knowledge of use of DME, decreased mobility, difficulty walking, decreased ROM, decreased strength, and dizziness.    ACTIVITY LIMITATIONS: carrying, lifting, bending, sitting, standing, squatting, sleeping, stairs, transfers, dressing, and locomotion level   PARTICIPATION LIMITATIONS: cleaning, shopping, community activity, occupation, and yard work   PERSONAL FACTORS:  Pt is impulsive and eager to get better so that he can return to work.   are also affecting patient's functional outcome.    REHAB POTENTIAL: Good   CLINICAL DECISION MAKING: Stable/uncomplicated   EVALUATION COMPLEXITY: Low     GOALS: Goals reviewed with patient? Yes   SHORT TERM GOALS: Target date: 12/22/2022 Pt will improve L hip ROM to 10 to 90 degrees without pain in order to promote normal gait and stability.  Baseline: 20 degree Hip felx and 0 deg  Ext 12/04/22: 90 deg hip flex and 5 deg EXT 12/18/22: 90 deg hip flex and 10 deg hip EXT Goal status: ACHIEVED    2.  Pt will be able to ambulate 562ft safely with Cane without fall and pain . Baseline: 30 ft with SBA and heavy reliance on [facility].   12/04/22: Pt uses SPC for primary means of mobility in home and community at this time.  Goal status: ACHIEVED       LONG TERM GOALS: Target date: 01/26/2023   Pt will Improve TUG time to <10secs without SPC to demonstrate decreased fall risk.  Baseline: 19 secs with SPC 12/04/22: 14.6 seconds with SPC 12/18/22: 9.5 sec Goal status: ACHIEVED   2.  Pt will Improve LEFS score to >65/80 to demonstrate improved QoL Baseline: 18/80 12/04/22: 20/80  12/18/22: 22/80 01/29/23: 25/80  03/15/23: 24/80 04/09/23: 27/80  04/30/23: 26/80 05/16/23:  19/80 Goal status: NOT MET   3.  Pt will be able to ambulate Independently  with LRAD without LOB and pain in order to return to community level mobility safely  Baseline: With [facility] and [facility].   12/04/22: ModI ambulation with SPC/LRAD with mild pain at longer distances, no LOB 12/18/22: Amb IND with SPC with no LOB Goal status: ACHIEVED    4.  Pt will score >50 on FOTO Baseline: 36/56 12/04/22: 35/56 12/18/22: 37/56 01/29/23: 43/56 03/15/23: 38/56 04/09/23: 43/56 04/30/23: 41/56 05/16/23:  38/56 Goal status: NOT MET   5.  Pt will demonstrate 4/5 L hip strength without pain to improve stability and safety with all functional mobility in order to return to work.  Baseline: 2/5 grossly    12/04/22: 4-/5 grossly 12/18/22: 4-/5 grossly 01/29/23: 4/5 for hip flexors/quads/HS, 3+/5 for hip ABD and EXT.  03/15/23: Remaining weakness in hip flexors, quads, abductors, extensors.  04/09/23: Improved strength with pt up to at least 4/5, pain with loading hip flexors and abductors  04/30/23: Weak hip abduction and extension MMT with heightened pain; good strength otherwise 05/16/23:  Goal status: IN PROGRESS         PLAN:   PT  FREQUENCY: 2x/week   PT DURATION: 4 weeks   PLANNED INTERVENTIONS: Therapeutic exercises, Therapeutic activity, Neuromuscular re-education, Balance training, Gait training, Patient/Family education, Self Care, and Joint mobilization   PLAN FOR NEXT SESSION: Hip extensor and abductor strengthening with integration of trunk stabilization work, adductor and hip flexor stretching, progressive weightbearing activity, graded introduction of closed-chain loading. Modalities as needed for pain  control.     Consuela Mimes, PT, DPT [phone] Physical Therapist- [facility]  05/16/2023, 3:11 PM

## 2023-06-05 ENCOUNTER — Ambulatory Visit: Payer: Worker's Compensation | Attending: Orthopedic Surgery | Admitting: Physical Therapy

## 2023-06-05 ENCOUNTER — Encounter: Payer: Self-pay | Admitting: Physical Therapy

## 2023-06-05 VITALS — BP 127/94 | HR 77

## 2023-06-05 DIAGNOSIS — R29898 Other symptoms and signs involving the musculoskeletal system: Secondary | ICD-10-CM | POA: Diagnosis present

## 2023-06-05 DIAGNOSIS — M25552 Pain in left hip: Secondary | ICD-10-CM | POA: Diagnosis present

## 2023-06-05 DIAGNOSIS — Z96642 Presence of left artificial hip joint: Secondary | ICD-10-CM | POA: Insufficient documentation

## 2023-06-05 DIAGNOSIS — R2689 Other abnormalities of gait and mobility: Secondary | ICD-10-CM | POA: Diagnosis present

## 2023-06-05 DIAGNOSIS — R262 Difficulty in walking, not elsewhere classified: Secondary | ICD-10-CM | POA: Diagnosis present

## 2023-06-05 NOTE — Therapy (Unsigned)
OUTPATIENT PHYSICAL THERAPY TREATMENT  Patient Name: Christian Hughes MRN: [id number] DOB:16-Jun-1961, 62 y.o., male Today's Date: 06/05/2023   END OF SESSION:   PT End of Session - 06/05/23 1440     Visit Number 41    Number of Visits 43    Authorization Type Workers Compensation    PT Start Time 1437    PT Stop Time 1518    PT Time Calculation (min) 41 min    Activity Tolerance Patient tolerated treatment well;Patient limited by pain    Behavior During Therapy WFL for tasks assessed/performed;Impulsive             Past Medical History:  Diagnosis Date   Engages in vaping    H/O ventral hernia    S/p of repair   Hypertension Early 2010's   Marijuana use    Orthostatic dizziness 10/08/2022   Past Surgical History:  Procedure Laterality Date   HIP ARTHROPLASTY Left 10/07/2022   Procedure: ARTHROPLASTY BIPOLAR HIP (HEMIARTHROPLASTY);  Surgeon: Juanell Fairly, MD;  Location: [facility] ORS;  Service: Orthopedics;  Laterality: Left;   JOINT REPLACEMENT  10/06/2022   Left Hip Replacement due fall while at work   Ventral hernia repair     Patient Active Problem List   Diagnosis Date Noted   Asymptomatic hypertensive urgency 04/05/2023   Osteopenia determined by x-ray 02/12/2023   Hyperlipidemia 01/10/2023   Prediabetes 01/10/2023   Primary hypertension 01/10/2023   Left ventricular hypertrophy 01/10/2023   Abnormal EKG 10/06/2022   Bone lesion_sclerotic lesions in the right iliac bone 10/06/2022    PCP: Dewaine Oats MD  REFERRING PROVIDER: Juanell Fairly MD   REFERRING DIAG: [phone] (ICD-10-CM) - Presence of left artificial hip joint   THERAPY DIAG:  History of left hip hemiarthroplasty  Pain in left hip  Weakness of left leg  Balance problems  Difficulty in walking, not elsewhere classified  Rationale for Evaluation and Treatment Rehabilitation  PERTINENT HISTORY: From initial eval: Christian Hughes is a 62 y.o. male without significant past medical history  except for occasional vaping amd marijuana use, who presents with fall and left hip pain.  Pt states that he slipped and fell at work at about 11:00 AM.  Denies loss of consciousness.  He injured his left hip, causing left hip pain, which is constant, sharp, severe, nonradiating, aggravated by movement.  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    "My mom took care of me after my hip surgery. I had therapy at home. I returned to my home alone after my first follow up appointment. I have pain in my buttocks, groin and the corner of my buttocks of about 2 to 3/10. I have high pain tolerance. I am unable to walk normal and I feel weak. I began to use this cane on my own after my PT at home stopped.  I can't wait to go to work because, I love to work."   PAIN:  Are you having pain? Yes: Pain location: 2-3/10 Pain description: stabbing Aggravating factors: Moving, walking, WB   Relieving factors: rest, OTC meds, ice    WEIGHT BEARING RESTRICTIONS: Yes:  WBAT   FALLS:  Has patient fallen in last 6 months? Yes. 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 Pt negotiates some small staircases in town. Pt has historically taken 24-packs of water bottles to his work for customers.    PLOF: Independent   PATIENT GOALS: "I want to return to work without hurting and falling as soon as I can."   PRECAUTIONS: Posterior hip precautions      OBJECTIVE: (objective measures completed at initial evaluation unless otherwise  dated)  PALPATION: Pain in L sacral border, Iliac crest, gluteus medius, TFL, and greater trochanter     LOWER EXTREMITY ROM:    Active ROM Right eval Left eval  Hip flexion WFL 20 deg with pain  Hip extension [facility]    Hip abduction WFL 20 with pain   Hip adduction [facility]    Hip internal rotation [facility]    Hip external rotation [facility]    Knee flexion [facility]  Knee extension [facility]  Ankle dorsiflexion [facility]  Ankle plantarflexion [facility]  Ankle inversion [facility]  Ankle eversion WFL WFL   (Blank rows = not tested)   LOWER EXTREMITY MMT:   MMT Right eval Left eval Left 12/04/22 Left 12/18/22 Left 01/29/23 Left 03/15/23 Left 04/11/23 Left 04/30/23 Left 05/16/23  Hip flexion WFL 2+ 4* 4- 4 4* 4* 4+ 4*  Hip extension WFL 2 3+  3+* 3+* 4 3+* 4-*  Hip abduction WFl 2 4-  3+* 4* 4* 3+* 3+*  Hip adduction WFl           Hip internal rotation [facility]           Hip external rotation [facility]           Knee flexion WFL 3- 3+ 4- 4 4+* 5 5 4+  Knee extension WFl 3 4+ 4* 4* 4-* 4+ 5 4*  Ankle dorsiflexion WFL 3+         Ankle plantarflexion WFL 4         Ankle inversion WFL 4         Ankle eversion WFL 3-          (Blank rows = not tested) Pt's L ankle remains inverted 10 degrees since the Surgery.     FUNCTIONAL TESTS (INITIAL EVALUATION) 30 seconds chair stand test: 5 reps completed Timed up and go (TUG): 19secs 10 meter walk test: 12secs= 1,2 m/secs             Balance Test : SL standing unable, Modified tandem 10 secs, Tandem Unable, Rhomberg with EO 5 secs and unable with EC.  GAIT: Distance walked: 30 ft  Assistive device utilized: Single point cane Level of assistance: SBA Comments: Pt unsteady on feet with severe antalgic gait, decreased loading response, absent heel strike and absent toe off gait with uneven stride and decreased speed. Heavy reliance on the [facility]   -----  03/27/23  Posture: Lumbar lordosis: WNL Iliac crest height: Equal bilaterally Lumbar lateral shift: Mild shift to  R to offload LLE  AROM AROM (Normal range in degrees) AROM   Lumbar   Flexion (65) 75% (stopped at 90 deg hip flexion)  Extension (30) 50%  Right lateral flexion (25) 100%  Left lateral flexion (25) 100%  Right rotation (30) 75%  Left rotation (30) 75%      (* =  pain; Blank rows = not tested)   Sensation Grossly intact to light touch throughout bilateral LEs as determined by testing dermatomes L2-S2. Proprioception, stereognosis, and hot/cold testing deferred on this date.   Special Tests Lumbar Radiculopathy and Discogenic: Centralization and Peripheralization (SN 92, -LR 0.12): Not examined Slump (SN 83, -LR 0.32): R: Negative L: Negative SLR (SN 92, -LR 0.29): R: Negative L:  Negative  Facet Joint: Extension-Rotation (SN 100, -LR 0.0): R: Negative L: Positive  Lumbar Foraminal Stenosis: Lumbar quadrant (SN 70): R: Negative L: Positive      TODAY'S TREATMENT:                                                                                                                              DATE: 06/05/2023    L hip hemiarthroplasty following L femoral neck fracture (DOS: 10/07/22)   SUBJECTIVE STATEMENT:  Pt reports 2-3/10 pain described as pinching affecting L lower limb. Pt reports more discomfort with cold weather. Pt reports some pain around groin described as "pinching" and pain along L posterolateral hip intermittently. Pt states that his physician was concerned with foot slapping during gait.     Today's Vitals   06/05/23 1441 06/05/23 1442  BP: (!) 145/103 (!) 127/94  Pulse: 79 77      There is no height or weight on file to calculate BMI.   L hip PROM   Therapeutic Exercise - for improved soft tissue flexibility and extensibility as needed for ROM, improved strength as needed to improve performance of CKC activities/functional movements  Nu-Step L3. Seat at 12 to maintain posterior hip flexion precautions. 5 minutes, for warm up  Glute bridge with  single-leg knee extension, alternating;  1x5 and 1x4 on each LE   Sidelying hip abduction, no weight; 2x8      Cold pack (unbilled) - for anti-inflammatory and analgesic effect during e-stim as needed for reduced pain and improved ability to participate in active PT intervention, along L anterior in supine, x 8 minutes     PATIENT EDUCATION: We discussed current progress with PT, limited change over last 2 goal updates and difficulty moving forward with expected post-operative progression following L hemiarthroplasty. We discussed current plan for f/u with MD next Tuesday to discuss current condition and medical management POC with work comp.     *not today* Lateral step up to 6-inch step; staircase in center of gym; 1x12  -pt has limited tolerance of CKC loading with vaulting off of LE on floor and lateral flexion to L when weight shifting to LLE Glute bridge with Green physioball under calves; 3x10 Manual prone quad stretch; 2 x 30 sec Prone quad stretch with bedsheet looped around ankle; x 30 sec  -for HEP demo  -pt has hamstring cramp when attempted gactive prone knee flexion hampering initiating of prone quad stretch Butt kicker along blue agility ladder; 2x D/B length of ladder for active quad/hip flexor mobility work and weight  shifting Side steps along blue agility ladder: x2 D/B with Blue Tband looped above patellae Alternating arms and legs with abdominal brace with Green physioball; x20 alternating R/L Forward step-up to opposite LE tap on next step; 2x10, 6-inch step (staircase center of clinic) Alternating hip flexion with Tband, in standing today; 2x8 alternating R/L with Red Tband Forward step over (3) 6-inch hurdles and (2) 12-inch hurdles, unsupported in open gym environment; 3x D/B with reciprocal patternProne hip extension, alternating; 2x10 alt; 2-lb ankle weights Butterfly stretch, technique reviewed for HEP Modified Thomas hip flexor stretch, technique reviewed  for HEP Forward step up to Airex pad; 1x15 surgical LE leading  Prone knee bend, dynamic stretch; 1x10, 1 sec; for review Toe tapping; 2x10 alternating R/L with 12-inch step, no UE support; stance on Airex padThomas hip flexor stretch; reviewed Butterfly stretch in supine; reviewed Manual hip flexor stretch in R sidelying, gentle (hip to neutral); 2 x 30 sec Standing march with 5-lb ankle weights; 2 x 10 alternating, holding treadmill armrest Sit to stand: 1x6 and 1x4; from chair with 8-lb Goblet hold  Unipedal stance; 2 x 15 sec Bridge 3x12:  Black Tband at distal femurs with good carryover in form/technique.  Butterfly stretch; 3 x 20 sec        PATIENT EDUCATION:  Education details: see above for patient education details Person educated: Patient Education method: Explanation and Demonstration Education comprehension: verbalized understanding   HOME EXERCISE PROGRAM: Access Code: [email or web address] URL: [email or web address] Date: 04/18/2023 Prepared by: Consuela Mimes  Exercises - Sit to Stand  - 1 x daily - 7 x weekly - 2 sets - 10 reps - Supine Bridge with Resistance Band  - 1 x daily - 7 x weekly - 2 sets - 10 reps - Sidelying Hip Abduction  - 1 x daily - 7 x weekly - 2 sets - 10 reps - Prone Hip Extension  - 1 x daily - 7 x weekly - 2 sets - 10 reps - Supine Hip Flexion with Resistance Loop  - 1 x daily - 7 x weekly - 2 sets - 10 reps - Prone Femoral Nerve Mobilization  - 2 x daily - 7 x weekly - 2 sets - 10 reps - 1sec hold     ASSESSMENT:   CLINICAL IMPRESSION:  Patient unfortunately has struggled with progression of activity in clinic and we have had to focus on symptom modulation and strategies for pain control with significant time removed from patient's surgery. He is currently 7.5 months post-op and has intermittent flare-ups of groin and anterior thigh pain in spite of interventions to date including manual therapy, cold pack, neurodynamics, specific  stretching, e-stim, progressive exercise. Pt does not tolerate manual therapy well and has notable hyperalgesia along hip flexor and proximal quadriceps region. LEFS and FOTO outcome surveys are not significantly changed from baseline. Pt has fortunately weaned down from walker and seldom uses cane for gait at this time. He has met short-term ambulation goal and ROM goal. Pt participates well with PT and does demonstrate good faith effort; however, intermittent groin pain and activity-associated hip pain has limited ability to fully perform late-phase rehab activities. Pt has f/u with surgeon on Tuesday to discuss his current condition. Pt is currently working 4-hour days at car dealership and feels this is most he can do at this time. Pt has to walk across facility and drive to various locations in town for conducting business for his work role. Pt is currently at end of  current worker's comp authorization. Pt has remaining deficits in hip flexor, quadriceps, and adductor flexibility/extensibility; hip ABD/EXT/flexor strength, weightbearing tolerance, gait deviations, and limitations with negotiating steps/curbs/uneven terrain. Patient will benefit from continued skilled therapeutic intervention to address the remaining deficits in hip strength, mobility, and weightbearing tolerance as needed for improved function and ability to perform work duties.      OBJECTIVE IMPAIRMENTS: Abnormal gait, decreased activity tolerance, decreased balance, decreased endurance, decreased knowledge of condition, decreased knowledge of use of DME, decreased mobility, difficulty walking, decreased ROM, decreased strength, and dizziness.    ACTIVITY LIMITATIONS: carrying, lifting, bending, sitting, standing, squatting, sleeping, stairs, transfers, dressing, and locomotion level   PARTICIPATION LIMITATIONS: cleaning, shopping, community activity, occupation, and yard work   PERSONAL FACTORS:  Pt is impulsive and eager to get  better so that he can return to work.   are also affecting patient's functional outcome.    REHAB POTENTIAL: Good   CLINICAL DECISION MAKING: Stable/uncomplicated   EVALUATION COMPLEXITY: Low     GOALS: Goals reviewed with patient? Yes   SHORT TERM GOALS: Target date: 12/22/2022 Pt will improve L hip ROM to 10 to 90 degrees without pain in order to promote normal gait and stability.  Baseline: 20 degree Hip felx and 0 deg Ext 12/04/22: 90 deg hip flex and 5 deg EXT 12/18/22: 90 deg hip flex and 10 deg hip EXT Goal status: ACHIEVED    2.  Pt will be able to ambulate 53ft safely with Cane without fall and pain . Baseline: 30 ft with SBA and heavy reliance on [facility].   12/04/22: Pt uses SPC for primary means of mobility in home and community at this time.  Goal status: ACHIEVED       LONG TERM GOALS: Target date: 01/26/2023   Pt will Improve TUG time to <10secs without SPC to demonstrate decreased fall risk.  Baseline: 19 secs with SPC 12/04/22: 14.6 seconds with SPC 12/18/22: 9.5 sec Goal status: ACHIEVED   2.  Pt will Improve LEFS score to >65/80 to demonstrate improved QoL Baseline: 18/80 12/04/22: 20/80  12/18/22: 22/80 01/29/23: 25/80  03/15/23: 24/80 04/09/23: 27/80  04/30/23: 26/80 05/16/23:  19/80 Goal status: NOT MET   3.  Pt will be able to ambulate Independently  with LRAD without LOB and pain in order to return to community level mobility safely  Baseline: With [facility] and FWW.   12/04/22: ModI ambulation with SPC/LRAD with mild pain at longer distances, no LOB 12/18/22: Amb IND with SPC with no LOB Goal status: ACHIEVED    4.  Pt will score >50 on FOTO Baseline: 36/56 12/04/22: 35/56 12/18/22: 37/56 01/29/23: 43/56 03/15/23: 38/56 04/09/23: 43/56 04/30/23: 41/56 05/16/23:  38/56 Goal status: NOT MET   5.  Pt will demonstrate 4/5 L hip strength without pain to improve stability and safety with all functional mobility in order to return to work.  Baseline: 2/5 grossly    12/04/22:  4-/5 grossly 12/18/22: 4-/5 grossly 01/29/23: 4/5 for hip flexors/quads/HS, 3+/5 for hip ABD and EXT.  03/15/23: Remaining weakness in hip flexors, quads, abductors, extensors.  04/09/23: Improved strength with pt up to at least 4/5, pain with loading hip flexors and abductors  04/30/23: Weak hip abduction and extension MMT with heightened pain; good strength otherwise 05/16/23: Weak hip flexion, abduction, extension; weak quads; pain with hip flexor and gluteal MMTs Goal status: NOT MET        PLAN:   PT FREQUENCY: 2x/week   PT DURATION:  4 weeks   PLANNED INTERVENTIONS: Therapeutic exercises, Therapeutic activity, Neuromuscular re-education, Balance training, Gait training, Patient/Family education, Self Care, and Joint mobilization   PLAN FOR NEXT SESSION: Continue with strategies for pain modulation prn for L quad/hip flexor region and progress trunk stabilization and hip strengthening as able (focus on hip abductor/extensor strengthening); progressive weightbearing activity as tolerated.     Consuela Mimes, PT, DPT [phone] Physical Therapist- [facility]  06/05/2023, 2:42 PM

## 2023-06-07 ENCOUNTER — Encounter: Payer: Self-pay | Admitting: Physical Therapy

## 2023-06-07 ENCOUNTER — Ambulatory Visit: Payer: Worker's Compensation | Admitting: Physical Therapy

## 2023-06-07 VITALS — BP 134/73

## 2023-06-07 DIAGNOSIS — R2689 Other abnormalities of gait and mobility: Secondary | ICD-10-CM

## 2023-06-07 DIAGNOSIS — M25552 Pain in left hip: Secondary | ICD-10-CM

## 2023-06-07 DIAGNOSIS — R29898 Other symptoms and signs involving the musculoskeletal system: Secondary | ICD-10-CM

## 2023-06-07 DIAGNOSIS — Z96642 Presence of left artificial hip joint: Secondary | ICD-10-CM | POA: Diagnosis not present

## 2023-06-07 DIAGNOSIS — R262 Difficulty in walking, not elsewhere classified: Secondary | ICD-10-CM

## 2023-06-07 NOTE — Therapy (Signed)
OUTPATIENT PHYSICAL THERAPY TREATMENT  Patient Name: Christian Hughes MRN: [id number] DOB:[birth year], 62 y.o., male Today's Date: 06/07/2023   END OF SESSION:   PT End of Session - 06/07/23 1515     Visit Number 42    Number of Visits 43    Authorization Type Workers Compensation    PT Start Time 1514    PT Stop Time 1555    PT Time Calculation (min) 41 min    Activity Tolerance Patient tolerated treatment well;Patient limited by pain    Behavior During Therapy WFL for tasks assessed/performed;Impulsive              Past Medical History:  Diagnosis Date   Engages in vaping    H/O ventral hernia    S/p of repair   Hypertension Early 2010's   Marijuana use    Orthostatic dizziness 10/08/2022   Past Surgical History:  Procedure Laterality Date   HIP ARTHROPLASTY Left 10/07/2022   Procedure: ARTHROPLASTY BIPOLAR HIP (HEMIARTHROPLASTY);  Surgeon: Juanell Fairly, MD;  Location: [facility] ORS;  Service: Orthopedics;  Laterality: Left;   JOINT REPLACEMENT  10/06/2022   Left Hip Replacement due fall while at work   Ventral hernia repair     Patient Active Problem List   Diagnosis Date Noted   Asymptomatic hypertensive urgency 04/05/2023   Osteopenia determined by x-ray 02/12/2023   Hyperlipidemia 01/10/2023   Prediabetes 01/10/2023   Primary hypertension 01/10/2023   Left ventricular hypertrophy 01/10/2023   Abnormal EKG 10/06/2022   Bone lesion_sclerotic lesions in the right iliac bone 10/06/2022    PCP: Dewaine Oats MD  REFERRING PROVIDER: Juanell Fairly MD   REFERRING DIAG: [phone] (ICD-10-CM) - Presence of left artificial hip joint   THERAPY DIAG:  History of left hip hemiarthroplasty  Pain in left hip  Weakness of left leg  Balance problems  Difficulty in walking, not elsewhere classified  Rationale for Evaluation and Treatment Rehabilitation  PERTINENT HISTORY: From initial eval: Christian Hughes is a 62 y.o. male without significant past medical history  except for occasional vaping amd marijuana use, who presents with fall and left hip pain.  Pt states that he slipped and fell at work at about 11:00 AM.  Denies loss of consciousness.  He injured his left hip, causing left hip pain, which is constant, sharp, severe, nonradiating, aggravated by movement.  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    "My mom took care of me after my hip surgery. I had therapy at home. I returned to my home alone after my first follow up appointment. I have pain in my buttocks, groin and the corner of my buttocks of about 2 to 3/10. I have high pain tolerance. I am unable to walk normal and I feel weak. I began to use this cane on my own after my PT at home stopped.  I can't wait to go to work because, I love to work."   PAIN:  Are you having pain? Yes: Pain location: 2-3/10 Pain description: stabbing Aggravating factors: Moving, walking, WB   Relieving factors: rest, OTC meds, ice    WEIGHT BEARING RESTRICTIONS: Yes:  WBAT   FALLS:  Has patient fallen in last 6 months? Yes. 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 Pt negotiates some small staircases in town. Pt has historically taken 24-packs of water bottles to his work for customers.    PLOF: Independent   PATIENT GOALS: "I want to return to work without hurting and falling as soon as I can."   PRECAUTIONS: Posterior hip precautions      OBJECTIVE: (objective measures completed at initial evaluation unless otherwise  dated)  PALPATION: Pain in L sacral border, Iliac crest, gluteus medius, TFL, and greater trochanter     LOWER EXTREMITY ROM:    Active ROM Right eval Left eval  Hip flexion WFL 20 deg with pain  Hip extension [facility]    Hip abduction WFL 20 with pain   Hip adduction [facility]    Hip internal rotation [facility]    Hip external rotation [facility]    Knee flexion [facility]  Knee extension [facility]  Ankle dorsiflexion [facility]  Ankle plantarflexion [facility]  Ankle inversion [facility]  Ankle eversion WFL WFL   (Blank rows = not tested)   LOWER EXTREMITY MMT:   MMT Right eval Left eval Left 12/04/22 Left 12/18/22 Left 01/29/23 Left 03/15/23 Left 04/11/23 Left 04/30/23 Left 05/16/23  Hip flexion WFL 2+ 4* 4- 4 4* 4* 4+ 4*  Hip extension WFL 2 3+  3+* 3+* 4 3+* 4-*  Hip abduction WFl 2 4-  3+* 4* 4* 3+* 3+*  Hip adduction WFl           Hip internal rotation [facility]           Hip external rotation [facility]           Knee flexion WFL 3- 3+ 4- 4 4+* 5 5 4+  Knee extension WFl 3 4+ 4* 4* 4-* 4+ 5 4*  Ankle dorsiflexion WFL 3+         Ankle plantarflexion WFL 4         Ankle inversion WFL 4         Ankle eversion WFL 3-          (Blank rows = not tested) Pt's L ankle remains inverted 10 degrees since the Surgery.     FUNCTIONAL TESTS (INITIAL EVALUATION) 30 seconds chair stand test: 5 reps completed Timed up and go (TUG): 19secs 10 meter walk test: 12secs= 1,2 m/secs             Balance Test : SL standing unable, Modified tandem 10 secs, Tandem Unable, Rhomberg with EO 5 secs and unable with EC.  GAIT: Distance walked: 30 ft  Assistive device utilized: Single point cane Level of assistance: SBA Comments: Pt unsteady on feet with severe antalgic gait, decreased loading response, absent heel strike and absent toe off gait with uneven stride and decreased speed. Heavy reliance on the [facility]   -----  03/27/23  Posture: Lumbar lordosis: WNL Iliac crest height: Equal bilaterally Lumbar lateral shift: Mild shift to  R to offload LLE  AROM AROM (Normal range in degrees) AROM   Lumbar   Flexion (65) 75% (stopped at 90 deg hip flexion)  Extension (30) 50%  Right lateral flexion (25) 100%  Left lateral flexion (25) 100%  Right rotation (30) 75%  Left rotation (30) 75%      (* =  pain; Blank rows = not tested)   Sensation Grossly intact to light touch throughout bilateral LEs as determined by testing dermatomes L2-S2. Proprioception, stereognosis, and hot/cold testing deferred on this date.   Special Tests Lumbar Radiculopathy and Discogenic: Centralization and Peripheralization (SN 92, -LR 0.12): Not examined Slump (SN 83, -LR 0.32): R: Negative L: Negative SLR (SN 92, -LR 0.29): R: Negative L:  Negative  Facet Joint: Extension-Rotation (SN 100, -LR 0.0): R: Negative L: Positive  Lumbar Foraminal Stenosis: Lumbar quadrant (SN 70): R: Negative L: Positive      TODAY'S TREATMENT:                                                                                                                              DATE: 06/07/2023    L hip hemiarthroplasty following L femoral neck fracture (DOS: 10/07/22)   SUBJECTIVE STATEMENT:  Pt reports 3/10 baseline pain affecting L groin region at arrival to PT. Patient reports working on walking with maintaining toe forward versus internally rotated while at work and focusing on control of heel strike to foot flat position.     Today's Vitals   06/07/23 1516 06/07/23 1517  BP: (!) 142/95 134/73     There is no height or weight on file to calculate BMI.    Therapeutic Exercise - for improved soft tissue flexibility and extensibility as needed for ROM, improved strength as needed to improve performance of CKC activities/functional movements   Nu-Step L3. Seat at 12 to maintain posterior hip flexion precautions. 5 minutes, for warm up  Ambulate laps around gym, no AD; x 2   -focus on limiting LLE IR  -heel to toe progression emphasized  Glute  bridge with single-leg knee extension, alternating;  2x8 on each LE   Sidelying hip abduction, no weight; 2x8  Standing heel raise/toe raise; 2x15 alternating   Hurdle step over 6-inch hurdle with focus on heel strike and heel to toe progression; 2 x10 ea LE  Sidestep with Blue Tband superior to patellae; 4x D/B length of // bars   *not today* Cold pack (unbilled) - for anti-inflammatory and analgesic effect during e-stim as needed for reduced pain and improved ability to participate in active PT intervention, along L anterior in supine, x 8 minutes    PATIENT EDUCATION: We discussed ongoing HEP and use of hurdle stepping drill at home to re-train dorsiflexor control following heel strike.     *not today* Lateral step up to 6-inch step; staircase in center of gym; 1x12  -pt has limited tolerance of CKC loading with vaulting off of LE on floor and lateral flexion to L when weight shifting to LLE Glute bridge with Green physioball under calves; 3x10 Manual prone quad stretch; 2 x 30 sec Prone quad stretch with bedsheet looped around ankle; x 30 sec  -for HEP demo  -pt has hamstring cramp when attempted gactive prone knee flexion hampering initiating of prone quad stretch [employer]  kicker along blue agility ladder; 2x D/B length of ladder for active quad/hip flexor mobility work and weight shifting Side steps along blue agility ladder: x2 D/B with Blue Tband looped above patellae Alternating arms and legs with abdominal brace with Green physioball; x20 alternating R/L Forward step-up to opposite LE tap on next step; 2x10, 6-inch step (staircase center of clinic) Alternating hip flexion with Tband, in standing today; 2x8 alternating R/L with Red Tband Forward step over (3) 6-inch hurdles and (2) 12-inch hurdles, unsupported in open gym environment; 3x D/B with reciprocal patternProne hip extension, alternating; 2x10 alt; 2-lb ankle weights Butterfly stretch, technique reviewed for  HEP Modified Thomas hip flexor stretch, technique reviewed for HEP Forward step up to Airex pad; 1x15 surgical LE leading  Prone knee bend, dynamic stretch; 1x10, 1 sec; for review Toe tapping; 2x10 alternating R/L with 12-inch step, no UE support; stance on Airex padThomas hip flexor stretch; reviewed Butterfly stretch in supine; reviewed Manual hip flexor stretch in R sidelying, gentle (hip to neutral); 2 x 30 sec Standing march with 5-lb ankle weights; 2 x 10 alternating, holding treadmill armrest Sit to stand: 1x6 and 1x4; from chair with 8-lb Goblet hold  Unipedal stance; 2 x 15 sec Bridge 3x12:  Black Tband at distal femurs with good carryover in form/technique.  Butterfly stretch; 3 x 20 sec        PATIENT EDUCATION:  Education details: see above for patient education details Person educated: Patient Education method: Explanation and Demonstration Education comprehension: verbalized understanding   HOME EXERCISE PROGRAM: Access Code: [email or web address] URL: [email or web address] Date: 04/18/2023 Prepared by: Consuela Mimes  Exercises - Sit to Stand  - 1 x daily - 7 x weekly - 2 sets - 10 reps - Supine Bridge with Resistance Band  - 1 x daily - 7 x weekly - 2 sets - 10 reps - Sidelying Hip Abduction  - 1 x daily - 7 x weekly - 2 sets - 10 reps - Prone Hip Extension  - 1 x daily - 7 x weekly - 2 sets - 10 reps - Supine Hip Flexion with Resistance Loop  - 1 x daily - 7 x weekly - 2 sets - 10 reps - Prone Femoral Nerve Mobilization  - 2 x daily - 7 x weekly - 2 sets - 10 reps - 1sec hold     ASSESSMENT:   CLINICAL IMPRESSION:  Patient demonstrates improved control of toe-in during late swing phase and heel strike/foot flat during gait. He has some remaining foot slap of LLE remaining. He has ongoing low-level pain generally affecting him with weightbearing activity; no pain in unloaded position at rest. Pt demonstrates markedly improved antalgic gait pattern and  decreased magnitude of compensated Trendelenburg. Pt has remaining deficits in hip flexor, quadriceps, and adductor flexibility/extensibility; hip ABD/EXT/flexor strength, weightbearing tolerance, gait deviations (L hip IR, toe-in, dec eccentric control of L dorsiflexors), and limitations with negotiating steps/curbs/uneven terrain. Patient will benefit from continued skilled therapeutic intervention to address the remaining deficits in hip strength, mobility, and weightbearing tolerance as needed for improved function and ability to perform work duties.      OBJECTIVE IMPAIRMENTS: Abnormal gait, decreased activity tolerance, decreased balance, decreased endurance, decreased knowledge of condition, decreased knowledge of use of DME, decreased mobility, difficulty walking, decreased ROM, decreased strength, and dizziness.    ACTIVITY LIMITATIONS: carrying, lifting, bending, sitting, standing, squatting, sleeping, stairs, transfers, dressing, and locomotion level   PARTICIPATION LIMITATIONS: cleaning, shopping, community activity, occupation, and yard  work   PERSONAL FACTORS:  Pt is impulsive and eager to get better so that he can return to work.   are also affecting patient's functional outcome.    REHAB POTENTIAL: Good   CLINICAL DECISION MAKING: Stable/uncomplicated   EVALUATION COMPLEXITY: Low     GOALS: Goals reviewed with patient? Yes   SHORT TERM GOALS: Target date: 12/22/2022 Pt will improve L hip ROM to 10 to 90 degrees without pain in order to promote normal gait and stability.  Baseline: 20 degree Hip felx and 0 deg Ext 12/04/22: 90 deg hip flex and 5 deg EXT 12/18/22: 90 deg hip flex and 10 deg hip EXT Goal status: ACHIEVED    2.  Pt will be able to ambulate 561ft safely with Cane without fall and pain . Baseline: 30 ft with SBA and heavy reliance on [facility].   12/04/22: Pt uses SPC for primary means of mobility in home and community at this time.  Goal status: ACHIEVED       LONG  TERM GOALS: Target date: 01/26/2023   Pt will Improve TUG time to <10secs without SPC to demonstrate decreased fall risk.  Baseline: 19 secs with SPC 12/04/22: 14.6 seconds with SPC 12/18/22: 9.5 sec Goal status: ACHIEVED   2.  Pt will Improve LEFS score to >65/80 to demonstrate improved QoL Baseline: 18/80 12/04/22: 20/80  12/18/22: 22/80 01/29/23: 25/80  03/15/23: 24/80 04/09/23: 27/80  04/30/23: 26/80 05/16/23:  19/80 Goal status: NOT MET   3.  Pt will be able to ambulate Independently  with LRAD without LOB and pain in order to return to community level mobility safely  Baseline: With [facility] and [facility].   12/04/22: ModI ambulation with SPC/LRAD with mild pain at longer distances, no LOB 12/18/22: Amb IND with SPC with no LOB Goal status: ACHIEVED    4.  Pt will score >50 on FOTO Baseline: 36/56 12/04/22: 35/56 12/18/22: 37/56 01/29/23: 43/56 03/15/23: 38/56 04/09/23: 43/56 04/30/23: 41/56 05/16/23:  38/56 Goal status: NOT MET   5.  Pt will demonstrate 4/5 L hip strength without pain to improve stability and safety with all functional mobility in order to return to work.  Baseline: 2/5 grossly    12/04/22: 4-/5 grossly 12/18/22: 4-/5 grossly 01/29/23: 4/5 for hip flexors/quads/HS, 3+/5 for hip ABD and EXT.  03/15/23: Remaining weakness in hip flexors, quads, abductors, extensors.  04/09/23: Improved strength with pt up to at least 4/5, pain with loading hip flexors and abductors  04/30/23: Weak hip abduction and extension MMT with heightened pain; good strength otherwise 05/16/23: Weak hip flexion, abduction, extension; weak quads; pain with hip flexor and gluteal MMTs Goal status: NOT MET        PLAN:   PT FREQUENCY: 2x/week   PT DURATION: 4 weeks   PLANNED INTERVENTIONS: Therapeutic exercises, Therapeutic activity, Neuromuscular re-education, Balance training, Gait training, Patient/Family education, Self Care, and Joint mobilization   PLAN FOR NEXT SESSION: Continue with strategies for pain  modulation prn for L quad/hip flexor region and progress trunk stabilization and hip strengthening as able (focus on hip abductor/extensor strengthening); progressive weightbearing activity as tolerated.     Consuela Mimes, PT, DPT [phone] Physical Therapist- [facility]  06/07/2023, 3:20 PM

## 2023-06-12 ENCOUNTER — Encounter: Payer: Self-pay | Admitting: Physical Therapy

## 2023-06-12 ENCOUNTER — Ambulatory Visit: Payer: Worker's Compensation | Admitting: Physical Therapy

## 2023-06-12 VITALS — BP 134/90 | HR 73

## 2023-06-12 DIAGNOSIS — R262 Difficulty in walking, not elsewhere classified: Secondary | ICD-10-CM

## 2023-06-12 DIAGNOSIS — Z96642 Presence of left artificial hip joint: Secondary | ICD-10-CM | POA: Diagnosis not present

## 2023-06-12 DIAGNOSIS — M25552 Pain in left hip: Secondary | ICD-10-CM

## 2023-06-12 DIAGNOSIS — R2689 Other abnormalities of gait and mobility: Secondary | ICD-10-CM

## 2023-06-12 DIAGNOSIS — R29898 Other symptoms and signs involving the musculoskeletal system: Secondary | ICD-10-CM

## 2023-06-12 NOTE — Therapy (Signed)
OUTPATIENT PHYSICAL THERAPY TREATMENT  Patient Name: Christian Hughes MRN: [id number] DOB:09/17/1960, 62 y.o., male Today's Date: 06/12/2023   END OF SESSION:   PT End of Session - 06/12/23 1346     Visit Number 43    Number of Visits 50    Authorization Type Workers Compensation    PT Start Time 1345    PT Stop Time 1425    PT Time Calculation (min) 40 min    Activity Tolerance Patient tolerated treatment well;Patient limited by pain    Behavior During Therapy WFL for tasks assessed/performed;Impulsive             Past Medical History:  Diagnosis Date   Engages in vaping    H/O ventral hernia    S/p of repair   Hypertension Early 2010's   Marijuana use    Orthostatic dizziness 10/08/2022   Past Surgical History:  Procedure Laterality Date   HIP ARTHROPLASTY Left 10/07/2022   Procedure: ARTHROPLASTY BIPOLAR HIP (HEMIARTHROPLASTY);  Surgeon: Juanell Fairly, MD;  Location: [facility] ORS;  Service: Orthopedics;  Laterality: Left;   JOINT REPLACEMENT  10/06/2022   Left Hip Replacement due fall while at work   Ventral hernia repair     Patient Active Problem List   Diagnosis Date Noted   Asymptomatic hypertensive urgency 04/05/2023   Osteopenia determined by x-ray 02/12/2023   Hyperlipidemia 01/10/2023   Prediabetes 01/10/2023   Primary hypertension 01/10/2023   Left ventricular hypertrophy 01/10/2023   Abnormal EKG 10/06/2022   Bone lesion_sclerotic lesions in the right iliac bone 10/06/2022    PCP: Dewaine Oats MD  REFERRING PROVIDER: Juanell Fairly MD   REFERRING DIAG: [phone] (ICD-10-CM) - Presence of left artificial hip joint   THERAPY DIAG:  History of left hip hemiarthroplasty  Pain in left hip  Weakness of left leg  Balance problems  Difficulty in walking, not elsewhere classified  Rationale for Evaluation and Treatment Rehabilitation  PERTINENT HISTORY: From initial eval: Christian Hughes is a 62 y.o. male without significant past medical history  except for occasional vaping amd marijuana use, who presents with fall and left hip pain.  Pt states that he slipped and fell at work at about 11:00 AM.  Denies loss of consciousness.  He injured his left hip, causing left hip pain, which is constant, sharp, severe, nonradiating, aggravated by movement.  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    "My mom took care of me after my hip surgery. I had therapy at home. I returned to my home alone after my first follow up appointment. I have pain in my buttocks, groin and the corner of my buttocks of about 2 to 3/10. I have high pain tolerance. I am unable to walk normal and I feel weak. I began to use this cane on my own after my PT at home stopped.  I can't wait to go to work because, I love to work."   PAIN:  Are you having pain? Yes: Pain location: 2-3/10 Pain description: stabbing Aggravating factors: Moving, walking, WB   Relieving factors: rest, OTC meds, ice    WEIGHT BEARING RESTRICTIONS: Yes:  WBAT   FALLS:  Has patient fallen in last 6 months? Yes. 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 Pt negotiates some small staircases in town. Pt has historically taken 24-packs of water bottles to his work for customers.    PLOF: Independent   PATIENT GOALS: "I want to return to work without hurting and falling as soon as I can."   PRECAUTIONS: Posterior hip precautions      OBJECTIVE: (objective measures completed at initial evaluation unless otherwise  dated)  PALPATION: Pain in L sacral border, Iliac crest, gluteus medius, TFL, and greater trochanter     LOWER EXTREMITY ROM:    Active ROM Right eval Left eval  Hip flexion WFL 20 deg with pain  Hip extension [facility]    Hip abduction WFL 20 with pain   Hip adduction [facility]    Hip internal rotation [facility]    Hip external rotation [facility]    Knee flexion [facility]  Knee extension [facility]  Ankle dorsiflexion [facility]  Ankle plantarflexion [facility]  Ankle inversion [facility]  Ankle eversion WFL WFL   (Blank rows = not tested)   LOWER EXTREMITY MMT:   MMT Right eval Left eval Left 12/04/22 Left 12/18/22 Left 01/29/23 Left 03/15/23 Left 04/11/23 Left 04/30/23 Left 05/16/23  Hip flexion WFL 2+ 4* 4- 4 4* 4* 4+ 4*  Hip extension WFL 2 3+  3+* 3+* 4 3+* 4-*  Hip abduction WFl 2 4-  3+* 4* 4* 3+* 3+*  Hip adduction WFl           Hip internal rotation [facility]           Hip external rotation [facility]           Knee flexion WFL 3- 3+ 4- 4 4+* 5 5 4+  Knee extension WFl 3 4+ 4* 4* 4-* 4+ 5 4*  Ankle dorsiflexion WFL 3+         Ankle plantarflexion WFL 4         Ankle inversion WFL 4         Ankle eversion WFL 3-          (Blank rows = not tested) Pt's L ankle remains inverted 10 degrees since the Surgery.     FUNCTIONAL TESTS (INITIAL EVALUATION) 30 seconds chair stand test: 5 reps completed Timed up and go (TUG): 19secs 10 meter walk test: 12secs= 1,2 m/secs             Balance Test : SL standing unable, Modified tandem 10 secs, Tandem Unable, Rhomberg with EO 5 secs and unable with EC.  GAIT: Distance walked: 30 ft  Assistive device utilized: Single point cane Level of assistance: SBA Comments: Pt unsteady on feet with severe antalgic gait, decreased loading response, absent heel strike and absent toe off gait with uneven stride and decreased speed. Heavy reliance on the [facility]   -----  03/27/23  Posture: Lumbar lordosis: WNL Iliac crest height: Equal bilaterally Lumbar lateral shift: Mild shift to  R to offload LLE  AROM AROM (Normal range in degrees) AROM   Lumbar   Flexion (65) 75% (stopped at 90 deg hip flexion)  Extension (30) 50%  Right lateral flexion (25) 100%  Left lateral flexion (25) 100%  Right rotation (30) 75%  Left rotation (30) 75%      (* =  pain; Blank rows = not tested)   Sensation Grossly intact to light touch throughout bilateral LEs as determined by testing dermatomes L2-S2. Proprioception, stereognosis, and hot/cold testing deferred on this date.   Special Tests Lumbar Radiculopathy and Discogenic: Centralization and Peripheralization (SN 92, -LR 0.12): Not examined Slump (SN 83, -LR 0.32): R: Negative L: Negative SLR (SN 92, -LR 0.29): R: Negative L:  Negative  Facet Joint: Extension-Rotation (SN 100, -LR 0.0): R: Negative L: Positive  Lumbar Foraminal Stenosis: Lumbar quadrant (SN 70): R: Negative L: Positive      TODAY'S TREATMENT:                                                                                                                              DATE: 06/12/2023    L hip hemiarthroplasty following L femoral neck fracture (DOS: 10/07/22)   SUBJECTIVE STATEMENT:  Pt reports minimal symptoms at arrival - "not bothering me." Patient reports 2-3/10 NPRS at arrival. Patient reports compliance with HEP. No new complaints this afternoon. Pt reports soreness after last visit that had subsided later in the day.     Today's Vitals   06/12/23 1347 06/12/23 1348  BP: (!) 144/94 (!) 134/90  Pulse: 73       There is no height or weight on file to calculate BMI.    Therapeutic Exercise - for improved soft tissue flexibility and extensibility as needed for ROM, improved strength as needed to improve performance of CKC activities/functional movements   Nu-Step L3. Seat at 12 to maintain posterior hip flexion precautions. 5 minutes, for warm up  Ambulate laps around gym, no AD; x 2   -focus on limiting LLE IR  -heel to toe  progression emphasized  Glute bridge with single-leg knee extension, alternating;  2x8 on each LE   Sidelying hip abduction, with 1.5-lb ankle weight; 2x8  Standing heel raise/toe raise; 2x15 alternating   Forward monster walk (forward step + abduction) with Red Tband; 5x D/B length of // bars  Hurdle step over 6-inch hurdle with focus on heel strike and heel to toe progression; 2 x10 ea LE    *not today* Sidestep with Blue Tband superior to patellae; 4x D/B length of // bars Cold pack (unbilled) - for anti-inflammatory and analgesic effect during e-stim as needed for reduced pain and improved ability to participate in active PT intervention, along L anterior in supine, x 8 minutes    PATIENT EDUCATION: We discussed continued work on dorsiflexor motor control at home and focusing on toe-out and heel to toe progression when walking.    *not today* Lateral step up to 6-inch step; staircase in center of gym; 1x12  -pt has limited tolerance of CKC loading with vaulting off of LE on floor and lateral flexion to L when weight shifting to LLE Glute bridge with Green physioball under calves; 3x10 Manual prone quad stretch; 2 x 30 sec Prone quad stretch with bedsheet looped around  ankle; x 30 sec  -for HEP demo  -pt has hamstring cramp when attempted gactive prone knee flexion hampering initiating of prone quad stretch Butt kicker along blue agility ladder; 2x D/B length of ladder for active quad/hip flexor mobility work and weight shifting Side steps along blue agility ladder: x2 D/B with Blue Tband looped above patellae Alternating arms and legs with abdominal brace with Green physioball; x20 alternating R/L Forward step-up to opposite LE tap on next step; 2x10, 6-inch step (staircase center of clinic) Alternating hip flexion with Tband, in standing today; 2x8 alternating R/L with Red Tband Forward step over (3) 6-inch hurdles and (2) 12-inch hurdles, unsupported in open gym  environment; 3x D/B with reciprocal patternProne hip extension, alternating; 2x10 alt; 2-lb ankle weights Butterfly stretch, technique reviewed for HEP Modified Thomas hip flexor stretch, technique reviewed for HEP Forward step up to Airex pad; 1x15 surgical LE leading  Prone knee bend, dynamic stretch; 1x10, 1 sec; for review Toe tapping; 2x10 alternating R/L with 12-inch step, no UE support; stance on Airex padThomas hip flexor stretch; reviewed Butterfly stretch in supine; reviewed Manual hip flexor stretch in R sidelying, gentle (hip to neutral); 2 x 30 sec Standing march with 5-lb ankle weights; 2 x 10 alternating, holding treadmill armrest Sit to stand: 1x6 and 1x4; from chair with 8-lb Goblet hold  Unipedal stance; 2 x 15 sec Bridge 3x12:  Black Tband at distal femurs with good carryover in form/technique.  Butterfly stretch; 3 x 20 sec        PATIENT EDUCATION:  Education details: see above for patient education details Person educated: Patient Education method: Explanation and Demonstration Education comprehension: verbalized understanding   HOME EXERCISE PROGRAM: Access Code: [email or web address] URL: [email or web address] Date: 04/18/2023 Prepared by: Consuela Mimes  Exercises - Sit to Stand  - 1 x daily - 7 x weekly - 2 sets - 10 reps - Supine Bridge with Resistance Band  - 1 x daily - 7 x weekly - 2 sets - 10 reps - Sidelying Hip Abduction  - 1 x daily - 7 x weekly - 2 sets - 10 reps - Prone Hip Extension  - 1 x daily - 7 x weekly - 2 sets - 10 reps - Supine Hip Flexion with Resistance Loop  - 1 x daily - 7 x weekly - 2 sets - 10 reps - Prone Femoral Nerve Mobilization  - 2 x daily - 7 x weekly - 2 sets - 10 reps - 1sec hold     ASSESSMENT:   CLINICAL IMPRESSION:  Patient has minimal gait deviations remaining, though compensated Trendelenburg can worsen briefly following gluteus medius strengthening drills. Pt has ongoing tightness with hip abduction and  weakness of gluteal musculature. Pt has subtle asymmetry with eccentric control of dorsiflexors between L and RLE. Pt needs continued work on motor control to normalize gait and progressive hip strengthening. Pt has remaining deficits in hip flexor, quadriceps, and adductor flexibility/extensibility; hip ABD/EXT/flexor strength, weightbearing tolerance, gait deviations (L hip IR, toe-in, dec eccentric control of L dorsiflexors), and limitations with negotiating steps/curbs/uneven terrain. Patient will benefit from continued skilled therapeutic intervention to address the remaining deficits in hip strength, mobility, and weightbearing tolerance as needed for improved function and ability to perform work duties.      OBJECTIVE IMPAIRMENTS: Abnormal gait, decreased activity tolerance, decreased balance, decreased endurance, decreased knowledge of condition, decreased knowledge of use of DME, decreased mobility, difficulty walking, decreased ROM, decreased strength, and dizziness.  ACTIVITY LIMITATIONS: carrying, lifting, bending, sitting, standing, squatting, sleeping, stairs, transfers, dressing, and locomotion level   PARTICIPATION LIMITATIONS: cleaning, shopping, community activity, occupation, and yard work   PERSONAL FACTORS:  Pt is impulsive and eager to get better so that he can return to work.   are also affecting patient's functional outcome.    REHAB POTENTIAL: Good   CLINICAL DECISION MAKING: Stable/uncomplicated   EVALUATION COMPLEXITY: Low     GOALS: Goals reviewed with patient? Yes   SHORT TERM GOALS: Target date: 12/22/2022 Pt will improve L hip ROM to 10 to 90 degrees without pain in order to promote normal gait and stability.  Baseline: 20 degree Hip felx and 0 deg Ext 12/04/22: 90 deg hip flex and 5 deg EXT 12/18/22: 90 deg hip flex and 10 deg hip EXT Goal status: ACHIEVED    2.  Pt will be able to ambulate 562ft safely with Cane without fall and pain . Baseline: 30 ft with  SBA and heavy reliance on [facility].   12/04/22: Pt uses SPC for primary means of mobility in home and community at this time.  Goal status: ACHIEVED       LONG TERM GOALS: Target date: 01/26/2023   Pt will Improve TUG time to <10secs without SPC to demonstrate decreased fall risk.  Baseline: 19 secs with SPC 12/04/22: 14.6 seconds with SPC 12/18/22: 9.5 sec Goal status: ACHIEVED   2.  Pt will Improve LEFS score to >65/80 to demonstrate improved QoL Baseline: 18/80 12/04/22: 20/80  12/18/22: 22/80 01/29/23: 25/80  03/15/23: 24/80 04/09/23: 27/80  04/30/23: 26/80 05/16/23:  19/80 Goal status: NOT MET   3.  Pt will be able to ambulate Independently  with LRAD without LOB and pain in order to return to community level mobility safely  Baseline: With [facility] and FWW.   12/04/22: ModI ambulation with SPC/LRAD with mild pain at longer distances, no LOB 12/18/22: Amb IND with SPC with no LOB Goal status: ACHIEVED    4.  Pt will score >50 on FOTO Baseline: 36/56 12/04/22: 35/56 12/18/22: 37/56 01/29/23: 43/56 03/15/23: 38/56 04/09/23: 43/56 04/30/23: 41/56 05/16/23:  38/56 Goal status: NOT MET   5.  Pt will demonstrate 4/5 L hip strength without pain to improve stability and safety with all functional mobility in order to return to work.  Baseline: 2/5 grossly    12/04/22: 4-/5 grossly 12/18/22: 4-/5 grossly 01/29/23: 4/5 for hip flexors/quads/HS, 3+/5 for hip ABD and EXT.  03/15/23: Remaining weakness in hip flexors, quads, abductors, extensors.  04/09/23: Improved strength with pt up to at least 4/5, pain with loading hip flexors and abductors  04/30/23: Weak hip abduction and extension MMT with heightened pain; good strength otherwise 05/16/23: Weak hip flexion, abduction, extension; weak quads; pain with hip flexor and gluteal MMTs Goal status: NOT MET        PLAN:   PT FREQUENCY: 2x/week   PT DURATION: 4 weeks   PLANNED INTERVENTIONS: Therapeutic exercises, Therapeutic activity, Neuromuscular  re-education, Balance training, Gait training, Patient/Family education, Self Care, and Joint mobilization   PLAN FOR NEXT SESSION: Continue with strategies for pain modulation prn for L quad/hip flexor region and progress trunk stabilization and hip strengthening as able (focus on hip abductor/extensor strengthening); progressive weightbearing activity as tolerated.     Consuela Mimes, PT, DPT [phone] Physical Therapist- [facility]  06/12/2023, 2:07 PM

## 2023-06-14 ENCOUNTER — Ambulatory Visit: Payer: Worker's Compensation

## 2023-06-14 DIAGNOSIS — R29898 Other symptoms and signs involving the musculoskeletal system: Secondary | ICD-10-CM

## 2023-06-14 DIAGNOSIS — Z96642 Presence of left artificial hip joint: Secondary | ICD-10-CM | POA: Diagnosis not present

## 2023-06-14 DIAGNOSIS — M25552 Pain in left hip: Secondary | ICD-10-CM

## 2023-06-14 DIAGNOSIS — R2689 Other abnormalities of gait and mobility: Secondary | ICD-10-CM

## 2023-06-14 DIAGNOSIS — R262 Difficulty in walking, not elsewhere classified: Secondary | ICD-10-CM

## 2023-06-14 NOTE — Therapy (Signed)
OUTPATIENT PHYSICAL THERAPY TREATMENT  Patient Name: Christian Hughes MRN: [id number] DOB:1961/02/12, 62 y.o., male Today's Date: 06/14/2023   END OF SESSION:   PT End of Session - 06/14/23 1649     Visit Number 44    Number of Visits 50    Date for PT Re-Evaluation 02/28/23    Authorization Type Workers Compensation    Progress Note Due on Visit 50    PT Start Time 1645    PT Stop Time 1725    PT Time Calculation (min) 40 min    Equipment Utilized During Treatment Gait belt    Activity Tolerance Patient tolerated treatment well;Patient limited by pain    Behavior During Therapy WFL for tasks assessed/performed;Impulsive             Past Medical History:  Diagnosis Date   Engages in vaping    H/O ventral hernia    S/p of repair   Hypertension Early 2010's   Marijuana use    Orthostatic dizziness 10/08/2022   Past Surgical History:  Procedure Laterality Date   HIP ARTHROPLASTY Left 10/07/2022   Procedure: ARTHROPLASTY BIPOLAR HIP (HEMIARTHROPLASTY);  Surgeon: Juanell Fairly, MD;  Location: [facility] ORS;  Service: Orthopedics;  Laterality: Left;   JOINT REPLACEMENT  10/06/2022   Left Hip Replacement due fall while at work   Ventral hernia repair     Patient Active Problem List   Diagnosis Date Noted   Asymptomatic hypertensive urgency 04/05/2023   Osteopenia determined by x-ray 02/12/2023   Hyperlipidemia 01/10/2023   Prediabetes 01/10/2023   Primary hypertension 01/10/2023   Left ventricular hypertrophy 01/10/2023   Abnormal EKG 10/06/2022   Bone lesion_sclerotic lesions in the right iliac bone 10/06/2022    PCP: Dewaine Oats MD  REFERRING PROVIDER: Juanell Fairly MD   REFERRING DIAG: [phone] (ICD-10-CM) - Presence of left artificial hip joint   THERAPY DIAG:  History of left hip hemiarthroplasty  Pain in left hip  Weakness of left leg  Balance problems  Difficulty in walking, not elsewhere classified  Rationale for Evaluation and Treatment  Rehabilitation   Pt doing   PERTINENT HISTORY: From initial eval: Christian Hughes is a 62 y.o. male without significant past medical history except for occasional vaping amd marijuana use, who presents with fall and left hip pain. Pt states that he slipped and fell at work at about 11:00 AM.  Denies loss of consciousness.  He injured his left hip, causing left hip pain, which is constant, sharp, severe, nonradiating, aggravated by movement.  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    "My mom took care of me after my hip surgery. I had therapy at home. I returned to my home alone after my first follow up appointment. I have pain in my buttocks, groin and the corner of my buttocks of about 2 to 3/10. I have high pain tolerance. I am unable to walk normal and I feel weak. I began to use this cane on my own after my PT at home stopped.  I can't wait to go to work because, I love to work."   PAIN:  Are you having pain? 5/10 today, extra aggravated with the cold, rainy weather.    WEIGHT BEARING RESTRICTIONS: Yes:  WBAT   FALLS:  Has patient fallen in last 6 months? Yes. 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 Pt negotiates some small staircases in town. Pt has historically taken 24-packs of water bottles to his work for customers.    PLOF: Independent   PATIENT GOALS: "I want to return to work without hurting and falling as soon as I can."   PRECAUTIONS: Posterior hip precautions  OBJECTIVE: (objective measures completed  at initial evaluation unless otherwise dated)   TODAY'S TREATMENT:                                                                                                                              DATE: 06/14/2023  -Nu-Step L3. Seat at 12 to maintain posterior hip flexion precautions. 5 minutes, for warm up -Half lunge stretch on stairs 2x30sec bilat  -heel raises x20  -side stepping in // bars hands free, 5lb AW bilat (quick and fast)  (2 lengths bilat)  -STS from chair + airex hands free x10  -heel raises x20  -side stepping in // bars hands free, 5lb AW bilat (quick and fast)  (2 lengths bilat)   PATIENT EDUCATION:  Education details: see above for patient education details Person educated: Patient Education method: Explanation and Demonstration Education comprehension: verbalized understanding   HOME EXERCISE PROGRAM: Access Code: [email or web address] URL: [email or web address] Date: 04/18/2023 Prepared by: Consuela Mimes  Exercises - Sit to Stand  - 1 x daily - 7 x weekly - 2 sets - 10 reps - Supine Bridge with Resistance Band  - 1 x daily - 7 x weekly - 2 sets - 10 reps - Sidelying Hip Abduction  - 1 x daily - 7 x weekly - 2 sets - 10 reps - Prone Hip Extension  - 1 x daily - 7 x weekly - 2 sets - 10 reps - Supine Hip Flexion with Resistance Loop  - 1 x daily - 7 x weekly - 2 sets - 10 reps - Prone Femoral Nerve Mobilization  - 2 x daily - 7 x weekly - 2 sets - 10 reps - 1sec hold     ASSESSMENT:   CLINICAL IMPRESSION: We continue to advance interventions with progression- moving toward LT goals of treatment. Pain is aggravated on arrival which makes many activities worse tolerated than what is typical. Pt takes additional breaks and takes and resistance/reps are reduced to allow appropriate tolerance.  Patient will benefit from continued skilled therapeutic intervention to address the remaining deficits in hip strength, mobility, and weightbearing tolerance as needed for  improved function and ability to perform work duties.      OBJECTIVE IMPAIRMENTS: Abnormal gait, decreased activity tolerance, decreased balance, decreased endurance, decreased  knowledge of condition, decreased knowledge of use of DME, decreased mobility, difficulty walking, decreased ROM, decreased strength, and dizziness.    ACTIVITY LIMITATIONS: carrying, lifting, bending, sitting, standing, squatting, sleeping, stairs, transfers, dressing, and locomotion level   PARTICIPATION LIMITATIONS: cleaning, shopping, community activity, occupation, and yard work   PERSONAL FACTORS:  Pt is impulsive and eager to get better so that he can return to work.   are also affecting patient's functional outcome.    REHAB POTENTIAL: Good   CLINICAL DECISION MAKING: Stable/uncomplicated   EVALUATION COMPLEXITY: Low     GOALS: Goals reviewed with patient? Yes   SHORT TERM GOALS: Target date: 12/22/2022 Pt will improve L hip ROM to 10 to 90 degrees without pain in order to promote normal gait and stability.  Baseline: 20 degree Hip felx and 0 deg Ext 12/04/22: 90 deg hip flex and 5 deg EXT 12/18/22: 90 deg hip flex and 10 deg hip EXT Goal status: ACHIEVED    2.  Pt will be able to ambulate 539ft safely with Cane without fall and pain . Baseline: 30 ft with SBA and heavy reliance on [facility].   12/04/22: Pt uses SPC for primary means of mobility in home and community at this time.  Goal status: ACHIEVED       LONG TERM GOALS: Target date: 01/26/2023   Pt will Improve TUG time to <10secs without SPC to demonstrate decreased fall risk.  Baseline: 19 secs with SPC 12/04/22: 14.6 seconds with SPC 12/18/22: 9.5 sec Goal status: ACHIEVED   2.  Pt will Improve LEFS score to >65/80 to demonstrate improved QoL Baseline: 18/80 12/04/22: 20/80  12/18/22: 22/80 01/29/23: 25/80  03/15/23: 24/80 04/09/23: 27/80  04/30/23: 26/80 05/16/23:  19/80 Goal status: NOT MET   3.  Pt will be able to ambulate Independently  with  LRAD without LOB and pain in order to return to community level mobility safely  Baseline: With [facility] and [facility].   12/04/22: ModI ambulation with SPC/LRAD with mild pain at longer distances, no LOB 12/18/22: Amb IND with SPC with no LOB Goal status: ACHIEVED    4.  Pt will score >50 on FOTO Baseline: 36/56 12/04/22: 35/56 12/18/22: 37/56 01/29/23: 43/56 03/15/23: 38/56 04/09/23: 43/56 04/30/23: 41/56 05/16/23:  38/56 Goal status: NOT MET   5.  Pt will demonstrate 4/5 L hip strength without pain to improve stability and safety with all functional mobility in order to return to work.  Baseline: 2/5 grossly    12/04/22: 4-/5 grossly 12/18/22: 4-/5 grossly 01/29/23: 4/5 for hip flexors/quads/HS, 3+/5 for hip ABD and EXT.  03/15/23: Remaining weakness in hip flexors, quads, abductors, extensors.  04/09/23: Improved strength with pt up to at least 4/5, pain with loading hip flexors and abductors  04/30/23: Weak hip abduction and extension MMT with heightened pain; good strength otherwise 05/16/23: Weak hip flexion, abduction, extension; weak quads; pain with hip flexor and gluteal MMTs Goal status: NOT MET        PLAN:   PT FREQUENCY: 2x/week   PT DURATION: 4 weeks   PLANNED INTERVENTIONS: Therapeutic exercises, Therapeutic activity, Neuromuscular re-education, Balance training, Gait training, Patient/Family education, Self Care, and Joint mobilization   PLAN FOR NEXT SESSION: Continue with strategies for pain modulation prn for L quad/hip flexor region and progress trunk stabilization and hip strengthening as able (focus on hip abductor/extensor strengthening); progressive weightbearing activity as tolerated.     4:53 PM, 06/14/23 Rosamaria Lints, PT, DPT Physical Therapist - [facility] in [place]  [phone] (Office)

## 2023-06-19 ENCOUNTER — Ambulatory Visit: Payer: Worker's Compensation | Admitting: Physical Therapy

## 2023-06-19 VITALS — BP 100/69

## 2023-06-19 DIAGNOSIS — Z96642 Presence of left artificial hip joint: Secondary | ICD-10-CM | POA: Diagnosis not present

## 2023-06-19 DIAGNOSIS — M25552 Pain in left hip: Secondary | ICD-10-CM

## 2023-06-19 DIAGNOSIS — R29898 Other symptoms and signs involving the musculoskeletal system: Secondary | ICD-10-CM

## 2023-06-19 NOTE — Therapy (Signed)
OUTPATIENT PHYSICAL THERAPY TREATMENT  Patient Name: Christian Hughes MRN: [id number] DOB:05/31/1961, 62 y.o., male Today's Date: 06/19/2023   END OF SESSION:   PT End of Session - 06/19/23 1436     Visit Number 45    Number of Visits 50    Date for PT Re-Evaluation 02/28/23    Authorization Type Workers Compensation    Progress Note Due on Visit 50    PT Start Time 1431    PT Stop Time 1513    PT Time Calculation (min) 42 min    Equipment Utilized During Treatment Gait belt    Activity Tolerance Patient tolerated treatment well;Patient limited by pain    Behavior During Therapy WFL for tasks assessed/performed;Impulsive              Past Medical History:  Diagnosis Date   Engages in vaping    H/O ventral hernia    S/p of repair   Hypertension Early 2010's   Marijuana use    Orthostatic dizziness 10/08/2022   Past Surgical History:  Procedure Laterality Date   HIP ARTHROPLASTY Left 10/07/2022   Procedure: ARTHROPLASTY BIPOLAR HIP (HEMIARTHROPLASTY);  Surgeon: Juanell Fairly, MD;  Location: [facility] ORS;  Service: Orthopedics;  Laterality: Left;   JOINT REPLACEMENT  10/06/2022   Left Hip Replacement due fall while at work   Ventral hernia repair     Patient Active Problem List   Diagnosis Date Noted   Asymptomatic hypertensive urgency 04/05/2023   Osteopenia determined by x-ray 02/12/2023   Hyperlipidemia 01/10/2023   Prediabetes 01/10/2023   Primary hypertension 01/10/2023   Left ventricular hypertrophy 01/10/2023   Abnormal EKG 10/06/2022   Bone lesion_sclerotic lesions in the right iliac bone 10/06/2022    PCP: Dewaine Oats MD  REFERRING PROVIDER: Juanell Fairly MD   REFERRING DIAG: [phone] (ICD-10-CM) - Presence of left artificial hip joint   THERAPY DIAG:  History of left hip hemiarthroplasty  Pain in left hip  Weakness of left leg  Rationale for Evaluation and Treatment Rehabilitation  PERTINENT HISTORY: From initial eval: Christian Hughes is a  62 y.o. male without significant past medical history except for occasional vaping amd marijuana use, who presents with fall and left hip pain.  Pt states that he slipped and fell at work at about 11:00 AM.  Denies loss of consciousness.  He injured his left hip, causing left hip pain, which is constant, sharp, severe, nonradiating, aggravated by movement.  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    "My mom took care of me after my hip surgery. I had therapy at home. I returned to my home alone after my first follow up appointment. I have pain in my buttocks, groin and the corner of my buttocks of about 2 to 3/10. I have high pain tolerance. I am unable to walk normal and I feel weak. I began to use this cane on my own after my PT at home stopped.  I can't wait to go to work because, I love to work."   PAIN:  Are you having pain? Yes: Pain location: 2-3/10 Pain description: stabbing Aggravating factors: Moving, walking, WB   Relieving factors: rest, OTC meds, ice    WEIGHT BEARING RESTRICTIONS: Yes:  WBAT   FALLS:  Has patient fallen in last 6 months? Yes. Number of falls once once 3 months ago   LIVING ENVIRONMENT:  Lives with: lives alone Lives in: Mobile home Stairs: Yes: External: 4 steps; can reach both Has following equipment at home: Single point cane   OCCUPATION: Works in the office for [employer]. 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 Pt negotiates some small staircases in town. Pt has historically taken 24-packs of water bottles to his work for customers.    PLOF: Independent   PATIENT GOALS: "I want to return to work without hurting and falling as soon as I can."   PRECAUTIONS: Posterior hip precautions      OBJECTIVE: (objective measures completed  at initial evaluation unless otherwise dated)  PALPATION: Pain in L sacral border, Iliac crest, gluteus medius, TFL, and greater trochanter     LOWER EXTREMITY ROM:    Active ROM Right eval Left eval  Hip flexion WFL 20 deg with pain  Hip extension [facility]    Hip abduction WFL 20 with pain   Hip adduction [facility]    Hip internal rotation [facility]    Hip external rotation [facility]    Knee flexion [facility]  Knee extension [facility]  Ankle dorsiflexion [facility]  Ankle plantarflexion [facility]  Ankle inversion [facility]  Ankle eversion WFL WFL   (Blank rows = not tested)   LOWER EXTREMITY MMT:   MMT Right eval Left eval Left 12/04/22 Left 12/18/22 Left 01/29/23 Left 03/15/23 Left 04/11/23 Left 04/30/23 Left 05/16/23  Hip flexion WFL 2+ 4* 4- 4 4* 4* 4+ 4*  Hip extension WFL 2 3+  3+* 3+* 4 3+* 4-*  Hip abduction WFl 2 4-  3+* 4* 4* 3+* 3+*  Hip adduction WFl           Hip internal rotation [facility]           Hip external rotation [facility]           Knee flexion WFL 3- 3+ 4- 4 4+* 5 5 4+  Knee extension WFl 3 4+ 4* 4* 4-* 4+ 5 4*  Ankle dorsiflexion WFL 3+         Ankle plantarflexion WFL 4         Ankle inversion WFL 4         Ankle eversion WFL 3-          (Blank rows = not tested) Pt's L ankle remains inverted 10 degrees since the Surgery.     FUNCTIONAL TESTS (INITIAL EVALUATION) 30 seconds chair stand test: 5 reps completed Timed up and go (TUG): 19secs 10 meter walk test: 12secs= 1,2 m/secs             Balance Test : SL standing unable, Modified tandem 10 secs, Tandem Unable, Rhomberg with EO 5 secs and unable with EC.  GAIT: Distance walked: 30 ft  Assistive device utilized: Single point cane Level of assistance: SBA Comments: Pt unsteady on feet with severe antalgic gait, decreased loading response, absent heel strike and absent toe off gait with uneven stride and decreased speed. Heavy reliance on the [facility]   -----  03/27/23  Posture: Lumbar lordosis: WNL Iliac crest height: Equal  bilaterally Lumbar lateral shift: Mild shift to R to offload LLE  AROM AROM (Normal range in degrees) AROM   Lumbar   Flexion (65) 75% (stopped at 90 deg hip flexion)  Extension (30) 50%  Right lateral flexion (25) 100%  Left lateral  flexion (25) 100%  Right rotation (30) 75%  Left rotation (30) 75%      (* = pain; Blank rows = not tested)   Sensation Grossly intact to light touch throughout bilateral LEs as determined by testing dermatomes L2-S2. Proprioception, stereognosis, and hot/cold testing deferred on this date.   Special Tests Lumbar Radiculopathy and Discogenic: Centralization and Peripheralization (SN 92, -LR 0.12): Not examined Slump (SN 83, -LR 0.32): R: Negative L: Negative SLR (SN 92, -LR 0.29): R: Negative L:  Negative  Facet Joint: Extension-Rotation (SN 100, -LR 0.0): R: Negative L: Positive  Lumbar Foraminal Stenosis: Lumbar quadrant (SN 70): R: Negative L: Positive      TODAY'S TREATMENT:                                                                                                                              DATE: 06/19/2023    L hip hemiarthroplasty following L femoral neck fracture (DOS: 10/07/22)   SUBJECTIVE STATEMENT:  Pt reports having usual baseline discomfort along L groin last visit and today. Patient reports not having substantial pain - he states he can feel sensation along groin, but he feels that it doesn't hurt at baseline. Patient reports some soreness after last visit, but no unusual response after treatment.     Today's Vitals   06/19/23 1439 06/19/23 1440  BP: 96/67 100/69       There is no height or weight on file to calculate BMI.    Therapeutic Exercise - for improved soft tissue flexibility and extensibility as needed for ROM, improved strength as needed to improve performance of CKC activities/functional movements   Nu-Step L2. Seat at 11 to maintain posterior hip flexion precautions. 5 minutes, for warm  up  Ambulate laps around gym, no AD; x 2   -focus on limiting LLE IR  -heel to toe progression emphasized  Glute bridge with single-leg knee extension, alternating;  2x8 on each LE   -10-AW across lap  Sidelying hip abduction, with 2-lb ankle weight; 2x8  Sit to stand, Airex on seat for inc seat height and Airex under feet; 2x8  Forward monster walk (forward step + abduction) with Red Tband; 5x D/B length of // bars  Standing heel raise/toe raise; 2x15 alternating   Single limb stance; multiple attempts with up to 10 sec attained on each LE, in // bars   *not today* Hurdle step over 6-inch hurdle with focus on heel strike and heel to toe progression; 2 x10 ea LE Sidestep with Blue Tband superior to patellae; 4x D/B length of // bars Cold pack (unbilled) - for anti-inflammatory and analgesic effect during e-stim as needed for reduced pain and improved ability to participate in active PT intervention, along L anterior in supine, x 8 minutes    PATIENT EDUCATION: We discussed continued work on dorsiflexor motor control at home and focusing on toe-out and heel to toe progression when walking.    *  not today* Lateral step up to 6-inch step; staircase in center of gym; 1x12  -pt has limited tolerance of CKC loading with vaulting off of LE on floor and lateral flexion to L when weight shifting to LLE Glute bridge with Green physioball under calves; 3x10 Manual prone quad stretch; 2 x 30 sec Prone quad stretch with bedsheet looped around ankle; x 30 sec  -for HEP demo  -pt has hamstring cramp when attempted gactive prone knee flexion hampering initiating of prone quad stretch Butt kicker along blue agility ladder; 2x D/B length of ladder for active quad/hip flexor mobility work and weight shifting Side steps along blue agility ladder: x2 D/B with Blue Tband looped above patellae Alternating arms and legs with abdominal brace with Green physioball; x20 alternating R/L Forward step-up  to opposite LE tap on next step; 2x10, 6-inch step (staircase center of clinic) Alternating hip flexion with Tband, in standing today; 2x8 alternating R/L with Red Tband Forward step over (3) 6-inch hurdles and (2) 12-inch hurdles, unsupported in open gym environment; 3x D/B with reciprocal patternProne hip extension, alternating; 2x10 alt; 2-lb ankle weights Butterfly stretch, technique reviewed for HEP Modified Thomas hip flexor stretch, technique reviewed for HEP Forward step up to Airex pad; 1x15 surgical LE leading  Prone knee bend, dynamic stretch; 1x10, 1 sec; for review Toe tapping; 2x10 alternating R/L with 12-inch step, no UE support; stance on Airex padThomas hip flexor stretch; reviewed Butterfly stretch in supine; reviewed Manual hip flexor stretch in R sidelying, gentle (hip to neutral); 2 x 30 sec Standing march with 5-lb ankle weights; 2 x 10 alternating, holding treadmill armrest Sit to stand: 1x6 and 1x4; from chair with 8-lb Goblet hold  Unipedal stance; 2 x 15 sec Bridge 3x12:  Black Tband at distal femurs with good carryover in form/technique.  Butterfly stretch; 3 x 20 sec        PATIENT EDUCATION:  Education details: see above for patient education details Person educated: Patient Education method: Explanation and Demonstration Education comprehension: verbalized understanding   HOME EXERCISE PROGRAM: Access Code: [email or web address] URL: [email or web address] Date: 04/18/2023 Prepared by: Consuela Mimes  Exercises - Sit to Stand  - 1 x daily - 7 x weekly - 2 sets - 10 reps - Supine Bridge with Resistance Band  - 1 x daily - 7 x weekly - 2 sets - 10 reps - Sidelying Hip Abduction  - 1 x daily - 7 x weekly - 2 sets - 10 reps - Prone Hip Extension  - 1 x daily - 7 x weekly - 2 sets - 10 reps - Supine Hip Flexion with Resistance Loop  - 1 x daily - 7 x weekly - 2 sets - 10 reps - Prone Femoral Nerve Mobilization  - 2 x daily - 7 x weekly - 2 sets - 10  reps - 1sec hold     ASSESSMENT:   CLINICAL IMPRESSION:  Patient exhibits improving quality of gait with decreased toe-in and improved compensated Trendelenburg. We are able to modestly increase intensity of hip strengthening drills and added unipedal standing to promote gross LE kinetic chain stability and tolerance to weightbearing on surgical lower limb without heavy impact. Pt has remaining deficits in hip flexor, quadriceps, and adductor flexibility/extensibility; hip ABD/EXT/flexor strength, weightbearing tolerance, gait deviations (L hip IR, toe-in, dec eccentric control of L dorsiflexors), and limitations with negotiating steps/curbs/uneven terrain. Patient will benefit from continued skilled therapeutic intervention to address the remaining deficits in hip strength, mobility, and  weightbearing tolerance as needed for improved function and ability to perform work duties.      OBJECTIVE IMPAIRMENTS: Abnormal gait, decreased activity tolerance, decreased balance, decreased endurance, decreased knowledge of condition, decreased knowledge of use of DME, decreased mobility, difficulty walking, decreased ROM, decreased strength, and dizziness.    ACTIVITY LIMITATIONS: carrying, lifting, bending, sitting, standing, squatting, sleeping, stairs, transfers, dressing, and locomotion level   PARTICIPATION LIMITATIONS: cleaning, shopping, community activity, occupation, and yard work   PERSONAL FACTORS:  Pt is impulsive and eager to get better so that he can return to work.   are also affecting patient's functional outcome.    REHAB POTENTIAL: Good   CLINICAL DECISION MAKING: Stable/uncomplicated   EVALUATION COMPLEXITY: Low     GOALS: Goals reviewed with patient? Yes   SHORT TERM GOALS: Target date: 12/22/2022 Pt will improve L hip ROM to 10 to 90 degrees without pain in order to promote normal gait and stability.  Baseline: 20 degree Hip felx and 0 deg Ext 12/04/22: 90 deg hip flex and 5  deg EXT 12/18/22: 90 deg hip flex and 10 deg hip EXT Goal status: ACHIEVED    2.  Pt will be able to ambulate 599ft safely with Cane without fall and pain . Baseline: 30 ft with SBA and heavy reliance on [facility].   12/04/22: Pt uses SPC for primary means of mobility in home and community at this time.  Goal status: ACHIEVED       LONG TERM GOALS: Target date: 01/26/2023   Pt will Improve TUG time to <10secs without SPC to demonstrate decreased fall risk.  Baseline: 19 secs with SPC 12/04/22: 14.6 seconds with SPC 12/18/22: 9.5 sec Goal status: ACHIEVED   2.  Pt will Improve LEFS score to >65/80 to demonstrate improved QoL Baseline: 18/80 12/04/22: 20/80  12/18/22: 22/80 01/29/23: 25/80  03/15/23: 24/80 04/09/23: 27/80  04/30/23: 26/80 05/16/23:  19/80 Goal status: NOT MET   3.  Pt will be able to ambulate Independently  with LRAD without LOB and pain in order to return to community level mobility safely  Baseline: With [facility] and [facility].   12/04/22: ModI ambulation with SPC/LRAD with mild pain at longer distances, no LOB 12/18/22: Amb IND with SPC with no LOB Goal status: ACHIEVED    4.  Pt will score >50 on FOTO Baseline: 36/56 12/04/22: 35/56 12/18/22: 37/56 01/29/23: 43/56 03/15/23: 38/56 04/09/23: 43/56 04/30/23: 41/56 05/16/23:  38/56 Goal status: NOT MET   5.  Pt will demonstrate 4/5 L hip strength without pain to improve stability and safety with all functional mobility in order to return to work.  Baseline: 2/5 grossly    12/04/22: 4-/5 grossly 12/18/22: 4-/5 grossly 01/29/23: 4/5 for hip flexors/quads/HS, 3+/5 for hip ABD and EXT.  03/15/23: Remaining weakness in hip flexors, quads, abductors, extensors.  04/09/23: Improved strength with pt up to at least 4/5, pain with loading hip flexors and abductors  04/30/23: Weak hip abduction and extension MMT with heightened pain; good strength otherwise 05/16/23: Weak hip flexion, abduction, extension; weak quads; pain with hip flexor and gluteal MMTs Goal  status: NOT MET        PLAN:   PT FREQUENCY: 2x/week   PT DURATION: 4 weeks   PLANNED INTERVENTIONS: Therapeutic exercises, Therapeutic activity, Neuromuscular re-education, Balance training, Gait training, Patient/Family education, Self Care, and Joint mobilization   PLAN FOR NEXT SESSION: Continue with strategies for pain modulation prn for L quad/hip flexor region and progress trunk stabilization and  hip strengthening as able (focus on hip abductor/extensor strengthening); progressive weightbearing activity as tolerated.     Consuela Mimes, PT, DPT [phone] Physical Therapist- [facility]  06/19/2023, 3:17 PM

## 2023-06-21 ENCOUNTER — Ambulatory Visit: Payer: Worker's Compensation | Admitting: Physical Therapy

## 2023-06-21 VITALS — BP 137/88 | HR 78

## 2023-06-21 DIAGNOSIS — Z96642 Presence of left artificial hip joint: Secondary | ICD-10-CM | POA: Diagnosis not present

## 2023-06-21 DIAGNOSIS — M25552 Pain in left hip: Secondary | ICD-10-CM

## 2023-06-21 DIAGNOSIS — R29898 Other symptoms and signs involving the musculoskeletal system: Secondary | ICD-10-CM

## 2023-06-21 DIAGNOSIS — R2689 Other abnormalities of gait and mobility: Secondary | ICD-10-CM

## 2023-06-21 DIAGNOSIS — R262 Difficulty in walking, not elsewhere classified: Secondary | ICD-10-CM

## 2023-06-21 NOTE — Therapy (Signed)
OUTPATIENT PHYSICAL THERAPY TREATMENT  Patient Name: Christian Hughes MRN: [id number] DOB:April 10, 1961, 62 y.o., male Today's Date: 06/21/2023   END OF SESSION:   PT End of Session - 06/21/23 1436     Visit Number 46    Number of Visits 50    Date for PT Re-Evaluation 02/28/23    Authorization Type Workers Compensation    Progress Note Due on Visit 50    PT Start Time 1429    PT Stop Time 1510    PT Time Calculation (min) 41 min    Equipment Utilized During Treatment Gait belt    Activity Tolerance Patient tolerated treatment well;Patient limited by pain    Behavior During Therapy WFL for tasks assessed/performed;Impulsive             Past Medical History:  Diagnosis Date   Engages in vaping    H/O ventral hernia    S/p of repair   Hypertension Early 2010's   Marijuana use    Orthostatic dizziness 10/08/2022   Past Surgical History:  Procedure Laterality Date   HIP ARTHROPLASTY Left 10/07/2022   Procedure: ARTHROPLASTY BIPOLAR HIP (HEMIARTHROPLASTY);  Surgeon: Juanell Fairly, MD;  Location: [facility] ORS;  Service: Orthopedics;  Laterality: Left;   JOINT REPLACEMENT  10/06/2022   Left Hip Replacement due fall while at work   Ventral hernia repair     Patient Active Problem List   Diagnosis Date Noted   Asymptomatic hypertensive urgency 04/05/2023   Osteopenia determined by x-ray 02/12/2023   Hyperlipidemia 01/10/2023   Prediabetes 01/10/2023   Primary hypertension 01/10/2023   Left ventricular hypertrophy 01/10/2023   Abnormal EKG 10/06/2022   Bone lesion_sclerotic lesions in the right iliac bone 10/06/2022    PCP: Dewaine Oats MD  REFERRING PROVIDER: Juanell Fairly MD   REFERRING DIAG: [phone] (ICD-10-CM) - Presence of left artificial hip joint   THERAPY DIAG:  History of left hip hemiarthroplasty  Pain in left hip  Weakness of left leg  Balance problems  Difficulty in walking, not elsewhere classified  Rationale for Evaluation and Treatment  Rehabilitation  PERTINENT HISTORY: From initial eval: Christian Hughes is a 62 y.o. male without significant past medical history except for occasional vaping amd marijuana use, who presents with fall and left hip pain.  Pt states that he slipped and fell at work at about 11:00 AM.  Denies loss of consciousness.  He injured his left hip, causing left hip pain, which is constant, sharp, severe, nonradiating, aggravated by movement.  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    "My mom took care of me after my hip surgery. I had therapy at home. I returned to my home alone after my first follow up appointment. I have pain in my buttocks, groin and the corner of my buttocks of about 2 to 3/10. I have high pain tolerance. I am unable to walk normal and I feel weak. I began to use this cane on my own after my PT at home stopped.  I can't wait to go to work because, I love to work."   PAIN:  Are you having pain? Yes: Pain location: 2-3/10 Pain description: stabbing Aggravating factors: Moving, walking, WB   Relieving factors: rest, OTC meds, ice    WEIGHT BEARING RESTRICTIONS: Yes:  WBAT   FALLS:  Has patient fallen in last 6 months? Yes. 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 Pt negotiates some small staircases in town. Pt has historically taken 24-packs of water bottles to his work for customers.    PLOF: Independent   PATIENT GOALS: "I want to return to work without hurting and falling as soon as I can."   PRECAUTIONS:  Posterior hip precautions      OBJECTIVE: (objective measures completed at initial evaluation unless otherwise dated)  PALPATION: Pain in L sacral border, Iliac crest, gluteus medius, TFL, and greater trochanter     LOWER EXTREMITY ROM:    Active ROM Right eval Left eval  Hip flexion WFL 20 deg with pain  Hip extension [facility]    Hip abduction WFL 20 with pain   Hip adduction [facility]    Hip internal rotation [facility]    Hip external rotation [facility]    Knee flexion [facility]  Knee extension [facility]  Ankle dorsiflexion [facility]  Ankle plantarflexion [facility]  Ankle inversion [facility]  Ankle eversion WFL WFL   (Blank rows = not tested)   LOWER EXTREMITY MMT:   MMT Right eval Left eval Left 12/04/22 Left 12/18/22 Left 01/29/23 Left 03/15/23 Left 04/11/23 Left 04/30/23 Left 05/16/23  Hip flexion WFL 2+ 4* 4- 4 4* 4* 4+ 4*  Hip extension WFL 2 3+  3+* 3+* 4 3+* 4-*  Hip abduction WFl 2 4-  3+* 4* 4* 3+* 3+*  Hip adduction WFl           Hip internal rotation [facility]           Hip external rotation [facility]           Knee flexion WFL 3- 3+ 4- 4 4+* 5 5 4+  Knee extension WFl 3 4+ 4* 4* 4-* 4+ 5 4*  Ankle dorsiflexion WFL 3+         Ankle plantarflexion WFL 4         Ankle inversion WFL 4         Ankle eversion WFL 3-          (Blank rows = not tested) Pt's L ankle remains inverted 10 degrees since the Surgery.     FUNCTIONAL TESTS (INITIAL EVALUATION) 30 seconds chair stand test: 5 reps completed Timed up and go (TUG): 19secs 10 meter walk test: 12secs= 1,2 m/secs             Balance Test : SL standing unable, Modified tandem 10 secs, Tandem Unable, Rhomberg with EO 5 secs and unable with EC.  GAIT: Distance walked: 30 ft  Assistive device utilized: Single point cane Level of assistance: SBA Comments: Pt unsteady on feet with severe antalgic gait, decreased loading response, absent heel strike and absent toe off gait with uneven stride and decreased speed. Heavy reliance on the [facility]    -----  03/27/23  Posture: Lumbar lordosis: WNL Iliac crest height: Equal bilaterally Lumbar lateral shift: Mild shift to R to offload LLE  AROM AROM (Normal range in degrees) AROM   Lumbar   Flexion (65) 75% (stopped at 90 deg hip flexion)  Extension (30) 50%  Right lateral flexion (25) 100%  Left lateral flexion (25) 100%  Right rotation (30) 75%  Left rotation (30) 75%      (* = pain; Blank rows = not tested)   Sensation Grossly intact to light touch throughout bilateral LEs as determined by testing dermatomes L2-S2. Proprioception, stereognosis, and hot/cold testing deferred on this date.   Special Tests Lumbar Radiculopathy and Discogenic: Centralization and Peripheralization (SN 92, -LR 0.12): Not examined Slump (SN 83, -LR 0.32): R: Negative L: Negative SLR (SN 92, -LR 0.29): R: Negative L:  Negative  Facet Joint: Extension-Rotation (SN 100, -LR 0.0): R: Negative L: Positive  Lumbar Foraminal Stenosis: Lumbar quadrant (SN 70): R: Negative L: Positive      TODAY'S TREATMENT:                                                                                                                              DATE: 06/21/2023    L hip hemiarthroplasty following L femoral neck fracture (DOS: 10/07/22)   SUBJECTIVE STATEMENT:  Pt reports severe groin pain when first stepping out this AM. He reports his "normal" 2-3/10 pain in groin at arrival. Patient reports compliance with HEP. Patient reports that he is working on his gait at work, but he is still "pigeon-toed" on his operative side.     Today's Vitals   06/21/23 1433 06/21/23 1434  BP: (!) 150/94 137/88  Pulse: 73 78        There is no height or weight on file to calculate BMI.    Therapeutic Exercise - for improved soft tissue flexibility and extensibility as needed for ROM, improved strength as needed to improve performance of CKC activities/functional movements   Nu-Step L3. Seat at 12 to maintain  posterior hip flexion precautions. 5 minutes, for warm up  Ambulate laps around gym, no AD; x 2   -versus opposite LE, moderate foot slap/decreased eccentric control of dorsiflexors L versus R  -Mild sidebend with stance phase on bilat LE  Glute bridge with single-leg knee extension, alternating;  2x8 on each LE   -10-AW across lap  Sidelying hip abduction, with 2-lb ankle weight; 2x8  Sit to stand, Airex on seat for inc seat height and Airex under feet; 2x8  Forward monster walk (forward step + abduction) with Red Tband; 5x D/B length of // bars  Heel walk , in // bars; x2 D/B; limited ability to maintain active dorsiflexion with stepping in bars  Semitandem stance on Airex; 2 x 20 sec each position   *not today* Standing heel raise/toe raise; 2x15 alternating  Single limb stance; multiple attempts with up to 10 sec attained on each LE, in // bars Hurdle step over 6-inch hurdle with focus on heel strike and heel to toe progression; 2 x10 ea LE Sidestep with Blue Tband superior to patellae; 4x D/B length of // bars Cold pack (unbilled) - for anti-inflammatory and analgesic effect during e-stim as needed  for reduced pain and improved ability to participate in active PT intervention, along L anterior in supine, x 8 minutes    PATIENT EDUCATION: We discussed continued work on dorsiflexor motor control at home and focusing on toe-out and heel to toe progression when walking.    *not today* Lateral step up to 6-inch step; staircase in center of gym; 1x12  -pt has limited tolerance of CKC loading with vaulting off of LE on floor and lateral flexion to L when weight shifting to LLE Glute bridge with Green physioball under calves; 3x10 Manual prone quad stretch; 2 x 30 sec Prone quad stretch with bedsheet looped around ankle; x 30 sec  -for HEP demo  -pt has hamstring cramp when attempted gactive prone knee flexion hampering initiating of prone quad stretch Butt kicker along blue  agility ladder; 2x D/B length of ladder for active quad/hip flexor mobility work and weight shifting Side steps along blue agility ladder: x2 D/B with Blue Tband looped above patellae Alternating arms and legs with abdominal brace with Green physioball; x20 alternating R/L Forward step-up to opposite LE tap on next step; 2x10, 6-inch step (staircase center of clinic) Alternating hip flexion with Tband, in standing today; 2x8 alternating R/L with Red Tband Forward step over (3) 6-inch hurdles and (2) 12-inch hurdles, unsupported in open gym environment; 3x D/B with reciprocal patternProne hip extension, alternating; 2x10 alt; 2-lb ankle weights Butterfly stretch, technique reviewed for HEP Modified Thomas hip flexor stretch, technique reviewed for HEP Forward step up to Airex pad; 1x15 surgical LE leading  Prone knee bend, dynamic stretch; 1x10, 1 sec; for review Toe tapping; 2x10 alternating R/L with 12-inch step, no UE support; stance on Airex padThomas hip flexor stretch; reviewed Butterfly stretch in supine; reviewed Manual hip flexor stretch in R sidelying, gentle (hip to neutral); 2 x 30 sec Standing march with 5-lb ankle weights; 2 x 10 alternating, holding treadmill armrest Sit to stand: 1x6 and 1x4; from chair with 8-lb Goblet hold  Unipedal stance; 2 x 15 sec Bridge 3x12:  Black Tband at distal femurs with good carryover in form/technique.  Butterfly stretch; 3 x 20 sec        PATIENT EDUCATION:  Education details: see above for patient education details Person educated: Patient Education method: Explanation and Demonstration Education comprehension: verbalized understanding   HOME EXERCISE PROGRAM: Access Code: [email or web address] URL: [email or web address] Date: 04/18/2023 Prepared by: Consuela Mimes  Exercises - Sit to Stand  - 1 x daily - 7 x weekly - 2 sets - 10 reps - Supine Bridge with Resistance Band  - 1 x daily - 7 x weekly - 2 sets - 10 reps - Sidelying  Hip Abduction  - 1 x daily - 7 x weekly - 2 sets - 10 reps - Prone Hip Extension  - 1 x daily - 7 x weekly - 2 sets - 10 reps - Supine Hip Flexion with Resistance Loop  - 1 x daily - 7 x weekly - 2 sets - 10 reps - Prone Femoral Nerve Mobilization  - 2 x daily - 7 x weekly - 2 sets - 10 reps - 1sec hold     ASSESSMENT:   CLINICAL IMPRESSION:  Patient does have subtle asymmetry with decrease eccentric control of L ankle dorsiflexors versus R side. Limitation with heel walking today that seems to be largely due to postural control versus motor strength. We continued hip strengthening and stabilization exercise in clinic today with fair tolerance of most drills;  pt is most challenged with hip abductor strengthening drills. Pt has remaining deficits in hip flexor, quadriceps, and adductor flexibility/extensibility; hip ABD/EXT/flexor strength, weightbearing tolerance, gait deviations (L hip IR, toe-in, dec eccentric control of L dorsiflexors), and limitations with negotiating steps/curbs/uneven terrain. Patient will benefit from continued skilled therapeutic intervention to address the remaining deficits in hip strength, mobility, and weightbearing tolerance as needed for improved function and ability to perform work duties.      OBJECTIVE IMPAIRMENTS: Abnormal gait, decreased activity tolerance, decreased balance, decreased endurance, decreased knowledge of condition, decreased knowledge of use of DME, decreased mobility, difficulty walking, decreased ROM, decreased strength, and dizziness.    ACTIVITY LIMITATIONS: carrying, lifting, bending, sitting, standing, squatting, sleeping, stairs, transfers, dressing, and locomotion level   PARTICIPATION LIMITATIONS: cleaning, shopping, community activity, occupation, and yard work   PERSONAL FACTORS:  Pt is impulsive and eager to get better so that he can return to work.   are also affecting patient's functional outcome.    REHAB POTENTIAL: Good    CLINICAL DECISION MAKING: Stable/uncomplicated   EVALUATION COMPLEXITY: Low     GOALS: Goals reviewed with patient? Yes   SHORT TERM GOALS: Target date: 12/22/2022 Pt will improve L hip ROM to 10 to 90 degrees without pain in order to promote normal gait and stability.  Baseline: 20 degree Hip felx and 0 deg Ext 12/04/22: 90 deg hip flex and 5 deg EXT 12/18/22: 90 deg hip flex and 10 deg hip EXT Goal status: ACHIEVED    2.  Pt will be able to ambulate 535ft safely with Cane without fall and pain . Baseline: 30 ft with SBA and heavy reliance on [facility].   12/04/22: Pt uses SPC for primary means of mobility in home and community at this time.  Goal status: ACHIEVED       LONG TERM GOALS: Target date: 01/26/2023   Pt will Improve TUG time to <10secs without SPC to demonstrate decreased fall risk.  Baseline: 19 secs with SPC 12/04/22: 14.6 seconds with SPC 12/18/22: 9.5 sec Goal status: ACHIEVED   2.  Pt will Improve LEFS score to >65/80 to demonstrate improved QoL Baseline: 18/80 12/04/22: 20/80  12/18/22: 22/80 01/29/23: 25/80  03/15/23: 24/80 04/09/23: 27/80  04/30/23: 26/80 05/16/23:  19/80 Goal status: NOT MET   3.  Pt will be able to ambulate Independently  with LRAD without LOB and pain in order to return to community level mobility safely  Baseline: With [facility] and FWW.   12/04/22: ModI ambulation with SPC/LRAD with mild pain at longer distances, no LOB 12/18/22: Amb IND with SPC with no LOB Goal status: ACHIEVED    4.  Pt will score >50 on FOTO Baseline: 36/56 12/04/22: 35/56 12/18/22: 37/56 01/29/23: 43/56 03/15/23: 38/56 04/09/23: 43/56 04/30/23: 41/56 05/16/23:  38/56 Goal status: NOT MET   5.  Pt will demonstrate 4/5 L hip strength without pain to improve stability and safety with all functional mobility in order to return to work.  Baseline: 2/5 grossly    12/04/22: 4-/5 grossly 12/18/22: 4-/5 grossly 01/29/23: 4/5 for hip flexors/quads/HS, 3+/5 for hip ABD and EXT.  03/15/23:  Remaining weakness in hip flexors, quads, abductors, extensors.  04/09/23: Improved strength with pt up to at least 4/5, pain with loading hip flexors and abductors  04/30/23: Weak hip abduction and extension MMT with heightened pain; good strength otherwise 05/16/23: Weak hip flexion, abduction, extension; weak quads; pain with hip flexor and gluteal MMTs Goal status: NOT MET  PLAN:   PT FREQUENCY: 2x/week   PT DURATION: 4 weeks   PLANNED INTERVENTIONS: Therapeutic exercises, Therapeutic activity, Neuromuscular re-education, Balance training, Gait training, Patient/Family education, Self Care, and Joint mobilization   PLAN FOR NEXT SESSION: Continue with strategies for pain modulation prn for L quad/hip flexor region and progress trunk stabilization and hip strengthening as able (focus on hip abductor/extensor strengthening); progressive weightbearing activity as tolerated.     Consuela Mimes, PT, DPT [phone] Physical Therapist- [facility]  06/21/2023, 2:36 PM

## 2023-06-26 ENCOUNTER — Encounter: Payer: Self-pay | Admitting: Physical Therapy

## 2023-06-26 ENCOUNTER — Ambulatory Visit: Payer: Worker's Compensation | Admitting: Physical Therapy

## 2023-06-26 VITALS — BP 142/96 | HR 69

## 2023-06-26 DIAGNOSIS — Z96642 Presence of left artificial hip joint: Secondary | ICD-10-CM | POA: Diagnosis not present

## 2023-06-26 DIAGNOSIS — R29898 Other symptoms and signs involving the musculoskeletal system: Secondary | ICD-10-CM

## 2023-06-26 DIAGNOSIS — R2689 Other abnormalities of gait and mobility: Secondary | ICD-10-CM

## 2023-06-26 DIAGNOSIS — M25552 Pain in left hip: Secondary | ICD-10-CM

## 2023-06-26 NOTE — Therapy (Unsigned)
OUTPATIENT PHYSICAL THERAPY TREATMENT  Patient Name: Christian Hughes MRN: [id number] DOB:[birth year], 62 y.o., male Today's Date: 06/26/2023   END OF SESSION:   PT End of Session - 06/26/23 1347     Visit Number 47    Number of Visits 50    Date for PT Re-Evaluation 02/28/23    Authorization Type Workers Compensation    Progress Note Due on Visit 50    PT Start Time 1345    PT Stop Time 1428    PT Time Calculation (min) 43 min    Equipment Utilized During Treatment Gait belt    Activity Tolerance Patient tolerated treatment well;Patient limited by pain    Behavior During Therapy WFL for tasks assessed/performed;Impulsive              Past Medical History:  Diagnosis Date   Engages in vaping    H/O ventral hernia    S/p of repair   Hypertension Early 2010's   Marijuana use    Orthostatic dizziness 10/08/2022   Past Surgical History:  Procedure Laterality Date   HIP ARTHROPLASTY Left 10/07/2022   Procedure: ARTHROPLASTY BIPOLAR HIP (HEMIARTHROPLASTY);  Surgeon: Juanell Fairly, MD;  Location: [facility] ORS;  Service: Orthopedics;  Laterality: Left;   JOINT REPLACEMENT  10/06/2022   Left Hip Replacement due fall while at work   Ventral hernia repair     Patient Active Problem List   Diagnosis Date Noted   Asymptomatic hypertensive urgency 04/05/2023   Osteopenia determined by x-ray 02/12/2023   Hyperlipidemia 01/10/2023   Prediabetes 01/10/2023   Primary hypertension 01/10/2023   Left ventricular hypertrophy 01/10/2023   Abnormal EKG 10/06/2022   Bone lesion_sclerotic lesions in the right iliac bone 10/06/2022    PCP: Dewaine Oats MD  REFERRING PROVIDER: Juanell Fairly MD   REFERRING DIAG: [phone] (ICD-10-CM) - Presence of left artificial hip joint   THERAPY DIAG:  History of left hip hemiarthroplasty  Pain in left hip  Weakness of left leg  Balance problems  Rationale for Evaluation and Treatment Rehabilitation  PERTINENT HISTORY: From initial eval:  Christian Hughes is a 62 y.o. male without significant past medical history except for occasional vaping amd marijuana use, who presents with fall and left hip pain.  Pt states that he slipped and fell at work at about 11:00 AM.  Denies loss of consciousness.  He injured his left hip, causing left hip pain, which is constant, sharp, severe, nonradiating, aggravated by movement.  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    "My mom took care of me after my hip surgery. I had therapy at home. I returned to my home alone after my first follow up appointment. I have pain in my buttocks, groin and the corner of my buttocks of about 2 to 3/10. I have high pain tolerance. I am unable to walk normal and I feel weak. I began to use this cane on my own after my PT at home stopped.  I can't wait to go to work because, I love to work."   PAIN:  Are you having pain? Yes: Pain location: 2-3/10 Pain description: stabbing Aggravating factors: Moving, walking, WB   Relieving factors: rest, OTC meds, ice    WEIGHT BEARING RESTRICTIONS: Yes:  WBAT   FALLS:  Has patient fallen in last 6 months? Yes. Number of falls once once 3 months ago  LIVING ENVIRONMENT: Lives with: lives alone Lives in: Mobile home Stairs: Yes: External: 4 steps; can reach both Has following equipment at home: Single point cane   OCCUPATION: Works in the office for [employer]. 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 Pt negotiates some small staircases in town. Pt has historically taken 24-packs of water bottles to his work for customers.    PLOF: Independent   PATIENT GOALS: "I want to return to work without hurting and falling as soon as I can."   PRECAUTIONS: Posterior hip precautions      OBJECTIVE: (objective  measures completed at initial evaluation unless otherwise dated)  PALPATION: Pain in L sacral border, Iliac crest, gluteus medius, TFL, and greater trochanter     LOWER EXTREMITY ROM:    Active ROM Right eval Left eval  Hip flexion WFL 20 deg with pain  Hip extension [facility]    Hip abduction WFL 20 with pain   Hip adduction [facility]    Hip internal rotation [facility]    Hip external rotation [facility]    Knee flexion [facility]  Knee extension [facility]  Ankle dorsiflexion [facility]  Ankle plantarflexion [facility]  Ankle inversion [facility]  Ankle eversion WFL WFL   (Blank rows = not tested)   LOWER EXTREMITY MMT:   MMT Right eval Left eval Left 12/04/22 Left 12/18/22 Left 01/29/23 Left 03/15/23 Left 04/11/23 Left 04/30/23 Left 05/16/23  Hip flexion WFL 2+ 4* 4- 4 4* 4* 4+ 4*  Hip extension WFL 2 3+  3+* 3+* 4 3+* 4-*  Hip abduction WFl 2 4-  3+* 4* 4* 3+* 3+*  Hip adduction WFl           Hip internal rotation [facility]           Hip external rotation [facility]           Knee flexion WFL 3- 3+ 4- 4 4+* 5 5 4+  Knee extension WFl 3 4+ 4* 4* 4-* 4+ 5 4*  Ankle dorsiflexion WFL 3+         Ankle plantarflexion WFL 4         Ankle inversion WFL 4         Ankle eversion WFL 3-          (Blank rows = not tested) Pt's L ankle remains inverted 10 degrees since the Surgery.     FUNCTIONAL TESTS (INITIAL EVALUATION) 30 seconds chair stand test: 5 reps completed Timed up and go (TUG): 19secs 10 meter walk test: 12secs= 1,2 m/secs             Balance Test : SL standing unable, Modified tandem 10 secs, Tandem Unable, Rhomberg with EO 5 secs and unable with EC.  GAIT: Distance walked: 30 ft  Assistive device utilized: Single point cane Level of assistance: SBA Comments: Pt unsteady on feet with severe antalgic gait, decreased loading response, absent heel strike and absent toe off gait with uneven stride and decreased speed. Heavy reliance on [facility]   -----  03/27/23  Posture: Lumbar lordosis: WNL Iliac crest  height: Equal bilaterally Lumbar lateral shift: Mild shift to R to offload LLE  AROM AROM (Normal range in degrees) AROM   Lumbar   Flexion (65) 75% (stopped at 90 deg hip flexion)  Extension (30) 50%  Right lateral flexion (25) 100%  Left lateral flexion (25) 100%  Right rotation (30) 75%  Left rotation (30) 75%      (* = pain; Blank rows = not tested)   Sensation Grossly intact to light touch throughout bilateral LEs as determined by testing dermatomes L2-S2. Proprioception, stereognosis, and hot/cold testing deferred on this date.   Special Tests Lumbar Radiculopathy and Discogenic: Centralization and Peripheralization (SN 92, -LR 0.12): Not examined Slump (SN 83, -LR 0.32): R: Negative L: Negative SLR (SN 92, -LR 0.29): R: Negative L:  Negative  Facet Joint: Extension-Rotation (SN 100, -LR 0.0): R: Negative L: Positive  Lumbar Foraminal Stenosis: Lumbar quadrant (SN 70): R: Negative L: Positive      TODAY'S TREATMENT:                                                                                                                              DATE: 06/27/2023    L hip hemiarthroplasty following L femoral neck fracture (DOS: 10/07/22)   SUBJECTIVE STATEMENT:  Pt reports notable pain this AM and pain with closing car door after getting in with accidentally closing door on lateral hip. Patient reports it is still somewhat tender there now. Patient reports 4-4.5/10 pain at arrival today. He reports discomfort with mowing his lawn at home. He is stuck in upright position in seat with hip at 90 deg flexion.     Today's Vitals   06/26/23 1348 06/26/23 1349  BP: (!) 157/92 (!) 142/96  Pulse: 69 69      There is no height or weight on file to calculate BMI.    Therapeutic Exercise - for improved soft tissue flexibility and extensibility as needed for ROM, improved strength as needed to improve performance of CKC activities/functional movements   Nu-Step L3. Seat  at 12 to maintain posterior hip flexion precautions. 5 minutes, for warm up  Butterfly stretch; 2 x 30 sec hold   Glute bridge with single-leg knee extension, alternating;  2x8 on each LE   -10-AW across lap  Hip flexor stretch, opposite foot on second step; x20, 3 sec hold, dynamic stretch;   Cold pack (unbilled) - for anti-inflammatory and analgesic effect during e-stim as needed for reduced pain and improved ability to participate in active PT intervention, along L anterior in supine, x 5 minutes   Sidelying hip abduction, with 2-lb ankle weight; 2x8  Semitandem stance on Airex; 2 x 30 sec each position  Sit to stand, Airex on seat for inc seat height and Airex under feet; 1x8 and 1x6     *not today* Forward monster walk (forward step + abduction) with Red Tband; 5x D/B length of // bars Heel walk , in // bars; x2 D/B; limited ability to maintain active dorsiflexion with stepping in bars Standing heel raise/toe raise; 2x15 alternating  Single limb stance; multiple attempts with up to 10 sec attained on each LE, in // bars Hurdle step over 6-inch hurdle with focus on  heel strike and heel to toe progression; 2 x10 ea LE Sidestep with Blue Tband superior to patellae; 4x D/B length of // bars    PATIENT EDUCATION: Discussed at length concern for persistent pain and intermittent worsening of gait deviations for 8+ months after hemiarthroplasty. I discussed plan to follow up with referring office to consider treatment alternatives needed outside of typical post-op rehab.    *not today* Lateral step up to 6-inch step; staircase in center of gym; 1x12  -pt has limited tolerance of CKC loading with vaulting off of LE on floor and lateral flexion to L when weight shifting to LLE Glute bridge with Green physioball under calves; 3x10 Manual prone quad stretch; 2 x 30 sec Prone quad stretch with bedsheet looped around ankle; x 30 sec  -for HEP demo  -pt has hamstring cramp when  attempted gactive prone knee flexion hampering initiating of prone quad stretch Butt kicker along blue agility ladder; 2x D/B length of ladder for active quad/hip flexor mobility work and weight shifting Side steps along blue agility ladder: x2 D/B with Blue Tband looped above patellae Alternating arms and legs with abdominal brace with Green physioball; x20 alternating R/L Forward step-up to opposite LE tap on next step; 2x10, 6-inch step (staircase center of clinic) Alternating hip flexion with Tband, in standing today; 2x8 alternating R/L with Red Tband Forward step over (3) 6-inch hurdles and (2) 12-inch hurdles, unsupported in open gym environment; 3x D/B with reciprocal patternProne hip extension, alternating; 2x10 alt; 2-lb ankle weights Butterfly stretch, technique reviewed for HEP Modified Thomas hip flexor stretch, technique reviewed for HEP Forward step up to Airex pad; 1x15 surgical LE leading  Prone knee bend, dynamic stretch; 1x10, 1 sec; for review Toe tapping; 2x10 alternating R/L with 12-inch step, no UE support; stance on Airex padThomas hip flexor stretch; reviewed Butterfly stretch in supine; reviewed Manual hip flexor stretch in R sidelying, gentle (hip to neutral); 2 x 30 sec Standing march with 5-lb ankle weights; 2 x 10 alternating, holding treadmill armrest Sit to stand: 1x6 and 1x4; from chair with 8-lb Goblet hold  Unipedal stance; 2 x 15 sec Bridge 3x12:  Black Tband at distal femurs with good carryover in form/technique.  Butterfly stretch; 3 x 20 sec        PATIENT EDUCATION:  Education details: see above for patient education details Person educated: Patient Education method: Explanation and Demonstration Education comprehension: verbalized understanding   HOME EXERCISE PROGRAM: Access Code: [email or web address] URL: [email or web address] Date: 04/18/2023 Prepared by: Consuela Mimes  Exercises - Sit to Stand  - 1 x daily - 7 x weekly - 2 sets -  10 reps - Supine Bridge with Resistance Band  - 1 x daily - 7 x weekly - 2 sets - 10 reps - Sidelying Hip Abduction  - 1 x daily - 7 x weekly - 2 sets - 10 reps - Prone Hip Extension  - 1 x daily - 7 x weekly - 2 sets - 10 reps - Supine Hip Flexion with Resistance Loop  - 1 x daily - 7 x weekly - 2 sets - 10 reps - Prone Femoral Nerve Mobilization  - 2 x daily - 7 x weekly - 2 sets - 10 reps - 1sec hold     ASSESSMENT:   CLINICAL IMPRESSION:  Patient is able to continue with gluteus medius and hip extensor strengthening drills this afternoon, but he does have heightened pain response toward end of his sets and with  weightbearing activity. Usual healing and rehab timeline for this post-op case was discussed with pt, and I expressed concern regarding ongoing debilitating symptoms that flare-up well beyond expected bony healing timeframe. PT will reach out to referring office to discuss ongoing pain and associated gait deviations and activity limitations as well as consideration of dry needling or other symptom modulation techniques to address chronic pain. Pt has remaining deficits in hip flexor, quadriceps, and adductor flexibility/extensibility; hip ABD/EXT/flexor strength, weightbearing tolerance, gait deviations (L hip IR, toe-in, dec eccentric control of L dorsiflexors), and limitations with negotiating steps/curbs/uneven terrain. Patient will benefit from continued skilled therapeutic intervention to address the remaining deficits in hip strength, mobility, and weightbearing tolerance as needed for improved function and ability to perform work duties.      OBJECTIVE IMPAIRMENTS: Abnormal gait, decreased activity tolerance, decreased balance, decreased endurance, decreased knowledge of condition, decreased knowledge of use of DME, decreased mobility, difficulty walking, decreased ROM, decreased strength, and dizziness.    ACTIVITY LIMITATIONS: carrying, lifting, bending, sitting, standing,  squatting, sleeping, stairs, transfers, dressing, and locomotion level   PARTICIPATION LIMITATIONS: cleaning, shopping, community activity, occupation, and yard work   PERSONAL FACTORS:  Pt is impulsive and eager to get better so that he can return to work.   are also affecting patient's functional outcome.    REHAB POTENTIAL: Good   CLINICAL DECISION MAKING: Stable/uncomplicated   EVALUATION COMPLEXITY: Low     GOALS: Goals reviewed with patient? Yes   SHORT TERM GOALS: Target date: 12/22/2022 Pt will improve L hip ROM to 10 to 90 degrees without pain in order to promote normal gait and stability.  Baseline: 20 degree Hip felx and 0 deg Ext 12/04/22: 90 deg hip flex and 5 deg EXT 12/18/22: 90 deg hip flex and 10 deg hip EXT Goal status: ACHIEVED    2.  Pt will be able to ambulate 510ft safely with Cane without fall and pain . Baseline: 30 ft with SBA and heavy reliance on [facility].   12/04/22: Pt uses SPC for primary means of mobility in home and community at this time.  Goal status: ACHIEVED       LONG TERM GOALS: Target date: 01/26/2023   Pt will Improve TUG time to <10secs without SPC to demonstrate decreased fall risk.  Baseline: 19 secs with SPC 12/04/22: 14.6 seconds with SPC 12/18/22: 9.5 sec Goal status: ACHIEVED   2.  Pt will Improve LEFS score to >65/80 to demonstrate improved QoL Baseline: 18/80 12/04/22: 20/80  12/18/22: 22/80 01/29/23: 25/80  03/15/23: 24/80 04/09/23: 27/80  04/30/23: 26/80 05/16/23:  19/80 Goal status: NOT MET   3.  Pt will be able to ambulate Independently  with LRAD without LOB and pain in order to return to community level mobility safely  Baseline: With [facility] and FWW.   12/04/22: ModI ambulation with SPC/LRAD with mild pain at longer distances, no LOB 12/18/22: Amb IND with SPC with no LOB Goal status: ACHIEVED    4.  Pt will score >50 on FOTO Baseline: 36/56 12/04/22: 35/56 12/18/22: 37/56 01/29/23: 43/56 03/15/23: 38/56 04/09/23: 43/56 04/30/23:  41/56 05/16/23:  38/56 Goal status: NOT MET   5.  Pt will demonstrate 4/5 L hip strength without pain to improve stability and safety with all functional mobility in order to return to work.  Baseline: 2/5 grossly    12/04/22: 4-/5 grossly 12/18/22: 4-/5 grossly 01/29/23: 4/5 for hip flexors/quads/HS, 3+/5 for hip ABD and EXT.  03/15/23: Remaining weakness in hip flexors,  quads, abductors, extensors.  04/09/23: Improved strength with pt up to at least 4/5, pain with loading hip flexors and abductors  04/30/23: Weak hip abduction and extension MMT with heightened pain; good strength otherwise 05/16/23: Weak hip flexion, abduction, extension; weak quads; pain with hip flexor and gluteal MMTs Goal status: NOT MET        PLAN:   PT FREQUENCY: 2x/week   PT DURATION: 4 weeks   PLANNED INTERVENTIONS: Therapeutic exercises, Therapeutic activity, Neuromuscular re-education, Balance training, Gait training, Patient/Family education, Self Care, and Joint mobilization   PLAN FOR NEXT SESSION: Continue with strategies for pain modulation prn for L quad/hip flexor region and progress trunk stabilization and hip strengthening as able (focus on hip abductor/extensor strengthening); progressive weightbearing activity as tolerated.     Consuela Mimes, PT, DPT [phone] Physical Therapist- [facility]  06/27/2023, 5:56 AM

## 2023-06-30 ENCOUNTER — Other Ambulatory Visit: Payer: Self-pay | Admitting: Physician Assistant

## 2023-06-30 DIAGNOSIS — I10 Essential (primary) hypertension: Secondary | ICD-10-CM

## 2023-07-03 ENCOUNTER — Encounter: Payer: Self-pay | Admitting: Physical Therapy

## 2023-07-03 ENCOUNTER — Ambulatory Visit: Payer: Worker's Compensation | Attending: Orthopedic Surgery | Admitting: Physical Therapy

## 2023-07-03 VITALS — BP 137/84 | HR 72

## 2023-07-03 DIAGNOSIS — R29898 Other symptoms and signs involving the musculoskeletal system: Secondary | ICD-10-CM | POA: Insufficient documentation

## 2023-07-03 DIAGNOSIS — M25552 Pain in left hip: Secondary | ICD-10-CM | POA: Diagnosis present

## 2023-07-03 DIAGNOSIS — Z96642 Presence of left artificial hip joint: Secondary | ICD-10-CM | POA: Diagnosis present

## 2023-07-03 DIAGNOSIS — R2689 Other abnormalities of gait and mobility: Secondary | ICD-10-CM | POA: Insufficient documentation

## 2023-07-03 DIAGNOSIS — R262 Difficulty in walking, not elsewhere classified: Secondary | ICD-10-CM | POA: Diagnosis present

## 2023-07-03 NOTE — Therapy (Signed)
OUTPATIENT PHYSICAL THERAPY TREATMENT  Patient Name: Christian Hughes MRN: [id number] DOB:02/17/1961, 62 y.o., male Today's Date: 07/03/2023   END OF SESSION:   PT End of Session - 07/03/23 1429     Visit Number 48    Number of Visits 50    Date for PT Re-Evaluation 02/28/23    Authorization Type Workers Compensation    Progress Note Due on Visit 50    PT Start Time 1428    PT Stop Time 1510    PT Time Calculation (min) 42 min    Equipment Utilized During Treatment Gait belt    Activity Tolerance Patient tolerated treatment well;Patient limited by pain    Behavior During Therapy WFL for tasks assessed/performed;Impulsive             Past Medical History:  Diagnosis Date   Engages in vaping    H/O ventral hernia    S/p of repair   Hypertension Early 2010's   Marijuana use    Orthostatic dizziness 10/08/2022   Past Surgical History:  Procedure Laterality Date   HIP ARTHROPLASTY Left 10/07/2022   Procedure: ARTHROPLASTY BIPOLAR HIP (HEMIARTHROPLASTY);  Surgeon: Juanell Fairly, MD;  Location: [facility] ORS;  Service: Orthopedics;  Laterality: Left;   JOINT REPLACEMENT  10/06/2022   Left Hip Replacement due fall while at work   Ventral hernia repair     Patient Active Problem List   Diagnosis Date Noted   Asymptomatic hypertensive urgency 04/05/2023   Osteopenia determined by x-ray 02/12/2023   Hyperlipidemia 01/10/2023   Prediabetes 01/10/2023   Primary hypertension 01/10/2023   Left ventricular hypertrophy 01/10/2023   Abnormal EKG 10/06/2022   Bone lesion_sclerotic lesions in the right iliac bone 10/06/2022    PCP: Dewaine Oats MD  REFERRING PROVIDER: Juanell Fairly MD   REFERRING DIAG: [phone] (ICD-10-CM) - Presence of left artificial hip joint   THERAPY DIAG:  History of left hip hemiarthroplasty  Pain in left hip  Weakness of left leg  Balance problems  Difficulty in walking, not elsewhere classified  Rationale for Evaluation and Treatment  Rehabilitation  PERTINENT HISTORY: From initial eval: Christian Hughes is a 62 y.o. male without significant past medical history except for occasional vaping amd marijuana use, who presents with fall and left hip pain.  Pt states that he slipped and fell at work at about 11:00 AM.  Denies loss of consciousness.  He injured his left hip, causing left hip pain, which is constant, sharp, severe, nonradiating, aggravated by movement.  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    "My mom took care of me after my hip surgery. I had therapy at home. I returned to my home alone after my first follow up appointment. I have pain in my buttocks, groin and the corner of my buttocks of about 2 to 3/10. I have high pain tolerance. I am unable to walk normal and I feel weak. I began to use this cane on my own after my PT at home stopped.  I can't wait to go to work because, I love to work."   PAIN:  Are you having pain? Yes: Pain location: 2-3/10 Pain description: stabbing Aggravating factors: Moving, walking, WB   Relieving factors: rest, OTC meds, ice    WEIGHT BEARING RESTRICTIONS: Yes:  WBAT   FALLS:  Has patient fallen in last 6 months? Yes. 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 Pt negotiates some small staircases in town. Pt has historically taken 24-packs of water bottles to his work for customers.    PLOF: Independent   PATIENT GOALS: "I want to return to work without hurting and falling as soon as I can."   PRECAUTIONS:  Posterior hip precautions      OBJECTIVE: (objective measures completed at initial evaluation unless otherwise dated)  PALPATION: Pain in L sacral border, Iliac crest, gluteus medius, TFL, and greater trochanter     LOWER EXTREMITY ROM:    Active ROM Right eval Left eval  Hip flexion WFL 20 deg with pain  Hip extension [facility]    Hip abduction WFL 20 with pain   Hip adduction [facility]    Hip internal rotation [facility]    Hip external rotation [facility]    Knee flexion [facility]  [facility]nee extension [facility]  Ankle dorsiflexion [facility]  Ankle plantarflexion [facility]  Ankle inversion [facility]  Ankle eversion WFL WFL   (Blank rows = not tested)   LOWER EXTREMITY MMT:   MMT Right eval Left eval Left 12/04/22 Left 12/18/22 Left 01/29/23 Left 03/15/23 Left 04/11/23 Left 04/30/23 Left 05/16/23  Hip flexion WFL 2+ 4* 4- 4 4* 4* 4+ 4*  Hip extension WFL 2 3+  3+* 3+* 4 3+* 4-*  Hip abduction WFl 2 4-  3+* 4* 4* 3+* 3+*  Hip adduction WFl           Hip internal rotation [facility]           Hip external rotation [facility]           Knee flexion WFL 3- 3+ 4- 4 4+* 5 5 4+  Knee extension WFl 3 4+ 4* 4* 4-* 4+ 5 4*  Ankle dorsiflexion WFL 3+         Ankle plantarflexion WFL 4         Ankle inversion WFL 4         Ankle eversion WFL 3-          (Blank rows = not tested) Pt's L ankle remains inverted 10 degrees since the Surgery.     FUNCTIONAL TESTS (INITIAL EVALUATION) 30 seconds chair stand test: 5 reps completed Timed up and go (TUG): 19secs 10 meter walk test: 12secs= 1,2 m/secs             Balance Test : SL standing unable, Modified tandem 10 secs, Tandem Unable, Rhomberg with EO 5 secs and unable with EC.  GAIT: Distance walked: 30 ft  Assistive device utilized: Single point cane Level of assistance: SBA Comments: Pt unsteady on feet with severe antalgic gait, decreased loading response, absent heel strike and absent toe off gait with uneven stride and decreased speed. Heavy reliance on the [facility]    -----  03/27/23  Posture: Lumbar lordosis: WNL Iliac crest height: Equal bilaterally Lumbar lateral shift: Mild shift to R to offload LLE  AROM AROM (Normal range in degrees) AROM   Lumbar   Flexion (65) 75% (stopped at 90 deg hip flexion)  Extension (30) 50%  Right lateral flexion (25) 100%  Left lateral flexion (25) 100%  Right rotation (30) 75%  Left rotation (30) 75%      (* = pain; Blank rows = not tested)   Sensation Grossly intact to light touch throughout bilateral LEs as determined by testing dermatomes L2-S2. Proprioception, stereognosis, and hot/cold testing deferred on this date.   Special Tests Lumbar Radiculopathy and Discogenic: Centralization and Peripheralization (SN 92, -LR 0.12): Not examined Slump (SN 83, -LR 0.32): R: Negative L: Negative SLR (SN 92, -LR 0.29): R: Negative L:  Negative  Facet Joint: Extension-Rotation (SN 100, -LR 0.0): R: Negative L: Positive  Lumbar Foraminal Stenosis: Lumbar quadrant (SN 70): R: Negative L: Positive      TODAY'S TREATMENT:                                                                                                                              DATE: 07/03/2023    L hip hemiarthroplasty following L femoral neck fracture (DOS: 10/07/22)   SUBJECTIVE STATEMENT:  Pt reports 1/10 pain at arrival to PT. Patient reports some pain along L plantar 1st MTP region from walking with toe-in gait. Patient reports receiving call from [facility] - they will f/u with him after PT today. Pt feels that TENS helps with hip pain management at this time. Patient reports throbbing symptoms in R 1st MTP region.     Today's Vitals   07/03/23 1430  BP: 137/84  Pulse: 72  SpO2: 98%       There is no height or weight on file to calculate BMI.    Therapeutic Exercise - for improved soft tissue flexibility and extensibility as needed for ROM, improved strength as needed to improve performance of CKC activities/functional  movements   Nu-Step L3. Seat at 12 to maintain posterior hip flexion precautions. 5 minutes, for warm up  Glute bridge with single-leg knee extension, alternating;  2x8 on each LE   -10-AW across lap  Sidelying hip abduction, with 2-lb ankle weight; 2x8  Hip flexor stretch, opposite foot on second step; x20, 3 sec hold, dynamic stretch;  Open gate, blue agility ladder; x4 D/B  Semitandem stance on Airex; 2 x 20 sec each position  Sit to stand, Airex on seat for inc seat height and Airex under feet; 2x8   PATIENT EDUCATION: discussed at-home strategies for anti-inflammatory effect/analgesic effect with ice massage or frozen bottle massage on plantar aspect of forefoot.     *not today* Butterfly stretch; 2 x 30 sec hold  Cold pack (unbilled) - for anti-inflammatory and analgesic effect during e-stim as needed for reduced pain and improved ability to participate in active PT intervention, along L anterior in supine, x 5 minutes Forward monster walk (forward step + abduction) with Red Tband; 5x D/B length of // bars Heel walk , in // bars; x2 D/B; limited ability to maintain active dorsiflexion with stepping in bars Standing heel raise/toe raise; 2x15  alternating  Single limb stance; multiple attempts with up to 10 sec attained on each LE, in // bars Hurdle step over 6-inch hurdle with focus on heel strike and heel to toe progression; 2 x10 ea LE Sidestep with Blue Tband superior to patellae; 4x D/B length of // bars      *not today* Lateral step up to 6-inch step; staircase in center of gym; 1x12  -pt has limited tolerance of CKC loading with vaulting off of LE on floor and lateral flexion to L when weight shifting to LLE Glute bridge with Green physioball under calves; 3x10 Manual prone quad stretch; 2 x 30 sec Prone quad stretch with bedsheet looped around ankle; x 30 sec  -for HEP demo  -pt has hamstring cramp when attempted gactive prone knee flexion hampering initiating  of prone quad stretch Butt kicker along blue agility ladder; 2x D/B length of ladder for active quad/hip flexor mobility work and weight shifting Side steps along blue agility ladder: x2 D/B with Blue Tband looped above patellae Alternating arms and legs with abdominal brace with Green physioball; x20 alternating R/L Forward step-up to opposite LE tap on next step; 2x10, 6-inch step (staircase center of clinic) Alternating hip flexion with Tband, in standing today; 2x8 alternating R/L with Red Tband Forward step over (3) 6-inch hurdles and (2) 12-inch hurdles, unsupported in open gym environment; 3x D/B with reciprocal patternProne hip extension, alternating; 2x10 alt; 2-lb ankle weights Butterfly stretch, technique reviewed for HEP Modified Thomas hip flexor stretch, technique reviewed for HEP Forward step up to Airex pad; 1x15 surgical LE leading  Prone knee bend, dynamic stretch; 1x10, 1 sec; for review Toe tapping; 2x10 alternating R/L with 12-inch step, no UE support; stance on Airex padThomas hip flexor stretch; reviewed Butterfly stretch in supine; reviewed Manual hip flexor stretch in R sidelying, gentle (hip to neutral); 2 x 30 sec Standing march with 5-lb ankle weights; 2 x 10 alternating, holding treadmill armrest Sit to stand: 1x6 and 1x4; from chair with 8-lb Goblet hold  Unipedal stance; 2 x 15 sec Bridge 3x12:  Black Tband at distal femurs with good carryover in form/technique.  Butterfly stretch; 3 x 20 sec        PATIENT EDUCATION:  Education details: see above for patient education details Person educated: Patient Education method: Explanation and Demonstration Education comprehension: verbalized understanding   HOME EXERCISE PROGRAM: Access Code: [email or web address] URL: [email or web address] Date: 04/18/2023 Prepared by: Consuela Mimes  Exercises - Sit to Stand  - 1 x daily - 7 x weekly - 2 sets - 10 reps - Supine Bridge with Resistance Band  - 1 x daily  - 7 x weekly - 2 sets - 10 reps - Sidelying Hip Abduction  - 1 x daily - 7 x weekly - 2 sets - 10 reps - Prone Hip Extension  - 1 x daily - 7 x weekly - 2 sets - 10 reps - Supine Hip Flexion with Resistance Loop  - 1 x daily - 7 x weekly - 2 sets - 10 reps - Prone Femoral Nerve Mobilization  - 2 x daily - 7 x weekly - 2 sets - 10 reps - 1sec hold     ASSESSMENT:   CLINICAL IMPRESSION:  Patient fortunately has low-level NPRS at arrival to PT, but he does still have some notable discomfort along groin with gluteus medius isotonics and closed-chain exercise. Patient is also limited with tolerance of sit to stand and is challenged with completing second  set today with low rep scheme. Pt to f/u with referring physician after PT today to address persistent pain following L hip hemiarthroplasty. Patient has callus formation along R 1st metatarsal head on plantar surface; we discussed simple strategies for offloading area and using ice/cryotherapy for anti-inflammatory effect. Pt will need improved gait quality for long-term solution for forefoot pain. Pt has remaining deficits in hip flexor, quadriceps, and adductor flexibility/extensibility; hip ABD/EXT/flexor strength, weightbearing tolerance, gait deviations (L hip IR, toe-in, dec eccentric control of L dorsiflexors), and limitations with negotiating steps/curbs/uneven terrain. Patient will benefit from continued skilled therapeutic intervention to address the remaining deficits in hip strength, mobility, and weightbearing tolerance as needed for improved function and ability to perform work duties.      OBJECTIVE IMPAIRMENTS: Abnormal gait, decreased activity tolerance, decreased balance, decreased endurance, decreased knowledge of condition, decreased knowledge of use of DME, decreased mobility, difficulty walking, decreased ROM, decreased strength, and dizziness.    ACTIVITY LIMITATIONS: carrying, lifting, bending, sitting, standing, squatting,  sleeping, stairs, transfers, dressing, and locomotion level   PARTICIPATION LIMITATIONS: cleaning, shopping, community activity, occupation, and yard work   PERSONAL FACTORS:  Pt is impulsive and eager to get better so that he can return to work.   are also affecting patient's functional outcome.    REHAB POTENTIAL: Good   CLINICAL DECISION MAKING: Stable/uncomplicated   EVALUATION COMPLEXITY: Low     GOALS: Goals reviewed with patient? Yes   SHORT TERM GOALS: Target date: 12/22/2022 Pt will improve L hip ROM to 10 to 90 degrees without pain in order to promote normal gait and stability.  Baseline: 20 degree Hip felx and 0 deg Ext 12/04/22: 90 deg hip flex and 5 deg EXT 12/18/22: 90 deg hip flex and 10 deg hip EXT Goal status: ACHIEVED    2.  Pt will be able to ambulate 526ft safely with Cane without fall and pain . Baseline: 30 ft with SBA and heavy reliance on [facility].   12/04/22: Pt uses SPC for primary means of mobility in home and community at this time.  Goal status: ACHIEVED       LONG TERM GOALS: Target date: 01/26/2023   Pt will Improve TUG time to <10secs without SPC to demonstrate decreased fall risk.  Baseline: 19 secs with SPC 12/04/22: 14.6 seconds with SPC 12/18/22: 9.5 sec Goal status: ACHIEVED   2.  Pt will Improve LEFS score to >65/80 to demonstrate improved QoL Baseline: 18/80 12/04/22: 20/80  12/18/22: 22/80 01/29/23: 25/80  03/15/23: 24/80 04/09/23: 27/80  04/30/23: 26/80 05/16/23:  19/80 Goal status: NOT MET   3.  Pt will be able to ambulate Independently  with LRAD without LOB and pain in order to return to community level mobility safely  Baseline: With [facility] and [facility].   12/04/22: ModI ambulation with SPC/LRAD with mild pain at longer distances, no LOB 12/18/22: Amb IND with SPC with no LOB Goal status: ACHIEVED    4.  Pt will score >50 on FOTO Baseline: 36/56 12/04/22: 35/56 12/18/22: 37/56 01/29/23: 43/56 03/15/23: 38/56 04/09/23: 43/56 04/30/23: 41/56 05/16/23:   38/56 Goal status: NOT MET   5.  Pt will demonstrate 4/5 L hip strength without pain to improve stability and safety with all functional mobility in order to return to work.  Baseline: 2/5 grossly    12/04/22: 4-/5 grossly 12/18/22: 4-/5 grossly 01/29/23: 4/5 for hip flexors/quads/HS, 3+/5 for hip ABD and EXT.  03/15/23: Remaining weakness in hip flexors, quads, abductors, extensors.  04/09/23:  Improved strength with pt up to at least 4/5, pain with loading hip flexors and abductors  04/30/23: Weak hip abduction and extension MMT with heightened pain; good strength otherwise 05/16/23: Weak hip flexion, abduction, extension; weak quads; pain with hip flexor and gluteal MMTs Goal status: NOT MET        PLAN:   PT FREQUENCY: 2x/week   PT DURATION: 4 weeks   PLANNED INTERVENTIONS: Therapeutic exercises, Therapeutic activity, Neuromuscular re-education, Balance training, Gait training, Patient/Family education, Self Care, and Joint mobilization   PLAN FOR NEXT SESSION: Continue with strategies for pain modulation prn for L quad/hip flexor region and progress trunk stabilization and hip strengthening as able (focus on hip abductor/extensor strengthening); progressive weightbearing activity as tolerated.     Consuela Mimes, PT, DPT [phone] Physical Therapist- [facility]  07/03/2023, 3:10 PM

## 2023-07-05 ENCOUNTER — Ambulatory Visit: Payer: Worker's Compensation | Admitting: Physical Therapy

## 2023-07-05 VITALS — BP 125/91 | HR 88

## 2023-07-05 DIAGNOSIS — M25552 Pain in left hip: Secondary | ICD-10-CM

## 2023-07-05 DIAGNOSIS — R2689 Other abnormalities of gait and mobility: Secondary | ICD-10-CM

## 2023-07-05 DIAGNOSIS — Z96642 Presence of left artificial hip joint: Secondary | ICD-10-CM | POA: Diagnosis not present

## 2023-07-05 DIAGNOSIS — R29898 Other symptoms and signs involving the musculoskeletal system: Secondary | ICD-10-CM

## 2023-07-05 DIAGNOSIS — R262 Difficulty in walking, not elsewhere classified: Secondary | ICD-10-CM

## 2023-07-05 NOTE — Therapy (Signed)
OUTPATIENT PHYSICAL THERAPY TREATMENT  Patient Name: Christian Hughes MRN: [id number] DOB:23-Dec-1960, 62 y.o., male Today's Date: 07/05/2023   END OF SESSION:   PT End of Session - 07/05/23 1430     Visit Number 49    Number of Visits 50    Date for PT Re-Evaluation 02/28/23    Authorization Type Workers Compensation    Progress Note Due on Visit 50    PT Start Time 1430    PT Stop Time 1511    PT Time Calculation (min) 41 min    Equipment Utilized During Treatment Gait belt    Activity Tolerance Patient tolerated treatment well;Patient limited by pain    Behavior During Therapy WFL for tasks assessed/performed;Impulsive              Past Medical History:  Diagnosis Date   Engages in vaping    H/O ventral hernia    S/p of repair   Hypertension Early 2010's   Marijuana use    Orthostatic dizziness 10/08/2022   Past Surgical History:  Procedure Laterality Date   HIP ARTHROPLASTY Left 10/07/2022   Procedure: ARTHROPLASTY BIPOLAR HIP (HEMIARTHROPLASTY);  Surgeon: Juanell Fairly, MD;  Location: [facility] ORS;  Service: Orthopedics;  Laterality: Left;   JOINT REPLACEMENT  10/06/2022   Left Hip Replacement due fall while at work   Ventral hernia repair     Patient Active Problem List   Diagnosis Date Noted   Asymptomatic hypertensive urgency 04/05/2023   Osteopenia determined by x-ray 02/12/2023   Hyperlipidemia 01/10/2023   Prediabetes 01/10/2023   Primary hypertension 01/10/2023   Left ventricular hypertrophy 01/10/2023   Abnormal EKG 10/06/2022   Bone lesion_sclerotic lesions in the right iliac bone 10/06/2022    PCP: Dewaine Oats MD  REFERRING PROVIDER: Juanell Fairly MD   REFERRING DIAG: [phone] (ICD-10-CM) - Presence of left artificial hip joint   THERAPY DIAG:  History of left hip hemiarthroplasty  Pain in left hip  Weakness of left leg  Balance problems  Difficulty in walking, not elsewhere classified  Rationale for Evaluation and Treatment  Rehabilitation  PERTINENT HISTORY: From initial eval: Christian Hughes is a 62 y.o. male without significant past medical history except for occasional vaping amd marijuana use, who presents with fall and left hip pain.  Pt states that he slipped and fell at work at about 11:00 AM.  Denies loss of consciousness.  He injured his left hip, causing left hip pain, which is constant, sharp, severe, nonradiating, aggravated by movement.  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    "My mom took care of me after my hip surgery. I had therapy at home. I returned to my home alone after my first follow up appointment. I have pain in my buttocks, groin and the corner of my buttocks of about 2 to 3/10. I have high pain tolerance. I am unable to walk normal and I feel weak. I began to use this cane on my own after my PT at home stopped.  I can't wait to go to work because, I love to work."   PAIN:  Are you having pain? Yes: Pain location: 2-3/10 Pain description: stabbing Aggravating factors: Moving, walking, WB   Relieving factors: rest, OTC meds, ice    WEIGHT BEARING RESTRICTIONS: Yes:  WBAT   FALLS:  Has patient fallen in last 6 months? Yes. 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 Pt negotiates some small staircases in town. Pt has historically taken 24-packs of water bottles to his work for customers.    PLOF: Independent   PATIENT GOALS: "I want to return to work without hurting and falling as soon as I can."   PRECAUTIONS:  Posterior hip precautions      OBJECTIVE: (objective measures completed at initial evaluation unless otherwise dated)  PALPATION: Pain in L sacral border, Iliac crest, gluteus medius, TFL, and greater trochanter     LOWER EXTREMITY ROM:    Active ROM Right eval Left eval  Hip flexion WFL 20 deg with pain  Hip extension [facility]    Hip abduction WFL 20 with pain   Hip adduction [facility]    Hip internal rotation [facility]    Hip external rotation [facility]    Knee flexion [facility]  Knee extension [facility]  Ankle dorsiflexion [facility]  Ankle plantarflexion [facility]  Ankle inversion [facility]  Ankle eversion WFL WFL   (Blank rows = not tested)   LOWER EXTREMITY MMT:   MMT Right eval Left eval Left 12/04/22 Left 12/18/22 Left 01/29/23 Left 03/15/23 Left 04/11/23 Left 04/30/23 Left 05/16/23  Hip flexion WFL 2+ 4* 4- 4 4* 4* 4+ 4*  Hip extension WFL 2 3+  3+* 3+* 4 3+* 4-*  Hip abduction WFl 2 4-  3+* 4* 4* 3+* 3+*  Hip adduction WFl           Hip internal rotation [facility]           Hip external rotation [facility]           Knee flexion WFL 3- 3+ 4- 4 4+* 5 5 4+  Knee extension WFl 3 4+ 4* 4* 4-* 4+ 5 4*  Ankle dorsiflexion WFL 3+         Ankle plantarflexion WFL 4         Ankle inversion WFL 4         Ankle eversion WFL 3-          (Blank rows = not tested) Pt's L ankle remains inverted 10 degrees since the Surgery.     FUNCTIONAL TESTS (INITIAL EVALUATION) 30 seconds chair stand test: 5 reps completed Timed up and go (TUG): 19secs 10 meter walk test: 12secs= 1,2 m/secs             Balance Test : SL standing unable, Modified tandem 10 secs, Tandem Unable, Rhomberg with EO 5 secs and unable with EC.  GAIT: Distance walked: 30 ft  Assistive device utilized: Single point cane Level of assistance: SBA Comments: Pt unsteady on feet with severe antalgic gait, decreased loading response, absent heel strike and absent toe off gait with uneven stride and decreased speed. Heavy reliance on the [facility]    -----  03/27/23  Posture: Lumbar lordosis: WNL Iliac crest height: Equal bilaterally Lumbar lateral shift: Mild shift to R to offload LLE  AROM AROM (Normal range in degrees) AROM   Lumbar   Flexion (65) 75% (stopped at 90 deg hip flexion)  Extension (30)  50%  Right lateral flexion (25) 100%  Left lateral flexion (25) 100%  Right rotation (30) 75%  Left rotation (30) 75%      (* = pain; Blank rows = not tested)   Sensation Grossly intact to light touch throughout bilateral LEs as determined by testing dermatomes L2-S2. Proprioception, stereognosis, and hot/cold testing deferred on this date.   Special Tests Lumbar Radiculopathy and Discogenic: Centralization and Peripheralization (SN 92, -LR 0.12): Not examined Slump (SN 83, -LR 0.32): R: Negative L: Negative SLR (SN 92, -LR 0.29): R: Negative L:  Negative  Facet Joint: Extension-Rotation (SN 100, -LR 0.0): R: Negative L: Positive  Lumbar Foraminal Stenosis: Lumbar quadrant (SN 70): R: Negative L: Positive      TODAY'S TREATMENT:                                                                                                                              DATE: 07/05/2023    L hip hemiarthroplasty following L femoral neck fracture (DOS: 10/07/22)   SUBJECTIVE STATEMENT:  Pt ended up not going to [facility] earlier this week due to him feeling that he could wait until scheduled appointment on 07/10/23. Patient reports 1/10 NPRS at arrival to PT.     Today's Vitals   07/05/23 1436  BP: (!) 125/91  Pulse: 88        There is no height or weight on file to calculate BMI.    Therapeutic Exercise - for improved soft tissue flexibility and extensibility as needed for ROM, improved strength as needed to improve performance of CKC activities/functional movements   Nu-Step L3. Seat at 11 to maintain posterior hip flexion precautions. 5 minutes, for warm up  Glute bridge with single-leg knee extension,  alternating;  2x10  3-way hip, standing; 1x6 ea dir, bilat  Semitandem stance on Airex; 2 x 30 sec each position  Dynamic march, blue agility ladder; x4 D/B  Hip flexor stretch, opposite foot on second step; x20, 3 sec hold, dynamic stretch;  Alternating lateral step up to Airex pad; 1x10 alternating R/L    PATIENT EDUCATION: discussed continued home exercise program and discussing ongoing groin pain and activity limitations with surgeon next Tuesday.     *not today* Sit to stand, Airex on seat for inc seat height and Airex under feet; 2x8 Sidelying hip abduction, with 2-lb ankle weight; 2x8 Butterfly stretch; 2 x 30 sec hold  Cold pack (unbilled) - for anti-inflammatory and analgesic effect during e-stim as needed for reduced pain and improved ability to participate in active PT intervention, along L anterior in supine, x 5 minutes Forward monster walk (forward step + abduction) with Red Tband; 5x D/B length of // bars Heel walk , in // bars; x2 D/B; limited ability to maintain active dorsiflexion with stepping in bars Standing heel raise/toe raise; 2x15 alternating  Single limb stance; multiple attempts with up to 10 sec attained on each LE, in // bars  Hurdle step over 6-inch hurdle with focus on heel strike and heel to toe progression; 2 x10 ea LE Sidestep with Blue Tband superior to patellae; 4x D/B length of // bars      *not today* Lateral step up to 6-inch step; staircase in center of gym; 1x12  -pt has limited tolerance of CKC loading with vaulting off of LE on floor and lateral flexion to L when weight shifting to LLE Glute bridge with Green physioball under calves; 3x10 Manual prone quad stretch; 2 x 30 sec Prone quad stretch with bedsheet looped around ankle; x 30 sec  -for HEP demo  -pt has hamstring cramp when attempted gactive prone knee flexion hampering initiating of prone quad stretch Butt kicker along blue agility ladder; 2x D/B length of ladder for active  quad/hip flexor mobility work and weight shifting Side steps along blue agility ladder: x2 D/B with Blue Tband looped above patellae Alternating arms and legs with abdominal brace with Green physioball; x20 alternating R/L Forward step-up to opposite LE tap on next step; 2x10, 6-inch step (staircase center of clinic) Alternating hip flexion with Tband, in standing today; 2x8 alternating R/L with Red Tband Forward step over (3) 6-inch hurdles and (2) 12-inch hurdles, unsupported in open gym environment; 3x D/B with reciprocal patternProne hip extension, alternating; 2x10 alt; 2-lb ankle weights Butterfly stretch, technique reviewed for HEP Modified Thomas hip flexor stretch, technique reviewed for HEP Forward step up to Airex pad; 1x15 surgical LE leading  Prone knee bend, dynamic stretch; 1x10, 1 sec; for review Toe tapping; 2x10 alternating R/L with 12-inch step, no UE support; stance on Airex padThomas hip flexor stretch; reviewed Butterfly stretch in supine; reviewed Manual hip flexor stretch in R sidelying, gentle (hip to neutral); 2 x 30 sec Standing march with 5-lb ankle weights; 2 x 10 alternating, holding treadmill armrest Sit to stand: 1x6 and 1x4; from chair with 8-lb Goblet hold  Unipedal stance; 2 x 15 sec Bridge 3x12:  Black Tband at distal femurs with good carryover in form/technique.  Butterfly stretch; 3 x 20 sec        PATIENT EDUCATION:  Education details: see above for patient education details Person educated: Patient Education method: Explanation and Demonstration Education comprehension: verbalized understanding   HOME EXERCISE PROGRAM: Access Code: [email or web address] URL: [email or web address] Date: 04/18/2023 Prepared by: Consuela Mimes  Exercises - Sit to Stand  - 1 x daily - 7 x weekly - 2 sets - 10 reps - Supine Bridge with Resistance Band  - 1 x daily - 7 x weekly - 2 sets - 10 reps - Sidelying Hip Abduction  - 1 x daily - 7 x weekly - 2 sets -  10 reps - Prone Hip Extension  - 1 x daily - 7 x weekly - 2 sets - 10 reps - Supine Hip Flexion with Resistance Loop  - 1 x daily - 7 x weekly - 2 sets - 10 reps - Prone Femoral Nerve Mobilization  - 2 x daily - 7 x weekly - 2 sets - 10 reps - 1sec hold     ASSESSMENT:   CLINICAL IMPRESSION:  Patient still has relatively low NPRS with some feeling of pressure/discomfort along medial groin/proximal adductor on L side. Pt is continuing with stretching and strengthening program at home. We are working more toward standing drills and postural control work in PT along with advanced gluteal strengthening. Pt does have ongoing hip weakness in spite of substantial time in PT; pt is  largely limited by persistent weightbearing activity limitation and groin discomfort that can worsen with open-chain hip strengthening and weightbearing drills. I communicated with [facility] regarding concerns for persistent pain longer than expected for his procedure, and consideration of alternative treatments for symptom modulation (e.g. dry needling). Pt has remaining deficits in hip flexor, quadriceps, and adductor flexibility/extensibility; hip ABD/EXT/flexor strength, weightbearing tolerance, gait deviations (L hip IR, toe-in, dec eccentric control of L dorsiflexors), and limitations with negotiating steps/curbs/uneven terrain. Patient will benefit from continued skilled therapeutic intervention to address the remaining deficits in hip strength, mobility, and weightbearing tolerance as needed for improved function and ability to perform work duties.      OBJECTIVE IMPAIRMENTS: Abnormal gait, decreased activity tolerance, decreased balance, decreased endurance, decreased knowledge of condition, decreased knowledge of use of DME, decreased mobility, difficulty walking, decreased ROM, decreased strength, and dizziness.    ACTIVITY LIMITATIONS: carrying, lifting, bending, sitting, standing, squatting, sleeping, stairs,  transfers, dressing, and locomotion level   PARTICIPATION LIMITATIONS: cleaning, shopping, community activity, occupation, and yard work   PERSONAL FACTORS:  Pt is impulsive and eager to get better so that he can return to work.   are also affecting patient's functional outcome.    REHAB POTENTIAL: Good   CLINICAL DECISION MAKING: Stable/uncomplicated   EVALUATION COMPLEXITY: Low     GOALS: Goals reviewed with patient? Yes   SHORT TERM GOALS: Target date: 12/22/2022 Pt will improve L hip ROM to 10 to 90 degrees without pain in order to promote normal gait and stability.  Baseline: 20 degree Hip felx and 0 deg Ext 12/04/22: 90 deg hip flex and 5 deg EXT 12/18/22: 90 deg hip flex and 10 deg hip EXT Goal status: ACHIEVED    2.  Pt will be able to ambulate 543ft safely with Cane without fall and pain . Baseline: 30 ft with SBA and heavy reliance on [facility].   12/04/22: Pt uses SPC for primary means of mobility in home and community at this time.  Goal status: ACHIEVED       LONG TERM GOALS: Target date: 01/26/2023   Pt will Improve TUG time to <10secs without SPC to demonstrate decreased fall risk.  Baseline: 19 secs with SPC 12/04/22: 14.6 seconds with SPC 12/18/22: 9.5 sec Goal status: ACHIEVED   2.  Pt will Improve LEFS score to >65/80 to demonstrate improved QoL Baseline: 18/80 12/04/22: 20/80  12/18/22: 22/80 01/29/23: 25/80  03/15/23: 24/80 04/09/23: 27/80  04/30/23: 26/80 05/16/23:  19/80 Goal status: NOT MET   3.  Pt will be able to ambulate Independently  with LRAD without LOB and pain in order to return to community level mobility safely  Baseline: With [facility] and [facility].   12/04/22: ModI ambulation with SPC/LRAD with mild pain at longer distances, no LOB 12/18/22: Amb IND with SPC with no LOB Goal status: ACHIEVED    4.  Pt will score >50 on FOTO Baseline: 36/56 12/04/22: 35/56 12/18/22: 37/56 01/29/23: 43/56 03/15/23: 38/56 04/09/23: 43/56 04/30/23: 41/56 05/16/23:  38/56 Goal status:  NOT MET   5.  Pt will demonstrate 4/5 L hip strength without pain to improve stability and safety with all functional mobility in order to return to work.  Baseline: 2/5 grossly    12/04/22: 4-/5 grossly 12/18/22: 4-/5 grossly 01/29/23: 4/5 for hip flexors/quads/HS, 3+/5 for hip ABD and EXT.  03/15/23: Remaining weakness in hip flexors, quads, abductors, extensors.  04/09/23: Improved strength with pt up to at least 4/5, pain with loading hip flexors  and abductors  04/30/23: Weak hip abduction and extension MMT with heightened pain; good strength otherwise 05/16/23: Weak hip flexion, abduction, extension; weak quads; pain with hip flexor and gluteal MMTs Goal status: NOT MET        PLAN:   PT FREQUENCY: 2x/week   PT DURATION: 4 weeks   PLANNED INTERVENTIONS: Therapeutic exercises, Therapeutic activity, Neuromuscular re-education, Balance training, Gait training, Patient/Family education, Self Care, and Joint mobilization   PLAN FOR NEXT SESSION: Continue with strategies for pain modulation prn for L quad/hip flexor region and progress trunk stabilization and hip strengthening as able (focus on hip abductor/extensor strengthening); progressive weightbearing activity as tolerated.     Consuela Mimes, PT, DPT [phone] Physical Therapist- [facility]  07/05/2023, 2:43 PM

## 2023-07-09 ENCOUNTER — Encounter: Payer: Self-pay | Admitting: Physical Therapy

## 2023-07-10 ENCOUNTER — Ambulatory Visit: Payer: Worker's Compensation | Admitting: Physical Therapy

## 2023-07-10 VITALS — BP 136/88 | HR 83

## 2023-07-10 DIAGNOSIS — R29898 Other symptoms and signs involving the musculoskeletal system: Secondary | ICD-10-CM

## 2023-07-10 DIAGNOSIS — M25552 Pain in left hip: Secondary | ICD-10-CM

## 2023-07-10 DIAGNOSIS — Z96642 Presence of left artificial hip joint: Secondary | ICD-10-CM

## 2023-07-10 DIAGNOSIS — R262 Difficulty in walking, not elsewhere classified: Secondary | ICD-10-CM

## 2023-07-10 DIAGNOSIS — R2689 Other abnormalities of gait and mobility: Secondary | ICD-10-CM

## 2023-07-10 NOTE — Therapy (Unsigned)
OUTPATIENT PHYSICAL THERAPY TREATMENT AND PROGRESS NOTE/RE-CERTIFICATION   Dates of reporting period  05/16/23   to   07/10/23   Patient Name: Christian Hughes MRN: [id number] DOB:[birth year], 62 y.o., male Today's Date: 07/10/2023   END OF SESSION:   PT End of Session - 07/10/23 1440     Visit Number 50    Number of Visits 50    Date for PT Re-Evaluation 02/28/23    Authorization Type Workers Compensation    Progress Note Due on Visit 50    PT Start Time 1433    PT Stop Time 1514    PT Time Calculation (min) 41 min    Equipment Utilized During Treatment Gait belt    Activity Tolerance Patient tolerated treatment well;Patient limited by pain    Behavior During Therapy WFL for tasks assessed/performed;Impulsive              Past Medical History:  Diagnosis Date   Engages in vaping    H/O ventral hernia    S/p of repair   Hypertension Early 2010's   Marijuana use    Orthostatic dizziness 10/08/2022   Past Surgical History:  Procedure Laterality Date   HIP ARTHROPLASTY Left 10/07/2022   Procedure: ARTHROPLASTY BIPOLAR HIP (HEMIARTHROPLASTY);  Surgeon: Christian Fairly, MD;  Location: [facility] ORS;  Service: Orthopedics;  Laterality: Left;   JOINT REPLACEMENT  10/06/2022   Left Hip Replacement due fall while at work   Ventral hernia repair     Patient Active Problem List   Diagnosis Date Noted   Asymptomatic hypertensive urgency 04/05/2023   Osteopenia determined by x-ray 02/12/2023   Hyperlipidemia 01/10/2023   Prediabetes 01/10/2023   Primary hypertension 01/10/2023   Left ventricular hypertrophy 01/10/2023   Abnormal EKG 10/06/2022   Bone lesion_sclerotic lesions in the right iliac bone 10/06/2022    PCP: Christian Oats MD  REFERRING PROVIDER: Juanell Fairly MD   REFERRING DIAG: [phone] (ICD-10-CM) - Presence of left artificial hip joint   THERAPY DIAG:  History of left hip hemiarthroplasty  Pain in left hip  Weakness of left leg  Balance  problems  Difficulty in walking, not elsewhere classified  Rationale for Evaluation and Treatment Rehabilitation  PERTINENT HISTORY: From initial eval: Christian Hughes is a 62 y.o. male without significant past medical history except for occasional vaping amd marijuana use, who presents with fall and left hip pain.  Pt states that he slipped and fell at work at about 11:00 AM.  Denies loss of consciousness.  He injured his left hip, causing left hip pain, which is constant, sharp, severe, nonradiating, aggravated by movement.  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    "My mom took care of me after my hip surgery. I had therapy at home. I returned to my home alone after my first follow up appointment. I have pain in my buttocks, groin and the corner of my buttocks of about 2 to 3/10. I have high pain tolerance. I am unable to walk normal and I feel weak. I began to use this cane on my own after my PT at home stopped.  I can't wait to go to work because, I love to work."   PAIN:  Are you having pain? Yes: Pain location: 2-3/10 Pain description: stabbing Aggravating factors: Moving, walking, WB   Relieving factors: rest, OTC meds, ice    WEIGHT BEARING  RESTRICTIONS: Yes:  WBAT   FALLS:  Has patient fallen in last 6 months? Yes. Number of falls once once 3 months ago   LIVING ENVIRONMENT: Lives with: lives alone Lives in: Mobile home Stairs: Yes: External: 4 steps; can reach both Has following equipment at home: Single point cane   OCCUPATION: Works in the office for [employer]. 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 Pt negotiates some small staircases in town. Pt has historically taken 24-packs of water bottles to his work for customers.    PLOF: Independent   PATIENT  GOALS: "I want to return to work without hurting and falling as soon as I can."   PRECAUTIONS: Posterior hip precautions      OBJECTIVE: (objective measures completed at initial evaluation unless otherwise dated)  PALPATION: Pain in L sacral border, Iliac crest, gluteus medius, TFL, and greater trochanter     LOWER EXTREMITY ROM:    Active ROM Right eval Left eval  Hip flexion WFL 20 deg with pain  Hip extension [facility]    Hip abduction WFL 20 with pain   Hip adduction [facility]    Hip internal rotation [facility]    Hip external rotation [facility]    Knee flexion [facility]  Knee extension [facility]  Ankle dorsiflexion [facility]  Ankle plantarflexion [facility]  Ankle inversion [facility]  Ankle eversion WFL WFL   (Blank rows = not tested)   LOWER EXTREMITY MMT:   MMT Right eval Left eval Left 12/04/22 Left 12/18/22 Left 01/29/23 Left 03/15/23 Left 04/11/23 Left 04/30/23 Left 05/16/23 Left 07/10/23  Hip flexion WFL 2+ 4* 4- 4 4* 4* 4+ 4* 4+  Hip extension WFL 2 3+  3+* 3+* 4 3+* 4-* 4*  Hip abduction WFl 2 4-  3+* 4* 4* 3+* 3+* 4-*  Hip adduction WFl            Hip internal rotation [facility]            Hip external rotation [facility]            Knee flexion WFL 3- 3+ 4- 4 4+* 5 5 4+ 5  Knee extension WFl 3 4+ 4* 4* 4-* 4+ 5 4* 5-  Ankle dorsiflexion WFL 3+          Ankle plantarflexion WFL 4          Ankle inversion WFL 4          Ankle eversion WFL 3-           (Blank rows = not tested) Pt's L ankle remains inverted 10 degrees since the Surgery.     FUNCTIONAL TESTS (INITIAL EVALUATION) 30 seconds chair stand test: 5 reps completed Timed up and go (TUG): 19secs 10 meter walk test: 12secs= 1,2 m/secs             Balance Test : SL standing unable, Modified tandem 10 secs, Tandem Unable, Rhomberg with EO 5 secs and unable with EC.  GAIT: Distance walked: 30 ft  Assistive device utilized: Single point cane Level of assistance: SBA Comments: Pt unsteady on feet with severe antalgic gait, decreased  loading response, absent heel strike and absent toe off gait with uneven stride and decreased speed. Heavy reliance on the [facility]   -----  03/27/23  Posture: Lumbar lordosis: WNL Iliac crest height: Equal bilaterally Lumbar lateral shift:  Mild shift to R to offload LLE  AROM AROM (Normal range in degrees) AROM   Lumbar   Flexion (65) 75% (stopped at 90 deg hip flexion)  Extension (30) 50%  Right lateral flexion (25) 100%  Left lateral flexion (25) 100%  Right rotation (30) 75%  Left rotation (30) 75%      (* = pain; Blank rows = not tested)   Sensation Grossly intact to light touch throughout bilateral LEs as determined by testing dermatomes L2-S2. Proprioception, stereognosis, and hot/cold testing deferred on this date.   Special Tests Lumbar Radiculopathy and Discogenic: Centralization and Peripheralization (SN 92, -LR 0.12): Not examined Slump (SN 83, -LR 0.32): R: Negative L: Negative SLR (SN 92, -LR 0.29): R: Negative L:  Negative  Facet Joint: Extension-Rotation (SN 100, -LR 0.0): R: Negative L: Positive  Lumbar Foraminal Stenosis: Lumbar quadrant (SN 70): R: Negative L: Positive      TODAY'S TREATMENT:                                                                                                                              DATE: 07/10/2023    L hip hemiarthroplasty following L femoral neck fracture (DOS: 10/07/22)   SUBJECTIVE STATEMENT:  Pt had f/u with physician today. They are referring him for imaging - pt describes bone scan/bone density testing. Pt feels he is most limited by abnormal gait pattern, medial groin pain, and discomfort along forefoot resulting from altered gait pattern. 2/10 NPRS at arrival. Pt was given padding for 1st MTP region to unload this area during ambulation. Patient reports 60-70% global rating of improvement at this time. Pt reports some challenge with walking around work facility and completing necessary errands in town (going to  [facility], going to post office) for his job or an entire day. Pt feels that working 1/2 day now is as much as he can handle.     Today's Vitals   07/10/23 1443  BP: 136/88  Pulse: 83     There is no height or weight on file to calculate BMI.    Therapeutic Exercise - for improved soft tissue flexibility and extensibility as needed for ROM, improved strength as needed to improve performance of CKC activities/functional movements   Nu-Step L3. Seat at 11 to maintain posterior hip flexion precautions. 5 minutes, for warm up   *GOAL UPDATE PERFORMED   3-way hip, standing; 1x5 ea dir, bilat  Forward hurdle step-over, heel to toe; reviewed for HEP    PATIENT EDUCATION: discussed current PT plan of care, factors affecting prolonged episode of care. HEP updated and reviewed.     *next visit* Dynamic march, blue agility ladder; x4 D/B Semitandem stance on Airex; 2 x 30 sec each position Alternating lateral step up to Airex pad; 1x10 alternating R/L Hip flexor stretch, opposite foot on second step; x20, 3 sec hold, dynamic stretch;   *not today* Glute bridge with single-leg knee extension, alternating;  2x10 Sit to stand, Airex on seat for inc seat height and Airex under feet; 2x8 Sidelying hip abduction, with 2-lb ankle weight; 2x8 Butterfly stretch; 2 x 30 sec hold  Cold pack (unbilled) - for anti-inflammatory and analgesic effect during e-stim as needed for reduced pain and improved ability to participate in active PT intervention, along L anterior in supine, x 5 minutes Forward monster walk (forward step + abduction) with Red Tband; 5x D/B length of // bars Heel walk , in // bars; x2 D/B; limited ability to maintain active dorsiflexion with stepping in bars Standing heel raise/toe raise; 2x15 alternating  Single limb stance; multiple attempts with up to 10 sec attained on each LE, in // bars Hurdle step over 6-inch hurdle with focus on heel strike and heel to toe progression;  2 x10 ea LE Sidestep with Blue Tband superior to patellae; 4x D/B length of // bars    *not today* Lateral step up to 6-inch step; staircase in center of gym; 1x12  -pt has limited tolerance of CKC loading with vaulting off of LE on floor and lateral flexion to L when weight shifting to LLE Glute bridge with Green physioball under calves; 3x10 Manual prone quad stretch; 2 x 30 sec Prone quad stretch with bedsheet looped around ankle; x 30 sec  -for HEP demo  -pt has hamstring cramp when attempted gactive prone knee flexion hampering initiating of prone quad stretch Butt kicker along blue agility ladder; 2x D/B length of ladder for active quad/hip flexor mobility work and weight shifting Side steps along blue agility ladder: x2 D/B with Blue Tband looped above patellae Alternating arms and legs with abdominal brace with Green physioball; x20 alternating R/L Forward step-up to opposite LE tap on next step; 2x10, 6-inch step (staircase center of clinic) Alternating hip flexion with Tband, in standing today; 2x8 alternating R/L with Red Tband Forward step over (3) 6-inch hurdles and (2) 12-inch hurdles, unsupported in open gym environment; 3x D/B with reciprocal patternProne hip extension, alternating; 2x10 alt; 2-lb ankle weights Butterfly stretch, technique reviewed for HEP Modified Thomas hip flexor stretch, technique reviewed for HEP Forward step up to Airex pad; 1x15 surgical LE leading  Prone knee bend, dynamic stretch; 1x10, 1 sec; for review Toe tapping; 2x10 alternating R/L with 12-inch step, no UE support; stance on Airex padThomas hip flexor stretch; reviewed Butterfly stretch in supine; reviewed Manual hip flexor stretch in R sidelying, gentle (hip to neutral); 2 x 30 sec Standing march with 5-lb ankle weights; 2 x 10 alternating, holding treadmill armrest Sit to stand: 1x6 and 1x4; from chair with 8-lb Goblet hold  Unipedal stance; 2 x 15 sec Bridge 3x12:  Black Tband at distal  femurs with good carryover in form/technique.  Butterfly stretch; 3 x 20 sec        PATIENT EDUCATION:  Education details: see above for patient education details Person educated: Patient Education method: Explanation and Demonstration Education comprehension: verbalized understanding   HOME EXERCISE PROGRAM: Access Code: [email or web address] URL: [email or web address] Date: 04/18/2023 Prepared by: Consuela Mimes  Exercises - Sit to Stand  - 1 x daily - 7 x weekly - 2 sets - 10 reps - Supine Bridge with Resistance Band  - 1 x daily - 7 x weekly - 2 sets - 10 reps - Sidelying Hip Abduction  - 1 x daily - 7 x weekly - 2 sets - 10 reps - Prone Hip Extension  - 1 x daily - 7 x weekly -  2 sets - 10 reps - Supine Hip Flexion with Resistance Loop  - 1 x daily - 7 x weekly - 2 sets - 10 reps - Prone Femoral Nerve Mobilization  - 2 x daily - 7 x weekly - 2 sets - 10 reps - 1sec hold     ASSESSMENT:   CLINICAL IMPRESSION:  Patient has made fair progress toward his long-term strength goal. The main goals that have largely failed to progress are survey-based outcome measures (LEFS and FOTO). Patient reports concern over gait deviations - largely with his surgical limb demonstrating toe-in and some loss of dorsiflexor eccentric control. Patient has met all other goals outside of MMT, LEFS, and FOTO goals. Patient has experienced persistent groin and adductor pain longer than expected s/p hemiarthroplasty following proximal femur Fx in spite of ongoing stretching program, modalities, TENS, therapeutic exercise, and substantial time removed from trauma and surgery. Pt does appear to give good faith effort in clinic. He reports low NPRS at baseline, but discomfort along groin/hip flexor region notably limits hip strengthening and weightbearing drills. Pt has remaining deficits in hip flexor, quadriceps, and adductor flexibility; hip ABD/EXT strength, weightbearing tolerance, gait deviations (L hip  IR, toe-in, dec eccentric control of L dorsiflexors), and limitations with negotiating steps/curbs/uneven terrain. Patient will benefit from continued skilled therapeutic intervention to address the remaining deficits in hip strength, mobility, and weightbearing tolerance as needed for improved function and ability to perform work duties.      OBJECTIVE IMPAIRMENTS: Abnormal gait, decreased activity tolerance, decreased balance, decreased endurance, decreased knowledge of condition, decreased knowledge of use of DME, decreased mobility, difficulty walking, decreased ROM, decreased strength, and dizziness.    ACTIVITY LIMITATIONS: carrying, lifting, bending, sitting, standing, squatting, sleeping, stairs, transfers, dressing, and locomotion level   PARTICIPATION LIMITATIONS: cleaning, shopping, community activity, occupation, and yard work   PERSONAL FACTORS:  Pt is impulsive and eager to get better so that he can return to work.   are also affecting patient's functional outcome.    REHAB POTENTIAL: Good   CLINICAL DECISION MAKING: Stable/uncomplicated   EVALUATION COMPLEXITY: Low     GOALS: Goals reviewed with patient? Yes   SHORT TERM GOALS: Target date: 12/22/2022 Pt will improve L hip ROM to 10 to 90 degrees without pain in order to promote normal gait and stability.  Baseline: 20 degree Hip felx and 0 deg Ext 12/04/22: 90 deg hip flex and 5 deg EXT 12/18/22: 90 deg hip flex and 10 deg hip EXT Goal status: ACHIEVED    2.  Pt will be able to ambulate 560ft safely with Cane without fall and pain . Baseline: 30 ft with SBA and heavy reliance on [facility].   12/04/22: Pt uses SPC for primary means of mobility in home and community at this time.  Goal status: ACHIEVED       LONG TERM GOALS: Target date: 01/26/2023   Pt will Improve TUG time to <10secs without SPC to demonstrate decreased fall risk.  Baseline: 19 secs with SPC 12/04/22: 14.6 seconds with SPC 12/18/22: 9.5 sec Goal status:  ACHIEVED   2.  Pt will Improve LEFS score to >65/80 to demonstrate improved QoL Baseline: 18/80 12/04/22: 20/80  12/18/22: 22/80 01/29/23: 25/80  03/15/23: 24/80 04/09/23: 27/80  04/30/23: 26/80 05/16/23:  19/80 07/10/23: 23/80 Goal status: NOT MET   3.  Pt will be able to ambulate Independently  with LRAD without LOB and pain in order to return to community level mobility safely  Baseline:  With [facility] and FWW.   12/04/22: ModI ambulation with SPC/LRAD with mild pain at longer distances, no LOB 12/18/22: Amb IND with SPC with no LOB Goal status: ACHIEVED    4.  Pt will score >50 on FOTO Baseline: 36/56 12/04/22: 35/56 12/18/22: 37/56 01/29/23: 43/56 03/15/23: 38/56 04/09/23: 43/56 04/30/23: 41/56 05/16/23:  38/56 07/10/23: 40/56 Goal status: NOT MET   5.  Pt will demonstrate 4/5 L hip strength without pain to improve stability and safety with all functional mobility in order to return to work.  Baseline: 2/5 grossly    12/04/22: 4-/5 grossly 12/18/22: 4-/5 grossly 01/29/23: 4/5 for hip flexors/quads/HS, 3+/5 for hip ABD and EXT.  03/15/23: Remaining weakness in hip flexors, quads, abductors, extensors.  04/09/23: Improved strength with pt up to at least 4/5, pain with loading hip flexors and abductors  04/30/23: Weak hip abduction and extension MMT with heightened pain; good strength otherwise 05/16/23: Weak hip flexion, abduction, extension; weak quads; pain with hip flexor and gluteal MMTs 07/10/23: Weakness primarily remaining in hip abduction only, pain with loading gluteal mm Goal status: IN PROGRESS        PLAN:   PT FREQUENCY: 2x/week   PT DURATION: 4 weeks   PLANNED INTERVENTIONS: Therapeutic exercises, Therapeutic activity, Neuromuscular re-education, Balance training, Gait training, Patient/Family education, Self Care, and Joint mobilization   PLAN FOR NEXT SESSION: Continue with strategies for pain modulation prn for L quad/hip flexor region and progress trunk stabilization and hip  strengthening as able (focus on hip abductor/extensor strengthening); progressive weightbearing activity as tolerated.     Consuela Mimes, PT, DPT [phone] Physical Therapist- [facility]  07/10/2023, 3:17 PM

## 2023-07-11 ENCOUNTER — Encounter: Payer: Self-pay | Admitting: Physical Therapy

## 2023-07-12 ENCOUNTER — Ambulatory Visit: Payer: Worker's Compensation | Admitting: Physical Therapy

## 2023-07-12 VITALS — BP 113/80 | HR 75

## 2023-07-12 DIAGNOSIS — Z96642 Presence of left artificial hip joint: Secondary | ICD-10-CM | POA: Diagnosis not present

## 2023-07-12 DIAGNOSIS — M25552 Pain in left hip: Secondary | ICD-10-CM

## 2023-07-12 DIAGNOSIS — R262 Difficulty in walking, not elsewhere classified: Secondary | ICD-10-CM

## 2023-07-12 DIAGNOSIS — R29898 Other symptoms and signs involving the musculoskeletal system: Secondary | ICD-10-CM

## 2023-07-12 DIAGNOSIS — R2689 Other abnormalities of gait and mobility: Secondary | ICD-10-CM

## 2023-07-12 NOTE — Therapy (Addendum)
OUTPATIENT PHYSICAL THERAPY TREATMENT    Patient Name: Christian Hughes MRN: [id number] DOB:12-May-1961, 62 y.o., male Today's Date: 07/12/2023   END OF SESSION:   PT End of Session - 07/12/23 1428     Visit Number 51    Number of Visits 58    Date for PT Re-Evaluation 02/28/23    Authorization Type Workers Compensation    Progress Note Due on Visit 50    PT Start Time 1430    PT Stop Time 1512    PT Time Calculation (min) 42 min    Equipment Utilized During Treatment Gait belt    Activity Tolerance Patient tolerated treatment well;Patient limited by pain    Behavior During Therapy WFL for tasks assessed/performed;Impulsive             Past Medical History:  Diagnosis Date   Engages in vaping    H/O ventral hernia    S/p of repair   Hypertension Early 2010's   Marijuana use    Orthostatic dizziness 10/08/2022   Past Surgical History:  Procedure Laterality Date   HIP ARTHROPLASTY Left 10/07/2022   Procedure: ARTHROPLASTY BIPOLAR HIP (HEMIARTHROPLASTY);  Surgeon: Juanell Fairly, MD;  Location: [facility] ORS;  Service: Orthopedics;  Laterality: Left;   JOINT REPLACEMENT  10/06/2022   Left Hip Replacement due fall while at work   Ventral hernia repair     Patient Active Problem List   Diagnosis Date Noted   Asymptomatic hypertensive urgency 04/05/2023   Osteopenia determined by x-ray 02/12/2023   Hyperlipidemia 01/10/2023   Prediabetes 01/10/2023   Primary hypertension 01/10/2023   Left ventricular hypertrophy 01/10/2023   Abnormal EKG 10/06/2022   Bone lesion_sclerotic lesions in the right iliac bone 10/06/2022    PCP: Dewaine Oats MD  REFERRING PROVIDER: Juanell Fairly MD   REFERRING DIAG: [phone] (ICD-10-CM) - Presence of left artificial hip joint   THERAPY DIAG:  History of left hip hemiarthroplasty  Pain in left hip  Weakness of left leg  Balance problems  Difficulty in walking, not elsewhere classified  Rationale for Evaluation and Treatment  Rehabilitation  PERTINENT HISTORY: From initial eval: Christian Hughes is a 62 y.o. male without significant past medical history except for occasional vaping amd marijuana use, who presents with fall and left hip pain.  Pt states that he slipped and fell at work at about 11:00 AM.  Denies loss of consciousness.  He injured his left hip, causing left hip pain, which is constant, sharp, severe, nonradiating, aggravated by movement.  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    "My mom took care of me after my hip surgery. I had therapy at home. I returned to my home alone after my first follow up appointment. I have pain in my buttocks, groin and the corner of my buttocks of about 2 to 3/10. I have high pain tolerance. I am unable to walk normal and I feel weak. I began to use this cane on my own after my PT at home stopped.  I can't wait to go to work because, I love to work."   PAIN:  Are you having pain? Yes: Pain location: 2-3/10 Pain description: stabbing Aggravating factors: Moving, walking, WB   Relieving factors: rest, OTC meds, ice    WEIGHT BEARING RESTRICTIONS: Yes:  WBAT   FALLS:  Has patient fallen in last 6 months? Yes. Number  of falls once once 3 months ago   LIVING ENVIRONMENT: Lives with: lives alone Lives in: Mobile home Stairs: Yes: External: 4 steps; can reach both Has following equipment at home: Single point cane   OCCUPATION: Works in the office for [employer]. 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 Pt negotiates some small staircases in town. Pt has historically taken 24-packs of water bottles to his work for customers.    PLOF: Independent   PATIENT GOALS: "I want to return to work without hurting and falling as soon as I can."   PRECAUTIONS:  Posterior hip precautions      OBJECTIVE: (objective measures completed at initial evaluation unless otherwise dated)  PALPATION: Pain in L sacral border, Iliac crest, gluteus medius, TFL, and greater trochanter     LOWER EXTREMITY ROM:    Active ROM Right eval Left eval  Hip flexion WFL 20 deg with pain  Hip extension [facility]    Hip abduction WFL 20 with pain   Hip adduction [facility]    Hip internal rotation [facility]    Hip external rotation [facility]    Knee flexion [facility]  Knee extension [facility]  Ankle dorsiflexion [facility]  Ankle plantarflexion [facility]  Ankle inversion [facility]  Ankle eversion WFL WFL   (Blank rows = not tested)   LOWER EXTREMITY MMT:   MMT Right eval Left eval Left 12/04/22 Left 12/18/22 Left 01/29/23 Left 03/15/23 Left 04/11/23 Left 04/30/23 Left 05/16/23 Left 07/10/23  Hip flexion WFL 2+ 4* 4- 4 4* 4* 4+ 4* 4+  Hip extension WFL 2 3+  3+* 3+* 4 3+* 4-* 4*  Hip abduction WFl 2 4-  3+* 4* 4* 3+* 3+* 4-*  Hip adduction WFl            Hip internal rotation [facility]            Hip external rotation [facility]            Knee flexion WFL 3- 3+ 4- 4 4+* 5 5 4+ 5  Knee extension WFl 3 4+ 4* 4* 4-* 4+ 5 4* 5-  Ankle dorsiflexion WFL 3+          Ankle plantarflexion WFL 4          Ankle inversion WFL 4          Ankle eversion WFL 3-           (Blank rows = not tested) Pt's L ankle remains inverted 10 degrees since the Surgery.     FUNCTIONAL TESTS (INITIAL EVALUATION) 30 seconds chair stand test: 5 reps completed Timed up and go (TUG): 19secs 10 meter walk test: 12secs= 1,2 m/secs             Balance Test : SL standing unable, Modified tandem 10 secs, Tandem Unable, Rhomberg with EO 5 secs and unable with EC.  GAIT: Distance walked: 30 ft  Assistive device utilized: Single point cane Level of assistance: SBA Comments: Pt unsteady on feet with severe antalgic gait, decreased loading response, absent heel strike and absent toe off gait with uneven stride and decreased speed.  Heavy reliance on the [facility]   -----  03/27/23  Posture: Lumbar lordosis: WNL Iliac crest height: Equal bilaterally Lumbar lateral shift: Mild shift to R to offload LLE  AROM AROM (Normal range in degrees) AROM  Lumbar   Flexion (65) 75% (stopped at 90 deg hip flexion)  Extension (30) 50%  Right lateral flexion (25) 100%  Left lateral flexion (25) 100%  Right rotation (30) 75%  Left rotation (30) 75%      (* = pain; Blank rows = not tested)   Sensation Grossly intact to light touch throughout bilateral LEs as determined by testing dermatomes L2-S2. Proprioception, stereognosis, and hot/cold testing deferred on this date.   Special Tests Lumbar Radiculopathy and Discogenic: Centralization and Peripheralization (SN 92, -LR 0.12): Not examined Slump (SN 83, -LR 0.32): R: Negative L: Negative SLR (SN 92, -LR 0.29): R: Negative L:  Negative  Facet Joint: Extension-Rotation (SN 100, -LR 0.0): R: Negative L: Positive  Lumbar Foraminal Stenosis: Lumbar quadrant (SN 70): R: Negative L: Positive      TODAY'S TREATMENT:                                                                                                                              DATE: 07/12/2023    L hip hemiarthroplasty following L femoral neck fracture (DOS: 10/07/22)   SUBJECTIVE STATEMENT:  Pt reports 2/10 pain at arrival to PT affecting medial groin region. Patient is awaiting diagnostic testing as ordered by referring physician/surgeon. Patient states that next step would be MRI per report from surgeon. Pt reports ongoing feeling of "pressure"/"tweak" along L medial groin region that he does not describe as pain - discomfort in groin and posterolateral gluteal region does limit activity, however.    Today's Vitals   07/12/23 1437  BP: 113/80  Pulse: 75  SpO2: 100%      There is no height or weight on file to calculate BMI.    Therapeutic Exercise - for improved soft tissue flexibility and  extensibility as needed for ROM, improved strength as needed to improve performance of CKC activities/functional movements   Nu-Step L3. Seat at 12 to maintain posterior hip flexion precautions. 5 minutes, for warm up  Glute bridge with single-leg knee extension, alternating;  2x10  Hip flexor stretch, opposite foot on second step; x20, 3 sec hold, dynamic stretch;  Forward hurdle step-over, heel to toe; 1x15 ea LE, over ankle 5-lb cuff weight (lying flat on floor)  Dynamic march, blue agility ladder; x4 D/B  Alternating lateral step up to Airex pad; 2x10 alternating R/L   PATIENT EDUCATION: discussed current POC, continued strengthening and stretching program, motor control re-raining for gait deficits   *next visit* Semitandem stance on Airex; 2 x 30 sec each position   *not today* 3-way hip, standing; 1x5 ea dir, bilat Sit to stand, Airex on seat for inc seat height and Airex under feet; 2x8 Sidelying hip abduction, with 2-lb ankle weight; 2x8 Butterfly stretch; 2 x 30 sec hold  Cold pack (unbilled) - for anti-inflammatory and analgesic effect during e-stim as needed for reduced pain and improved ability to participate in active PT intervention, along L anterior in  supine, x 5 minutes Forward monster walk (forward step + abduction) with Red Tband; 5x D/B length of // bars Heel walk , in // bars; x2 D/B; limited ability to maintain active dorsiflexion with stepping in bars Standing heel raise/toe raise; 2x15 alternating  Single limb stance; multiple attempts with up to 10 sec attained on each LE, in // bars Hurdle step over 6-inch hurdle with focus on heel strike and heel to toe progression; 2 x10 ea LE Sidestep with Blue Tband superior to patellae; 4x D/B length of // bars    *not today* Lateral step up to 6-inch step; staircase in center of gym; 1x12  -pt has limited tolerance of CKC loading with vaulting off of LE on floor and lateral flexion to L when weight shifting to  LLE Glute bridge with Green physioball under calves; 3x10 Manual prone quad stretch; 2 x 30 sec Prone quad stretch with bedsheet looped around ankle; x 30 sec  -for HEP demo  -pt has hamstring cramp when attempted gactive prone knee flexion hampering initiating of prone quad stretch Butt kicker along blue agility ladder; 2x D/B length of ladder for active quad/hip flexor mobility work and weight shifting Side steps along blue agility ladder: x2 D/B with Blue Tband looped above patellae Alternating arms and legs with abdominal brace with Green physioball; x20 alternating R/L Forward step-up to opposite LE tap on next step; 2x10, 6-inch step (staircase center of clinic) Alternating hip flexion with Tband, in standing today; 2x8 alternating R/L with Red Tband Forward step over (3) 6-inch hurdles and (2) 12-inch hurdles, unsupported in open gym environment; 3x D/B with reciprocal patternProne hip extension, alternating; 2x10 alt; 2-lb ankle weights Butterfly stretch, technique reviewed for HEP Modified Thomas hip flexor stretch, technique reviewed for HEP Forward step up to Airex pad; 1x15 surgical LE leading  Prone knee bend, dynamic stretch; 1x10, 1 sec; for review Toe tapping; 2x10 alternating R/L with 12-inch step, no UE support; stance on Airex padThomas hip flexor stretch; reviewed Butterfly stretch in supine; reviewed Manual hip flexor stretch in R sidelying, gentle (hip to neutral); 2 x 30 sec Standing march with 5-lb ankle weights; 2 x 10 alternating, holding treadmill armrest Sit to stand: 1x6 and 1x4; from chair with 8-lb Goblet hold  Unipedal stance; 2 x 15 sec Bridge 3x12:  Black Tband at distal femurs with good carryover in form/technique.  Butterfly stretch; 3 x 20 sec        PATIENT EDUCATION:  Education details: see above for patient education details Person educated: Patient Education method: Explanation and Demonstration Education comprehension: verbalized  understanding   HOME EXERCISE PROGRAM: Access Code: [email or web address] URL: [email or web address] Date: 04/18/2023 Prepared by: Consuela Mimes  Exercises - Sit to Stand  - 1 x daily - 7 x weekly - 2 sets - 10 reps - Supine Bridge with Resistance Band  - 1 x daily - 7 x weekly - 2 sets - 10 reps - Sidelying Hip Abduction  - 1 x daily - 7 x weekly - 2 sets - 10 reps - Prone Hip Extension  - 1 x daily - 7 x weekly - 2 sets - 10 reps - Supine Hip Flexion with Resistance Loop  - 1 x daily - 7 x weekly - 2 sets - 10 reps - Prone Femoral Nerve Mobilization  - 2 x daily - 7 x weekly - 2 sets - 10 reps - 1sec hold     ASSESSMENT:   CLINICAL IMPRESSION:  Patient  demonstrates excellent hurdle step with sound heel strike and good eccentric control, no femoral/LE internal rotation following heel strike and loading response with specific motor control training for this drill. Patient's progress has been hampered by persistent groin pain limiting ability to tolerate weightbearing drills and hip strengthening. We have continued with open-chain exercises with modest load and volume; over last 2 weeks, we have emphasized more standing drills. Pt is awaiting further diagnostic testing for L hip per referring provider/surgeon given ongoing activity limitations. Pt has remaining deficits in hip flexor, quadriceps, and adductor flexibility; hip ABD/EXT strength, weightbearing tolerance, gait deviations (L hip IR, toe-in, dec eccentric control of L dorsiflexors), and limitations with negotiating steps/curbs/uneven terrain. Patient will benefit from continued skilled therapeutic intervention to address the remaining deficits in hip strength, mobility, and weightbearing tolerance as needed for improved function and ability to perform work duties.      OBJECTIVE IMPAIRMENTS: Abnormal gait, decreased activity tolerance, decreased balance, decreased endurance, decreased knowledge of condition, decreased knowledge  of use of DME, decreased mobility, difficulty walking, decreased ROM, decreased strength, and dizziness.    ACTIVITY LIMITATIONS: carrying, lifting, bending, sitting, standing, squatting, sleeping, stairs, transfers, dressing, and locomotion level   PARTICIPATION LIMITATIONS: cleaning, shopping, community activity, occupation, and yard work   PERSONAL FACTORS:  Pt is impulsive and eager to get better so that he can return to work.   are also affecting patient's functional outcome.    REHAB POTENTIAL: Good   CLINICAL DECISION MAKING: Stable/uncomplicated   EVALUATION COMPLEXITY: Low     GOALS: Goals reviewed with patient? Yes   SHORT TERM GOALS: Target date: 12/22/2022 Pt will improve L hip ROM to 10 to 90 degrees without pain in order to promote normal gait and stability.  Baseline: 20 degree Hip felx and 0 deg Ext 12/04/22: 90 deg hip flex and 5 deg EXT 12/18/22: 90 deg hip flex and 10 deg hip EXT Goal status: ACHIEVED    2.  Pt will be able to ambulate 544ft safely with Cane without fall and pain . Baseline: 30 ft with SBA and heavy reliance on [facility]   12/04/22: Pt uses SPC for primary means of mobility in home and community at this time.  Goal status: ACHIEVED       LONG TERM GOALS: Target date: 01/26/2023   Pt will Improve TUG time to <10secs without SPC to demonstrate decreased fall risk.  Baseline: 19 secs with SPC 12/04/22: 14.6 seconds with SPC 12/18/22: 9.5 sec Goal status: ACHIEVED   2.  Pt will Improve LEFS score to >65/80 to demonstrate improved QoL Baseline: 18/80 12/04/22: 20/80  12/18/22: 22/80 01/29/23: 25/80  03/15/23: 24/80 04/09/23: 27/80  04/30/23: 26/80 05/16/23:  19/80 07/10/23: 23/80 Goal status: NOT MET   3.  Pt will be able to ambulate Independently  with LRAD without LOB and pain in order to return to community level mobility safely  Baseline: With [facility] and [facility].   12/04/22: ModI ambulation with SPC/LRAD with mild pain at longer distances, no LOB 12/18/22: Amb  IND with SPC with no LOB Goal status: ACHIEVED    4.  Pt will score >50 on FOTO Baseline: 36/56 12/04/22: 35/56 12/18/22: 37/56 01/29/23: 43/56 03/15/23: 38/56 04/09/23: 43/56 04/30/23: 41/56 05/16/23:  38/56 07/10/23: 40/56 Goal status: NOT MET   5.  Pt will demonstrate 4/5 L hip strength without pain to improve stability and safety with all functional mobility in order to return to work.  Baseline: 2/5 grossly  12/04/22: 4-/5 grossly 12/18/22: 4-/5 grossly 01/29/23: 4/5 for hip flexors/quads/HS, 3+/5 for hip ABD and EXT.  03/15/23: Remaining weakness in hip flexors, quads, abductors, extensors.  04/09/23: Improved strength with pt up to at least 4/5, pain with loading hip flexors and abductors  04/30/23: Weak hip abduction and extension MMT with heightened pain; good strength otherwise 05/16/23: Weak hip flexion, abduction, extension; weak quads; pain with hip flexor and gluteal MMTs 07/10/23: Weakness primarily remaining in hip abduction only, pain with loading gluteal mm Goal status: IN PROGRESS        PLAN:   PT FREQUENCY: 2x/week   PT DURATION: 4 weeks   PLANNED INTERVENTIONS: Therapeutic exercises, Therapeutic activity, Neuromuscular re-education, Balance training, Gait training, Patient/Family education, Self Care, and Joint mobilization   PLAN FOR NEXT SESSION: Continue with strategies for pain modulation prn for L quad/hip flexor region and progress trunk stabilization and hip strengthening as able (focus on hip abductor/extensor strengthening); progressive weightbearing activity as tolerated.     Consuela Mimes, PT, DPT [phone] Physical Therapist- [facility]  07/12/2023, 2:38 PM

## 2023-07-16 ENCOUNTER — Other Ambulatory Visit: Payer: Self-pay | Admitting: Orthopedic Surgery

## 2023-07-16 ENCOUNTER — Encounter: Payer: Self-pay | Admitting: Physical Therapy

## 2023-07-16 DIAGNOSIS — M25552 Pain in left hip: Secondary | ICD-10-CM

## 2023-07-17 ENCOUNTER — Ambulatory Visit: Payer: Worker's Compensation | Admitting: Physical Therapy

## 2023-07-17 ENCOUNTER — Encounter: Payer: Self-pay | Admitting: Physical Therapy

## 2023-07-17 VITALS — BP 143/84 | HR 64

## 2023-07-17 DIAGNOSIS — M25552 Pain in left hip: Secondary | ICD-10-CM

## 2023-07-17 DIAGNOSIS — R29898 Other symptoms and signs involving the musculoskeletal system: Secondary | ICD-10-CM

## 2023-07-17 DIAGNOSIS — R2689 Other abnormalities of gait and mobility: Secondary | ICD-10-CM

## 2023-07-17 DIAGNOSIS — Z96642 Presence of left artificial hip joint: Secondary | ICD-10-CM | POA: Diagnosis not present

## 2023-07-17 DIAGNOSIS — R262 Difficulty in walking, not elsewhere classified: Secondary | ICD-10-CM

## 2023-07-17 NOTE — Therapy (Signed)
OUTPATIENT PHYSICAL THERAPY TREATMENT    Patient Name: Christian Hughes MRN: [id number] DOB:04-May-1961, 62 y.o., male Today's Date: 07/17/2023   END OF SESSION:   PT End of Session - 07/17/23 1430     Visit Number 52    Number of Visits 58    Date for PT Re-Evaluation 02/28/23    Authorization Type Workers Compensation    Progress Note Due on Visit 50    PT Start Time 1431    PT Stop Time 1513    PT Time Calculation (min) 42 min    Equipment Utilized During Treatment Gait belt    Activity Tolerance Patient tolerated treatment well;Patient limited by pain    Behavior During Therapy WFL for tasks assessed/performed;Impulsive              Past Medical History:  Diagnosis Date   Engages in vaping    H/O ventral hernia    S/p of repair   Hypertension Early 2010's   Marijuana use    Orthostatic dizziness 10/08/2022   Past Surgical History:  Procedure Laterality Date   HIP ARTHROPLASTY Left 10/07/2022   Procedure: ARTHROPLASTY BIPOLAR HIP (HEMIARTHROPLASTY);  Surgeon: Christian Fairly, MD;  Location: [facility] ORS;  Service: Orthopedics;  Laterality: Left;   JOINT REPLACEMENT  10/06/2022   Left Hip Replacement due fall while at work   Ventral hernia repair     Patient Active Problem List   Diagnosis Date Noted   Asymptomatic hypertensive urgency 04/05/2023   Osteopenia determined by x-ray 02/12/2023   Hyperlipidemia 01/10/2023   Prediabetes 01/10/2023   Primary hypertension 01/10/2023   Left ventricular hypertrophy 01/10/2023   Abnormal EKG 10/06/2022   Bone lesion_sclerotic lesions in the right iliac bone 10/06/2022    PCP: Christian Oats MD  REFERRING PROVIDER: Juanell Fairly MD   REFERRING DIAG: [phone] (ICD-10-CM) - Presence of left artificial hip joint   THERAPY DIAG:  History of left hip hemiarthroplasty  Pain in left hip  Weakness of left leg  Balance problems  Difficulty in walking, not elsewhere classified  Rationale for Evaluation and Treatment  Rehabilitation  PERTINENT HISTORY: From initial eval: Christian Hughes is a 62 y.o. male without significant past medical history except for occasional vaping amd marijuana use, who presents with fall and left hip pain.  Pt states that he slipped and fell at work at about 11:00 AM.  Denies loss of consciousness.  He injured his left hip, causing left hip pain, which is constant, sharp, severe, nonradiating, aggravated by movement.  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    "My mom took care of me after my hip surgery. I had therapy at home. I returned to my home alone after my first follow up appointment. I have pain in my buttocks, groin and the corner of my buttocks of about 2 to 3/10. I have high pain tolerance. I am unable to walk normal and I feel weak. I began to use this cane on my own after my PT at home stopped.  I can't wait to go to work because, I love to work."   PAIN:  Are you having pain? Yes: Pain location: 2-3/10 Pain description: stabbing Aggravating factors: Moving, walking, WB   Relieving factors: rest, OTC meds, ice    WEIGHT BEARING RESTRICTIONS: Yes:  WBAT   FALLS:  Has patient fallen in last 6 months? Yes.  Number of falls once once 3 months ago   LIVING ENVIRONMENT: Lives with: lives alone Lives in: Mobile home Stairs: Yes: External: 4 steps; can reach both Has following equipment at home: Single point cane   OCCUPATION: Works in the office for [employer]. 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 Pt negotiates some small staircases in town. Pt has historically taken 24-packs of water bottles to his work for customers.    PLOF: Independent   PATIENT GOALS: "I want to return to work without hurting and falling as soon as I can."   PRECAUTIONS:  Posterior hip precautions      OBJECTIVE: (objective measures completed at initial evaluation unless otherwise dated)  PALPATION: Pain in L sacral border, Iliac crest, gluteus medius, TFL, and greater trochanter     LOWER EXTREMITY ROM:    Active ROM Right eval Left eval  Hip flexion WFL 20 deg with pain  Hip extension [facility]    Hip abduction WFL 20 with pain   Hip adduction [facility]    Hip internal rotation [facility]    Hip external rotation [facility]    Knee flexion [facility]  Knee extension [facility]  Ankle dorsiflexion [facility]  Ankle plantarflexion [facility]  Ankle inversion [facility]  Ankle eversion WFL WFL   (Blank rows = not tested)   LOWER EXTREMITY MMT:   MMT Right eval Left eval Left 12/04/22 Left 12/18/22 Left 01/29/23 Left 03/15/23 Left 04/11/23 Left 04/30/23 Left 05/16/23 Left 07/10/23  Hip flexion WFL 2+ 4* 4- 4 4* 4* 4+ 4* 4+  Hip extension WFL 2 3+  3+* 3+* 4 3+* 4-* 4*  Hip abduction WFl 2 4-  3+* 4* 4* 3+* 3+* 4-*  Hip adduction WFl            Hip internal rotation [facility]            Hip external rotation [facility]            Knee flexion WFL 3- 3+ 4- 4 4+* 5 5 4+ 5  Knee extension WFl 3 4+ 4* 4* 4-* 4+ 5 4* 5-  Ankle dorsiflexion WFL 3+          Ankle plantarflexion WFL 4          Ankle inversion WFL 4          Ankle eversion WFL 3-           (Blank rows = not tested) Pt's L ankle remains inverted 10 degrees since the Surgery.     FUNCTIONAL TESTS (INITIAL EVALUATION) 30 seconds chair stand test: 5 reps completed Timed up and go (TUG): 19secs 10 meter walk test: 12secs= 1,2 m/secs             Balance Test : SL standing unable, Modified tandem 10 secs, Tandem Unable, Rhomberg with EO 5 secs and unable with EC.  GAIT: Distance walked: 30 ft  Assistive device utilized: Single point cane Level of assistance: SBA Comments: Pt unsteady on feet with severe antalgic gait, decreased loading response, absent heel strike and absent toe off gait with uneven stride and decreased speed.  Heavy reliance on the [facility]   -----  03/27/23  Posture: Lumbar lordosis: WNL Iliac crest height: Equal bilaterally Lumbar lateral shift: Mild shift to R to offload LLE  AROM AROM (Normal range in degrees) AROM  Lumbar   Flexion (65) 75% (stopped at 90 deg hip flexion)  Extension (30) 50%  Right lateral flexion (25) 100%  Left lateral flexion (25) 100%  Right rotation (30) 75%  Left rotation (30) 75%      (* = pain; Blank rows = not tested)   Sensation Grossly intact to light touch throughout bilateral LEs as determined by testing dermatomes L2-S2. Proprioception, stereognosis, and hot/cold testing deferred on this date.   Special Tests Lumbar Radiculopathy and Discogenic: Centralization and Peripheralization (SN 92, -LR 0.12): Not examined Slump (SN 83, -LR 0.32): R: Negative L: Negative SLR (SN 92, -LR 0.29): R: Negative L:  Negative  Facet Joint: Extension-Rotation (SN 100, -LR 0.0): R: Negative L: Positive  Lumbar Foraminal Stenosis: Lumbar quadrant (SN 70): R: Negative L: Positive      TODAY'S TREATMENT:                                                                                                                              DATE: 07/17/2023    L hip hemiarthroplasty following L femoral neck fracture (DOS: 10/07/22)   SUBJECTIVE STATEMENT:  Pt has upcoming bone scan for Friday 07/20/23. Patient has last visit under current authorization for 07/19/23. Patient reports no pain in non-weightbearing position, but he has increase in groin and anterior hip pain with higher volume of walking/weightbearing. Pt is compliant with HEP. He feels he does okay with stretches at home, but sometimes they are difficult to complete due to hip discomfort.    Today's Vitals   07/17/23 1434  BP: (!) 143/84  Pulse: 64  SpO2: 100%       There is no height or weight on file to calculate BMI.    Therapeutic Exercise - for improved soft tissue flexibility and  extensibility as needed for ROM, improved strength as needed to improve performance of CKC activities/functional movements   Nu-Step L3. Seat at 12 to maintain posterior hip flexion precautions. 5 minutes, for warm up  Alternating single-limb bridge; 2x6 alternating R/L  Dynamic march, blue agility ladder; x4 D/B, 4-lb ankle weights   Forward hurdle step-over, heel to toe; 1x10 ea LE, over 6-inch hurdle on floor  Alternating lateral step up to Airex pad; 2x10 alternating R/L  Semitandem stance on Airex; 2 x 30 sec each position  Single leg stance; multiple attempts with up to 10 sec attained on surgical LE   PATIENT EDUCATION: discussed current progress with PT and expectations at this time post-operatively.     *not today* Hip flexor stretch, opposite foot on second step; x20, 3 sec hold, dynamic stretch; 3-way hip, standing; 1x5 ea dir, bilat Sit to stand, Airex on seat for inc seat height and Airex under feet; 2x8 Sidelying hip abduction, with 2-lb ankle weight; 2x8 Butterfly stretch; 2 x 30 sec hold  Cold pack (unbilled) - for anti-inflammatory and analgesic effect during e-stim as needed for reduced pain and improved ability to  participate in active PT intervention, along L anterior in supine, x 5 minutes Forward monster walk (forward step + abduction) with Red Tband; 5x D/B length of // bars Heel walk , in // bars; x2 D/B; limited ability to maintain active dorsiflexion with stepping in bars Standing heel raise/toe raise; 2x15 alternating  Single limb stance; multiple attempts with up to 10 sec attained on each LE, in // bars Hurdle step over 6-inch hurdle with focus on heel strike and heel to toe progression; 2 x10 ea LE Sidestep with Blue Tband superior to patellae; 4x D/B length of // bars    *not today* Lateral step up to 6-inch step; staircase in center of gym; 1x12  -pt has limited tolerance of CKC loading with vaulting off of LE on floor and lateral flexion to L  when weight shifting to LLE Glute bridge with Green physioball under calves; 3x10 Manual prone quad stretch; 2 x 30 sec Prone quad stretch with bedsheet looped around ankle; x 30 sec  -for HEP demo  -pt has hamstring cramp when attempted gactive prone knee flexion hampering initiating of prone quad stretch Butt kicker along blue agility ladder; 2x D/B length of ladder for active quad/hip flexor mobility work and weight shifting Side steps along blue agility ladder: x2 D/B with Blue Tband looped above patellae Alternating arms and legs with abdominal brace with Green physioball; x20 alternating R/L Forward step-up to opposite LE tap on next step; 2x10, 6-inch step (staircase center of clinic) Alternating hip flexion with Tband, in standing today; 2x8 alternating R/L with Red Tband Forward step over (3) 6-inch hurdles and (2) 12-inch hurdles, unsupported in open gym environment; 3x D/B with reciprocal patternProne hip extension, alternating; 2x10 alt; 2-lb ankle weights Butterfly stretch, technique reviewed for HEP Modified Thomas hip flexor stretch, technique reviewed for HEP Forward step up to Airex pad; 1x15 surgical LE leading  Prone knee bend, dynamic stretch; 1x10, 1 sec; for review Toe tapping; 2x10 alternating R/L with 12-inch step, no UE support; stance on Airex padThomas hip flexor stretch; reviewed Butterfly stretch in supine; reviewed Manual hip flexor stretch in R sidelying, gentle (hip to neutral); 2 x 30 sec Standing march with 5-lb ankle weights; 2 x 10 alternating, holding treadmill armrest Sit to stand: 1x6 and 1x4; from chair with 8-lb Goblet hold  Unipedal stance; 2 x 15 sec Bridge 3x12:  Black Tband at distal femurs with good carryover in form/technique.  Butterfly stretch; 3 x 20 sec        PATIENT EDUCATION:  Education details: see above for patient education details Person educated: Patient Education method: Explanation and Demonstration Education comprehension:  verbalized understanding   HOME EXERCISE PROGRAM: Access Code: [email or web address] URL: [email or web address] Date: 04/18/2023 Prepared by: Consuela Mimes  Exercises - Sit to Stand  - 1 x daily - 7 x weekly - 2 sets - 10 reps - Supine Bridge with Resistance Band  - 1 x daily - 7 x weekly - 2 sets - 10 reps - Sidelying Hip Abduction  - 1 x daily - 7 x weekly - 2 sets - 10 reps - Prone Hip Extension  - 1 x daily - 7 x weekly - 2 sets - 10 reps - Supine Hip Flexion with Resistance Loop  - 1 x daily - 7 x weekly - 2 sets - 10 reps - Prone Femoral Nerve Mobilization  - 2 x daily - 7 x weekly - 2 sets - 10 reps - 1sec hold  ASSESSMENT:   CLINICAL IMPRESSION:  Patient exhibits markedly improved gait pattern with no significant toe-in during observation today and pt demonstrating no significant asymmetry with eccentric control of dorsiflexors following heel strike. We are able to continue hip strengthening and gradual progression of balance work with pt taking seated break in between exercises prn. Pt has fortunately improved with many factors through POC (hip ROM, wean from AD, gait quality, LE strength, TUG, gait distance with no AD), but he has persistent groin pain with higher volume of stepping/walking limiting his return to full workday (pt is on half day at this time). Pt is continuing diagnostic workup per referring physician to determine causes of ongoing issues with post-operative hip. Pt has remaining deficits in hip flexor, quadriceps, and adductor flexibility; hip ABD/EXT strength, weightbearing tolerance, gait deviations (mild R trunk lateral flexion > L during contralateral swing phase/gluteus medius gait), and limitations with negotiating steps/curbs/uneven terrain. Patient will benefit from continued skilled therapeutic intervention to address the remaining deficits in hip strength, mobility, and weightbearing tolerance as needed for improved function and ability to perform work  duties.      OBJECTIVE IMPAIRMENTS: Abnormal gait, decreased activity tolerance, decreased balance, decreased endurance, decreased knowledge of condition, decreased knowledge of use of DME, decreased mobility, difficulty walking, decreased ROM, decreased strength, and dizziness.    ACTIVITY LIMITATIONS: carrying, lifting, bending, sitting, standing, squatting, sleeping, stairs, transfers, dressing, and locomotion level   PARTICIPATION LIMITATIONS: cleaning, shopping, community activity, occupation, and yard work   PERSONAL FACTORS:  Pt is impulsive and eager to get better so that he can return to work.   are also affecting patient's functional outcome.    REHAB POTENTIAL: Good   CLINICAL DECISION MAKING: Stable/uncomplicated   EVALUATION COMPLEXITY: Low     GOALS: Goals reviewed with patient? Yes   SHORT TERM GOALS: Target date: 12/22/2022 Pt will improve L hip ROM to 10 to 90 degrees without pain in order to promote normal gait and stability.  Baseline: 20 degree Hip felx and 0 deg Ext 12/04/22: 90 deg hip flex and 5 deg EXT 12/18/22: 90 deg hip flex and 10 deg hip EXT Goal status: ACHIEVED    2.  Pt will be able to ambulate 540ft safely with Cane without fall and pain . Baseline: 30 ft with SBA and heavy reliance on [facility].   12/04/22: Pt uses SPC for primary means of mobility in home and community at this time.  Goal status: ACHIEVED       LONG TERM GOALS: Target date: 01/26/2023   Pt will Improve TUG time to <10secs without SPC to demonstrate decreased fall risk.  Baseline: 19 secs with SPC 12/04/22: 14.6 seconds with SPC 12/18/22: 9.5 sec Goal status: ACHIEVED   2.  Pt will Improve LEFS score to >65/80 to demonstrate improved QoL Baseline: 18/80 12/04/22: 20/80  12/18/22: 22/80 01/29/23: 25/80  03/15/23: 24/80 04/09/23: 27/80  04/30/23: 26/80 05/16/23:  19/80 07/10/23: 23/80 Goal status: NOT MET   3.  Pt will be able to ambulate Independently  with LRAD without LOB and pain in  order to return to community level mobility safely  Baseline: With [facility] and FWW.   12/04/22: ModI ambulation with SPC/LRAD with mild pain at longer distances, no LOB 12/18/22: Amb IND with SPC with no LOB Goal status: ACHIEVED    4.  Pt will score >50 on FOTO Baseline: 36/56 12/04/22: 35/56 12/18/22: 37/56 01/29/23: 43/56 03/15/23: 38/56 04/09/23: 43/56 04/30/23: 41/56 05/16/23:  16/10 07/10/23: 96/04  Goal status: NOT MET   5.  Pt will demonstrate 4/5 L hip strength without pain to improve stability and safety with all functional mobility in order to return to work.  Baseline: 2/5 grossly    12/04/22: 4-/5 grossly 12/18/22: 4-/5 grossly 01/29/23: 4/5 for hip flexors/quads/HS, 3+/5 for hip ABD and EXT.  03/15/23: Remaining weakness in hip flexors, quads, abductors, extensors.  04/09/23: Improved strength with pt up to at least 4/5, pain with loading hip flexors and abductors  04/30/23: Weak hip abduction and extension MMT with heightened pain; good strength otherwise 05/16/23: Weak hip flexion, abduction, extension; weak quads; pain with hip flexor and gluteal MMTs 07/10/23: Weakness primarily remaining in hip abduction only, pain with loading gluteal mm Goal status: IN PROGRESS        PLAN:   PT FREQUENCY: 2x/week   PT DURATION: 4 weeks   PLANNED INTERVENTIONS: Therapeutic exercises, Therapeutic activity, Neuromuscular re-education, Balance training, Gait training, Patient/Family education, Self Care, and Joint mobilization   PLAN FOR NEXT SESSION: Continue with strategies for pain modulation prn for L quad/hip flexor region and progress trunk stabilization and hip strengthening as able (focus on hip abductor/extensor strengthening); progressive weightbearing activity as tolerated.     Consuela Mimes, PT, DPT [phone] Physical Therapist- [facility]  07/17/2023, 2:59 PM

## 2023-07-19 ENCOUNTER — Ambulatory Visit: Payer: Worker's Compensation | Admitting: Physical Therapy

## 2023-07-19 VITALS — BP 124/89 | HR 82

## 2023-07-19 DIAGNOSIS — R262 Difficulty in walking, not elsewhere classified: Secondary | ICD-10-CM

## 2023-07-19 DIAGNOSIS — Z96642 Presence of left artificial hip joint: Secondary | ICD-10-CM | POA: Diagnosis not present

## 2023-07-19 DIAGNOSIS — R29898 Other symptoms and signs involving the musculoskeletal system: Secondary | ICD-10-CM

## 2023-07-19 DIAGNOSIS — M25552 Pain in left hip: Secondary | ICD-10-CM

## 2023-07-19 DIAGNOSIS — R2689 Other abnormalities of gait and mobility: Secondary | ICD-10-CM

## 2023-07-19 NOTE — Therapy (Signed)
OUTPATIENT PHYSICAL THERAPY TREATMENT/GOAL UPDATE    Patient Name: Christian Hughes MRN: [id number] DOB:[birth year], 62 y.o., male Today's Date: 07/19/2023   END OF SESSION:   PT End of Session - 07/19/23 1419     Visit Number 53    Number of Visits 58    Date for PT Re-Evaluation 02/28/23    Authorization Type Workers Compensation    Progress Note Due on Visit 50    PT Start Time 1422    PT Stop Time 1508    PT Time Calculation (min) 46 min    Equipment Utilized During Treatment Gait belt    Activity Tolerance Patient tolerated treatment well;Patient limited by pain    Behavior During Therapy WFL for tasks assessed/performed;Impulsive               Past Medical History:  Diagnosis Date   Engages in vaping    H/O ventral hernia    S/p of repair   Hypertension Early 2010's   Marijuana use    Orthostatic dizziness 10/08/2022   Past Surgical History:  Procedure Laterality Date   HIP ARTHROPLASTY Left 10/07/2022   Procedure: ARTHROPLASTY BIPOLAR HIP (HEMIARTHROPLASTY);  Surgeon: Juanell Fairly, MD;  Location: [facility] ORS;  Service: Orthopedics;  Laterality: Left;   JOINT REPLACEMENT  10/06/2022   Left Hip Replacement due fall while at work   Ventral hernia repair     Patient Active Problem List   Diagnosis Date Noted   Asymptomatic hypertensive urgency 04/05/2023   Osteopenia determined by x-ray 02/12/2023   Hyperlipidemia 01/10/2023   Prediabetes 01/10/2023   Primary hypertension 01/10/2023   Left ventricular hypertrophy 01/10/2023   Abnormal EKG 10/06/2022   Bone lesion_sclerotic lesions in the right iliac bone 10/06/2022    PCP: Dewaine Oats MD  REFERRING PROVIDER: Juanell Fairly MD   REFERRING DIAG: [id number] (ICD-10-CM) - Presence of left artificial hip joint   THERAPY DIAG:  History of left hip hemiarthroplasty  Pain in left hip  Weakness of left leg  Balance problems  Difficulty in walking, not elsewhere classified  Rationale for Evaluation  and Treatment Rehabilitation  PERTINENT HISTORY: From initial eval: Christian Hughes is a 62 y.o. male without significant past medical history except for occasional vaping amd marijuana use, who presents with fall and left hip pain.  Pt states that he slipped and fell at work at about 11:00 AM.  Denies loss of consciousness.  He injured his left hip, causing left hip pain, which is constant, sharp, severe, nonradiating, aggravated by movement.  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    "My mom took care of me after my hip surgery. I had therapy at home. I returned to my home alone after my first follow up appointment. I have pain in my buttocks, groin and the corner of my buttocks of about 2 to 3/10. I have high pain tolerance. I am unable to walk normal and I feel weak. I began to use this cane on my own after my PT at home stopped.  I can't wait to go to work because, I love to work."   PAIN:  Are you having pain? Yes: Pain location: 2-3/10 Pain description: stabbing Aggravating factors: Moving, walking, WB   Relieving factors: rest, OTC meds, ice    WEIGHT BEARING RESTRICTIONS: Yes:  WBAT   FALLS:  Has patient fallen in last 6  months? Yes. 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 Pt negotiates some small staircases in town. Pt has historically taken 24-packs of water bottles to his work for customers.    PLOF: Independent   PATIENT GOALS: "I want to return to work without hurting and falling as soon as I  can."   PRECAUTIONS: Posterior hip precautions      OBJECTIVE: (objective measures completed at initial evaluation unless otherwise dated)  PALPATION: Pain in L sacral border, Iliac crest, gluteus medius, TFL, and greater trochanter     LOWER EXTREMITY ROM:    Active ROM Right eval Left eval  Hip flexion WFL 20 deg with pain  Hip extension [facility]    Hip abduction WFL 20 with pain   Hip adduction [facility]    Hip internal rotation [facility]    Hip external rotation [facility]    Knee flexion [facility]  Knee extension [facility]  Ankle dorsiflexion [facility]  Ankle plantarflexion [facility]  Ankle inversion [facility]  Ankle eversion WFL WFL   (Blank rows = not tested)   LOWER EXTREMITY MMT:   MMT Right eval Left eval Left 04/11/23 Left 04/30/23 Left 05/16/23 Left 07/10/23 Left 07/19/23  Hip flexion WFL 2+ 4* 4+ 4* 4+ 4+*  Hip extension WFL 2 4 3+* 4-* 4* 4*  Hip abduction WFl 2 4* 3+* 3+* 4-* 4-*  Hip adduction WFl         Hip internal rotation [facility]         Hip external rotation [facility]         Knee flexion WFL 3- 5 5 4+ 5 5  Knee extension WFl 3 4+ 5 4* 5- 4*  Ankle dorsiflexion WFL 3+       Ankle plantarflexion WFL 4       Ankle inversion WFL 4       Ankle eversion WFL 3-        (Blank rows = not tested) Pt's L ankle remains inverted 10 degrees since the Surgery.     FUNCTIONAL TESTS (INITIAL EVALUATION) 30 seconds chair stand test: 5 reps completed Timed up and go (TUG): 19secs 10 meter walk test: 12secs= 1,2 m/secs             Balance Test : SL standing unable, Modified tandem 10 secs, Tandem Unable, Rhomberg with EO 5 secs and unable with EC.  GAIT: Distance walked: 30 ft  Assistive device utilized: Single point cane Level of assistance: SBA Comments: Pt unsteady on feet with severe antalgic gait, decreased loading response, absent heel strike and absent toe off gait with uneven stride and decreased speed. Heavy reliance on the [facility]   -----  03/27/23  Posture: Lumbar lordosis:  WNL Iliac crest height: Equal bilaterally Lumbar lateral shift: Mild shift to R to offload LLE  AROM AROM (Normal range in degrees) AROM   Lumbar   Flexion (65) 75% (stopped at 90 deg hip flexion)  Extension (30) 50%  Right lateral flexion (25) 100%  Left lateral flexion (25) 100%  Right rotation (30) 75%  Left rotation (30) 75%      (* =  pain; Blank rows = not tested)   Sensation Grossly intact to light touch throughout bilateral LEs as determined by testing dermatomes L2-S2. Proprioception, stereognosis, and hot/cold testing deferred on this date.   Special Tests Lumbar Radiculopathy and Discogenic: Centralization and Peripheralization (SN 92, -LR 0.12): Not examined Slump (SN 83, -LR 0.32): R: Negative L: Negative SLR (SN 92, -LR 0.29): R: Negative L:  Negative  Facet Joint: Extension-Rotation (SN 100, -LR 0.0): R: Negative L: Positive  Lumbar Foraminal Stenosis: Lumbar quadrant (SN 70): R: Negative L: Positive      TODAY'S TREATMENT:                                                                                                                              DATE: 07/19/2023   L hip hemiarthroplasty following L femoral neck fracture (DOS: 10/07/22)   SUBJECTIVE STATEMENT:  Pt has upcoming bone scan for Friday 07/20/23. Patient currently reports sensation of "something sticking" into medial groin region. Patient reports 2/10 NPRS. Patient reports doing better with corrected gait pattern. He reports working regularly on exercises and stepping exercise to improve gait pattern.    Today's Vitals   07/19/23 1424  BP: 124/89  Pulse: 82  SpO2: 99%     There is no height or weight on file to calculate BMI.    Therapeutic Exercise - for improved soft tissue flexibility and extensibility as needed for ROM, improved strength as needed to improve performance of CKC activities/functional movements  *Update for FOTO and strength testing performed   Nu-Step L3. Seat  at 12 to maintain posterior hip flexion precautions. 5 minutes, for warm up  Alternating single-limb bridge; reviewed for HEP  Consecutive forward hurdle step-over, heel to toe; 2x10 ea LE, over 6-inch hurdle on floor  Alternating lateral step up to Airex pad; 2x10 alternating R/L  Semitandem stance on Airex; 2 x 30 sec each position  Single leg stance; multiple attempts with up to 10 sec attained on surgical LE   PATIENT EDUCATION: discussed current progress with PT and expectations at this time post-operatively.     *not today* Dynamic march, blue agility ladder; x4 D/B, 4-lb ankle weights  Hip flexor stretch, opposite foot on second step; x20, 3 sec hold, dynamic stretch; 3-way hip, standing; 1x5 ea dir, bilat Sit to stand, Airex on seat for inc seat height and Airex under feet; 2x8 Sidelying hip abduction, with 2-lb ankle weight; 2x8 Butterfly stretch; 2 x 30 sec hold  Cold pack (unbilled) - for anti-inflammatory and analgesic effect during e-stim as needed for reduced pain and improved ability to participate in active PT intervention, along L anterior in supine, x 5 minutes Forward monster walk (forward step + abduction) with Red Tband; 5x D/B length of // bars Heel walk , in // bars; x2 D/B; limited ability to maintain active dorsiflexion with stepping in bars Standing heel raise/toe raise; 2x15 alternating  Single limb stance; multiple attempts with up  to 10 sec attained on each LE, in // bars Hurdle step over 6-inch hurdle with focus on heel strike and heel to toe progression; 2 x10 ea LE Sidestep with Blue Tband superior to patellae; 4x D/B length of // bars    *not today* Lateral step up to 6-inch step; staircase in center of gym; 1x12  -pt has limited tolerance of CKC loading with vaulting off of LE on floor and lateral flexion to L when weight shifting to LLE Glute bridge with Green physioball under calves; 3x10 Manual prone quad stretch; 2 x 30 sec Prone quad  stretch with bedsheet looped around ankle; x 30 sec  -for HEP demo  -pt has hamstring cramp when attempted gactive prone knee flexion hampering initiating of prone quad stretch Butt kicker along blue agility ladder; 2x D/B length of ladder for active quad/hip flexor mobility work and weight shifting Side steps along blue agility ladder: x2 D/B with Blue Tband looped above patellae Alternating arms and legs with abdominal brace with Green physioball; x20 alternating R/L Forward step-up to opposite LE tap on next step; 2x10, 6-inch step (staircase center of clinic) Alternating hip flexion with Tband, in standing today; 2x8 alternating R/L with Red Tband Forward step over (3) 6-inch hurdles and (2) 12-inch hurdles, unsupported in open gym environment; 3x D/B with reciprocal patternProne hip extension, alternating; 2x10 alt; 2-lb ankle weights Butterfly stretch, technique reviewed for HEP Modified Thomas hip flexor stretch, technique reviewed for HEP Forward step up to Airex pad; 1x15 surgical LE leading  Prone knee bend, dynamic stretch; 1x10, 1 sec; for review Toe tapping; 2x10 alternating R/L with 12-inch step, no UE support; stance on Airex padThomas hip flexor stretch; reviewed Butterfly stretch in supine; reviewed Manual hip flexor stretch in R sidelying, gentle (hip to neutral); 2 x 30 sec Standing march with 5-lb ankle weights; 2 x 10 alternating, holding treadmill armrest Sit to stand: 1x6 and 1x4; from chair with 8-lb Goblet hold  Unipedal stance; 2 x 15 sec Bridge 3x12:  Black Tband at distal femurs with good carryover in form/technique.  Butterfly stretch; 3 x 20 sec        PATIENT EDUCATION:  Education details: see above for patient education details Person educated: Patient Education method: Explanation and Demonstration Education comprehension: verbalized understanding   HOME EXERCISE PROGRAM: Access Code: [email or web address] URL: [email or web address] Date:  07/19/2023 Prepared by: Consuela Mimes  Exercises - Sit to Stand  - 1 x daily - 7 x weekly - 2 sets - 10 reps - Supine Bridge with Knee Extension  - 1 x daily - 7 x weekly - 2 sets - 10 reps - Sidelying Hip Abduction  - 1 x daily - 7 x weekly - 2 sets - 10 reps - Prone Hip Extension  - 1 x daily - 7 x weekly - 2 sets - 10 reps - Standing 3-Way Kick  - 1 x daily - 7 x weekly - 2 sets - 5-8 reps - Forward Step Over with Counter Support  - 1 x daily - 7 x weekly - 2 sets - 10 reps - Standing Romberg to 1/2 Tandem Stance  - 1 x daily - 7 x weekly - 3 sets - 30sec hold     ASSESSMENT:   CLINICAL IMPRESSION:  Patient has struggled to meet long-term goals, particularly with survey-based outcome measures assessing ADLs and participation in life roles (LEFS and FOTO). Pt has made progress with strength, but he is most limited with hip  abductor weakness persisting in spite of home program and intensive progressive resistance exercise focused on gluteal strengthening. Pt has chronic medial groin pain that has persisted throughout POC in spite of attempts at manual therapy, use of modalities, progressive exercise, substantial time post-operatively, and stretching program. Per MD's orders, pt will undergo further diagnostic testing to investigate cause of ongoing pain. Pt does need further work on restoration of strength and endurance for higher volume of weightbearing activity needed for moving about his office/work facility during workday. Further PT POC is pending diagnostic testing and follow-up with physician.       OBJECTIVE IMPAIRMENTS: Abnormal gait, decreased activity tolerance, decreased balance, decreased endurance, decreased knowledge of condition, decreased knowledge of use of DME, decreased mobility, difficulty walking, decreased ROM, decreased strength, and dizziness.    ACTIVITY LIMITATIONS: carrying, lifting, bending, sitting, standing, squatting, sleeping, stairs, transfers, dressing,  and locomotion level   PARTICIPATION LIMITATIONS: cleaning, shopping, community activity, occupation, and yard work   PERSONAL FACTORS:  Pt is impulsive and eager to get better so that he can return to work.   are also affecting patient's functional outcome.    REHAB POTENTIAL: Good   CLINICAL DECISION MAKING: Stable/uncomplicated   EVALUATION COMPLEXITY: Low     GOALS: Goals reviewed with patient? Yes   SHORT TERM GOALS: Target date: 12/22/2022 Pt will improve L hip ROM to 10 to 90 degrees without pain in order to promote normal gait and stability.  Baseline: 20 degree Hip felx and 0 deg Ext 12/04/22: 90 deg hip flex and 5 deg EXT 12/18/22: 90 deg hip flex and 10 deg hip EXT Goal status: ACHIEVED    2.  Pt will be able to ambulate 581ft safely with Cane without fall and pain . Baseline: 30 ft with SBA and heavy reliance on [facility].   12/04/22: Pt uses SPC for primary means of mobility in home and community at this time.  Goal status: ACHIEVED       LONG TERM GOALS: Target date: 01/26/2023   Pt will Improve TUG time to <10secs without SPC to demonstrate decreased fall risk.  Baseline: 19 secs with SPC 12/04/22: 14.6 seconds with SPC 12/18/22: 9.5 sec Goal status: ACHIEVED   2.  Pt will Improve LEFS score to >65/80 to demonstrate improved QoL Baseline: 18/80 12/04/22: 20/80  12/18/22: 22/80 01/29/23: 25/80  03/15/23: 24/80 04/09/23: 27/80  04/30/23: 26/80 05/16/23:  19/80 07/10/23: 23/80 Goal status: NOT MET   3.  Pt will be able to ambulate Independently  with LRAD without LOB and pain in order to return to community level mobility safely  Baseline: With [facility] and FWW.   12/04/22: ModI ambulation with SPC/LRAD with mild pain at longer distances, no LOB 12/18/22: Amb IND with SPC with no LOB Goal status: ACHIEVED    4.  Pt will score >50 on FOTO Baseline: 36/56 12/04/22: 35/56 12/18/22: 37/56 01/29/23: 43/56 03/15/23: 38/56 04/09/23: 43/56 04/30/23: 41/56 05/16/23:  38/56 07/10/23:  40/56 07/19/23: 40/56 Goal status: NOT MET   5.  Pt will demonstrate 4/5 L hip strength without pain to improve stability and safety with all functional mobility in order to return to work.  Baseline: 2/5 grossly    12/04/22: 4-/5 grossly 12/18/22: 4-/5 grossly 01/29/23: 4/5 for hip flexors/quads/HS, 3+/5 for hip ABD and EXT.  03/15/23: Remaining weakness in hip flexors, quads, abductors, extensors.  04/09/23: Improved strength with pt up to at least 4/5, pain with loading hip flexors and abductors  04/30/23: Weak hip  abduction and extension MMT with heightened pain; good strength otherwise 05/16/23: Weak hip flexion, abduction, extension; weak quads; pain with hip flexor and gluteal MMTs 07/10/23: Weakness primarily remaining in hip abduction only, pain with loading gluteal mm 07/19/23: Met for all with exception of hip abduction  Goal status: IN PROGRESS/MOSTLY MET         PLAN:   PT FREQUENCY: 2x/week   PT DURATION: 4 weeks   PLANNED INTERVENTIONS: Therapeutic exercises, Therapeutic activity, Neuromuscular re-education, Balance training, Gait training, Patient/Family education, Self Care, and Joint mobilization   PLAN FOR NEXT SESSION: Continue with graded exposure to weightbearing activity, strategies for pain modulation prn. F/u pending further diagnostic workup per physician.     Consuela Mimes, PT, DPT [phone] Physical Therapist- [facility]  07/19/2023, 3:08 PM

## 2023-07-20 ENCOUNTER — Encounter
Admission: RE | Admit: 2023-07-20 | Discharge: 2023-07-20 | Disposition: A | Discharge status: Home / Self Care | Payer: Worker's Compensation | Source: Ambulatory Visit | Attending: Orthopedic Surgery | Admitting: Orthopedic Surgery

## 2023-07-20 DIAGNOSIS — M25552 Pain in left hip: Secondary | ICD-10-CM | POA: Diagnosis present

## 2023-07-20 MED ORDER — TECHNETIUM TC 99M MEDRONATE IV KIT
20.0000 | PACK | Freq: Once | INTRAVENOUS | Status: AC | PRN
  Administered 2023-07-20: 22.76 via INTRAVENOUS

## 2023-07-20 MED ORDER — TECHNETIUM TC 99M MEDRONATE IV KIT: 20.0000 | PACK | Freq: Once | INTRAVENOUS | Status: DC | PRN

## 2023-08-02 ENCOUNTER — Ambulatory Visit: Payer: Worker's Compensation | Attending: Orthopedic Surgery | Admitting: Physical Therapy

## 2023-08-02 ENCOUNTER — Other Ambulatory Visit: Payer: Self-pay | Admitting: Physician Assistant

## 2023-08-02 VITALS — BP 147/74 | HR 72

## 2023-08-02 DIAGNOSIS — R262 Difficulty in walking, not elsewhere classified: Secondary | ICD-10-CM | POA: Diagnosis present

## 2023-08-02 DIAGNOSIS — R29898 Other symptoms and signs involving the musculoskeletal system: Secondary | ICD-10-CM | POA: Insufficient documentation

## 2023-08-02 DIAGNOSIS — R2689 Other abnormalities of gait and mobility: Secondary | ICD-10-CM | POA: Insufficient documentation

## 2023-08-02 DIAGNOSIS — Z96642 Presence of left artificial hip joint: Secondary | ICD-10-CM | POA: Diagnosis present

## 2023-08-02 DIAGNOSIS — M25552 Pain in left hip: Secondary | ICD-10-CM | POA: Insufficient documentation

## 2023-08-02 DIAGNOSIS — E7800 Pure hypercholesterolemia, unspecified: Secondary | ICD-10-CM

## 2023-08-02 NOTE — Therapy (Signed)
 OUTPATIENT PHYSICAL THERAPY TREATMENT    Patient Name: Christian Hughes MRN: [id number] DOB:[birth year], 63 y.o., male Today's Date: 08/02/2023   END OF SESSION:   PT End of Session - 08/02/23 1608     Visit Number 54    Number of Visits 58    Date for PT Re-Evaluation 02/28/23    Authorization Type Workers Compensation    Progress Note Due on Visit 50    PT Start Time 1605    PT Stop Time 1645    PT Time Calculation (min) 40 min    Equipment Utilized During Treatment Gait belt    Activity Tolerance Patient tolerated treatment well;Patient limited by pain    Behavior During Therapy WFL for tasks assessed/performed;Impulsive                Past Medical History:  Diagnosis Date   Engages in vaping    H/O ventral hernia    S/p of repair   Hypertension Early 2010's   Marijuana use    Orthostatic dizziness 10/08/2022   Past Surgical History:  Procedure Laterality Date   HIP ARTHROPLASTY Left 10/07/2022   Procedure: ARTHROPLASTY BIPOLAR HIP (HEMIARTHROPLASTY);  Surgeon: Marchia Drivers, MD;  Location: [facility] ORS;  Service: Orthopedics;  Laterality: Left;   JOINT REPLACEMENT  10/06/2022   Left Hip Replacement due fall while at work   Ventral hernia repair     Patient Active Problem List   Diagnosis Date Noted   Asymptomatic hypertensive urgency 04/05/2023   Osteopenia determined by x-ray 02/12/2023   Hyperlipidemia 01/10/2023   Prediabetes 01/10/2023   Primary hypertension 01/10/2023   Left ventricular hypertrophy 01/10/2023   Abnormal EKG 10/06/2022   Bone lesion_sclerotic lesions in the right iliac bone 10/06/2022    PCP: Honor Cork MD  REFERRING PROVIDER: Drivers Marchia MD   REFERRING DIAG: [phone] (ICD-10-CM) - Presence of left artificial hip joint   THERAPY DIAG:  History of left hip hemiarthroplasty  Pain in left hip  Weakness of left leg  Balance problems  Difficulty in walking, not elsewhere classified  Rationale for Evaluation and Treatment  Rehabilitation  PERTINENT HISTORY: From initial eval: Christian Hughes is a 63 y.o. male without significant past medical history except for occasional vaping amd marijuana use, who presents with fall and left hip pain.  Pt states that he slipped and fell at work at about 11:00 AM.  Denies loss of consciousness.  He injured his left hip, causing left hip pain, which is constant, sharp, severe, nonradiating, aggravated by movement.  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    My mom took care of me after my hip surgery. I had therapy at home. I returned to my home alone after my first follow up appointment. I have pain in my buttocks, groin and the corner of my buttocks of about 2 to 3/10. I have high pain tolerance. I am unable to walk normal and I feel weak. I began to use this cane on my own after my PT at home stopped.  I can't wait to go to work because, I love to work.   PAIN:  Are you having pain? Yes: Pain location: 2-3/10 Pain description: stabbing Aggravating factors: Moving, walking, WB   Relieving factors: rest, OTC meds, ice    WEIGHT BEARING RESTRICTIONS: Yes:  WBAT   FALLS:  Has patient fallen in last 6  months? Yes. 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 Pt negotiates some small staircases in town. Pt has historically taken 24-packs of water bottles to his work for customers.    PLOF: Independent   PATIENT GOALS: I want to return to work without hurting and falling as soon as I can.   PRECAUTIONS:  Posterior hip precautions      OBJECTIVE: (objective measures completed at initial evaluation unless otherwise dated)  PALPATION: Pain in L sacral border, Iliac crest, gluteus medius, TFL, and greater trochanter     LOWER EXTREMITY ROM:    Active ROM Right eval Left eval  Hip flexion WFL 20 deg with pain  Hip extension [facility]    Hip abduction WFL 20 with pain   Hip adduction [facility]    Hip internal rotation [facility]    Hip external rotation [facility]    Knee flexion [facility]  Knee extension [facility]  Ankle dorsiflexion [facility]  Ankle plantarflexion [facility]  Ankle inversion [facility]  Ankle eversion WFL WFL   (Blank rows = not tested)   LOWER EXTREMITY MMT:   MMT Right eval Left eval Left 04/11/23 Left 04/30/23 Left 05/16/23 Left 07/10/23 Left 07/19/23  Hip flexion WFL 2+ 4* 4+ 4* 4+ 4+*  Hip extension WFL 2 4 3+* 4-* 4* 4*  Hip abduction WFl 2 4* 3+* 3+* 4-* 4-*  Hip adduction WFl         Hip internal rotation [facility]         Hip external rotation [facility]         Knee flexion WFL 3- 5 5 4+ 5 5  Knee extension WFl 3 4+ 5 4* 5- 4*  Ankle dorsiflexion WFL 3+       Ankle plantarflexion WFL 4       Ankle inversion WFL 4       Ankle eversion WFL 3-        (Blank rows = not tested) Pt's L ankle remains inverted 10 degrees since the Surgery.     FUNCTIONAL TESTS (INITIAL EVALUATION) 30 seconds chair stand test: 5 reps completed Timed up and go (TUG): 19secs 10 meter walk test: 12secs= 1,2 m/secs             Balance Test : SL standing unable, Modified tandem 10 secs, Tandem Unable, Rhomberg with EO 5 secs and unable with EC.  GAIT: Distance walked: 30 ft  Assistive device utilized: Single point cane Level of assistance: SBA Comments: Pt unsteady on feet with severe antalgic gait, decreased loading response, absent heel strike and absent toe off gait with uneven stride and decreased speed. Heavy reliance on the [facility]   -----  03/27/23  Posture: Lumbar lordosis: WNL Iliac crest height:  Equal bilaterally Lumbar lateral shift: Mild shift to R to offload LLE  AROM AROM (Normal range in degrees) AROM   Lumbar   Flexion (65) 75% (stopped at 90 deg hip flexion)  Extension (30) 50%  Right lateral flexion (25) 100%  Left lateral flexion (25) 100%  Right rotation (30) 75%  Left rotation (30) 75%      (* =  pain; Blank rows = not tested)   Sensation Grossly intact to light touch throughout bilateral LEs as determined by testing dermatomes L2-S2. Proprioception, stereognosis, and hot/cold testing deferred on this date.   Special Tests Lumbar Radiculopathy and Discogenic: Centralization and Peripheralization (SN 92, -LR 0.12): Not examined Slump (SN 83, -LR 0.32): R: Negative L: Negative SLR (SN 92, -LR 0.29): R: Negative L:  Negative  Facet Joint: Extension-Rotation (SN 100, -LR 0.0): R: Negative L: Positive  Lumbar Foraminal Stenosis: Lumbar quadrant (SN 70): R: Negative L: Positive      TODAY'S TREATMENT:                                                                                                                              DATE: 08/02/2023   L hip hemiarthroplasty following L femoral neck fracture (DOS: 10/07/22)   SUBJECTIVE STATEMENT:  Pt reports no discussion yet regarding his recent imaging. Patient reports episode of vomiting and syncopal episode during injection. Pt reports ongoing groin/inner thigh symptoms - worse with cold weather. Patient reports 3/10 pain at arrival this afternoon.    Today's Vitals   08/02/23 1608  BP: (!) 147/74  Pulse: 72  SpO2: 100%      There is no height or weight on file to calculate BMI.    Therapeutic Exercise - for improved soft tissue flexibility and extensibility as needed for ROM, improved strength as needed to improve performance of CKC activities/functional movements   Nu-Step L3. Seat at 12 to maintain posterior hip flexion precautions. 5 minutes, for warm up  3-way hip; x 8 ea dir, bilat LE,  unilateral support on single armrest  Hurdle step over 6-inch hurdle with focus on heel strike and heel to toe progression; 2 x10 ea LE  Lateral step-up, 6-inch step, staircase in center of gym; 2x10  Semitandem stance on Airex; 1 x 30 sec each position  Single leg stance; multiple attempts with up to 18 sec attained on surgical LE   PATIENT EDUCATION: discussed recent scans and needing to discuss finding with surgeon/referring office.     *not today* Consecutive forward hurdle step-over, heel to toe; 2x10 ea LE, over 6-inch hurdle on floor Alternating lateral step up to Airex pad; 2x10 alternating R/L Alternating single-limb bridge; reviewed for HEP Dynamic march, blue agility ladder; x4 D/B, 4-lb ankle weights  Hip flexor stretch, opposite foot on second step; x20, 3 sec hold, dynamic stretch; 3-way hip, standing; 1x5 ea dir, bilat Sit to stand, Airex on seat for inc seat height and Airex under feet; 2x8 Sidelying hip abduction, with 2-lb ankle weight; 2x8 Butterfly stretch; 2 x 30 sec hold  Cold pack (unbilled) - for anti-inflammatory and analgesic effect during e-stim as needed for reduced pain and improved ability to participate in active PT intervention, along L anterior in supine, x 5 minutes Forward monster walk (forward step + abduction) with Red Tband; 5x D/B length of // bars Heel walk ,  in // bars; x2 D/B; limited ability to maintain active dorsiflexion with stepping in bars Standing heel raise/toe raise; 2x15 alternating  Single limb stance; multiple attempts with up to 10 sec attained on each LE, in // bars Sidestep with Blue Tband superior to patellae; 4x D/B length of // bars    *not today* Lateral step up to 6-inch step; staircase in center of gym; 1x12  -pt has limited tolerance of CKC loading with vaulting off of LE on floor and lateral flexion to L when weight shifting to LLE Glute bridge with Green physioball under calves; 3x10 Manual prone quad stretch; 2 x  30 sec Prone quad stretch with bedsheet looped around ankle; x 30 sec  -for HEP demo  -pt has hamstring cramp when attempted gactive prone knee flexion hampering initiating of prone quad stretch Butt kicker along blue agility ladder; 2x D/B length of ladder for active quad/hip flexor mobility work and weight shifting Side steps along blue agility ladder: x2 D/B with Blue Tband looped above patellae Alternating arms and legs with abdominal brace with Green physioball; x20 alternating R/L Forward step-up to opposite LE tap on next step; 2x10, 6-inch step (staircase center of clinic) Alternating hip flexion with Tband, in standing today; 2x8 alternating R/L with Red Tband Forward step over (3) 6-inch hurdles and (2) 12-inch hurdles, unsupported in open gym environment; 3x D/B with reciprocal patternProne hip extension, alternating; 2x10 alt; 2-lb ankle weights Butterfly stretch, technique reviewed for HEP Modified Thomas hip flexor stretch, technique reviewed for HEP Forward step up to Airex pad; 1x15 surgical LE leading  Prone knee bend, dynamic stretch; 1x10, 1 sec; for review Toe tapping; 2x10 alternating R/L with 12-inch step, no UE support; stance on Airex padThomas hip flexor stretch; reviewed Butterfly stretch in supine; reviewed Manual hip flexor stretch in R sidelying, gentle (hip to neutral); 2 x 30 sec Standing march with 5-lb ankle weights; 2 x 10 alternating, holding treadmill armrest Sit to stand: 1x6 and 1x4; from chair with 8-lb Goblet hold  Unipedal stance; 2 x 15 sec Bridge 3x12:  Black Tband at distal femurs with good carryover in form/technique.  Butterfly stretch; 3 x 20 sec        PATIENT EDUCATION:  Education details: see above for patient education details Person educated: Patient Education method: Explanation and Demonstration Education comprehension: verbalized understanding   HOME EXERCISE PROGRAM: Access Code: [email or web address] URL:  [email or web address] Date: 07/19/2023 Prepared by: Venetia Endo  Exercises - Sit to Stand  - 1 x daily - 7 x weekly - 2 sets - 10 reps - Supine Bridge with Knee Extension  - 1 x daily - 7 x weekly - 2 sets - 10 reps - Sidelying Hip Abduction  - 1 x daily - 7 x weekly - 2 sets - 10 reps - Prone Hip Extension  - 1 x daily - 7 x weekly - 2 sets - 10 reps - Standing 3-Way Kick  - 1 x daily - 7 x weekly - 2 sets - 5-8 reps - Forward Step Over with Counter Support  - 1 x daily - 7 x weekly - 2 sets - 10 reps - Standing Romberg to 1/2 Tandem Stance  - 1 x daily - 7 x weekly - 3 sets - 30sec hold     ASSESSMENT:   CLINICAL IMPRESSION:  Patient demonstrates improved quality of lateral step-up with no vaulting or heavy upper limb support until he gets toward end of his sets with onset  of muscular fatigue. He has increased unipedal stance time compared to previous sessions (up to 10 sec obtained before). Pt has ongoing mild to moderate groin and inner thigh discomfort on his surgical side with no positive treatment effect from manual therapy or stretching. Pt responds well in short term with use of modalities (TENS, use of ice or heat); however, groin pain re-occurs with longer-duration walking and weightbearing. Pt is awaiting formal radiology report for his recent imaging and f/u with physician regarding results. Pt does need further work on restoration of strength and endurance for higher volume of weightbearing activity needed for moving about his office/work facility during workday. Further PT POC is pending diagnostic testing and follow-up with physician.       OBJECTIVE IMPAIRMENTS: Abnormal gait, decreased activity tolerance, decreased balance, decreased endurance, decreased knowledge of condition, decreased knowledge of use of DME, decreased mobility, difficulty walking, decreased ROM, decreased strength, and dizziness.    ACTIVITY LIMITATIONS: carrying, lifting, bending,  sitting, standing, squatting, sleeping, stairs, transfers, dressing, and locomotion level   PARTICIPATION LIMITATIONS: cleaning, shopping, community activity, occupation, and yard work   PERSONAL FACTORS:  Pt is impulsive and eager to get better so that he can return to work.   are also affecting patient's functional outcome.    REHAB POTENTIAL: Good   CLINICAL DECISION MAKING: Stable/uncomplicated   EVALUATION COMPLEXITY: Low     GOALS: Goals reviewed with patient? Yes   SHORT TERM GOALS: Target date: 12/22/2022 Pt will improve L hip ROM to 10 to 90 degrees without pain in order to promote normal gait and stability.  Baseline: 20 degree Hip felx and 0 deg Ext 12/04/22: 90 deg hip flex and 5 deg EXT 12/18/22: 90 deg hip flex and 10 deg hip EXT Goal status: ACHIEVED    2.  Pt will be able to ambulate 578ft safely with Cane without fall and pain . Baseline: 30 ft with SBA and heavy reliance on [facility].   12/04/22: Pt uses SPC for primary means of mobility in home and community at this time.  Goal status: ACHIEVED       LONG TERM GOALS: Target date: 01/26/2023   Pt will Improve TUG time to <10secs without SPC to demonstrate decreased fall risk.  Baseline: 19 secs with SPC 12/04/22: 14.6 seconds with SPC 12/18/22: 9.5 sec Goal status: ACHIEVED   2.  Pt will Improve LEFS score to >65/80 to demonstrate improved QoL Baseline: 18/80 12/04/22: 20/80  12/18/22: 22/80 01/29/23: 25/80  03/15/23: 24/80 04/09/23: 27/80  04/30/23: 26/80 05/16/23:  19/80 07/10/23: 23/80 Goal status: NOT MET   3.  Pt will be able to ambulate Independently  with LRAD without LOB and pain in order to return to community level mobility safely  Baseline: With [facility] and FWW.   12/04/22: ModI ambulation with SPC/LRAD with mild pain at longer distances, no LOB 12/18/22: Amb IND with SPC with no LOB Goal status: ACHIEVED    4.  Pt will score >50 on FOTO Baseline: 36/56 12/04/22: 35/56 12/18/22: 37/56 01/29/23: 43/56 03/15/23:  38/56 04/09/23: 43/56 04/30/23: 41/56 05/16/23:  38/56 07/10/23: 40/56 07/19/23: 40/56 Goal status: NOT MET   5.  Pt will demonstrate 4/5 L hip strength without pain to improve stability and safety with all functional mobility in order to return to work.  Baseline: 2/5 grossly    12/04/22: 4-/5 grossly 12/18/22: 4-/5 grossly 01/29/23: 4/5 for hip flexors/quads/HS, 3+/5 for hip ABD and EXT.  03/15/23: Remaining weakness in hip flexors, quads,  abductors, extensors.  04/09/23: Improved strength with pt up to at least 4/5, pain with loading hip flexors and abductors  04/30/23: Weak hip abduction and extension MMT with heightened pain; good strength otherwise 05/16/23: Weak hip flexion, abduction, extension; weak quads; pain with hip flexor and gluteal MMTs 07/10/23: Weakness primarily remaining in hip abduction only, pain with loading gluteal mm 07/19/23: Met for all with exception of hip abduction  Goal status: IN PROGRESS/MOSTLY MET         PLAN:   PT FREQUENCY: 2x/week   PT DURATION: 4 weeks   PLANNED INTERVENTIONS: Therapeutic exercises, Therapeutic activity, Neuromuscular re-education, Balance training, Gait training, Patient/Family education, Self Care, and Joint mobilization   PLAN FOR NEXT SESSION: Continue with graded exposure to weightbearing activity, strategies for pain modulation prn. F/u pending further diagnostic workup per physician; pt still awaiting radiologist's report/physician f/u regarding recent scans.     Venetia Endo, PT, DPT [phone] Physical Therapist- [facility]  08/02/2023, 5:00 PM

## 2023-08-05 ENCOUNTER — Other Ambulatory Visit: Payer: Self-pay | Admitting: Physician Assistant

## 2023-08-05 DIAGNOSIS — I10 Essential (primary) hypertension: Secondary | ICD-10-CM

## 2023-08-07 ENCOUNTER — Ambulatory Visit: Payer: Worker's Compensation | Admitting: Physical Therapy

## 2023-08-07 ENCOUNTER — Encounter: Payer: Self-pay | Admitting: Physical Therapy

## 2023-08-07 VITALS — BP 147/88 | HR 64

## 2023-08-07 DIAGNOSIS — Z96642 Presence of left artificial hip joint: Secondary | ICD-10-CM

## 2023-08-07 DIAGNOSIS — R262 Difficulty in walking, not elsewhere classified: Secondary | ICD-10-CM

## 2023-08-07 DIAGNOSIS — M25552 Pain in left hip: Secondary | ICD-10-CM

## 2023-08-07 DIAGNOSIS — R2689 Other abnormalities of gait and mobility: Secondary | ICD-10-CM

## 2023-08-07 DIAGNOSIS — R29898 Other symptoms and signs involving the musculoskeletal system: Secondary | ICD-10-CM

## 2023-08-07 NOTE — Therapy (Signed)
 OUTPATIENT PHYSICAL THERAPY TREATMENT    Patient Name: Christian Hughes MRN: [id number] DOB:1961/07/16, 63 y.o., male Today's Date: 08/07/2023   END OF SESSION:   PT End of Session - 08/07/23 1601     Visit Number 55    Number of Visits 58    Authorization Type Workers Compensation    Progress Note Due on Visit 50    PT Start Time 1601    PT Stop Time 1646    PT Time Calculation (min) 45 min    Equipment Utilized During Treatment Gait belt    Activity Tolerance Patient tolerated treatment well;Patient limited by pain    Behavior During Therapy WFL for tasks assessed/performed;Impulsive             Past Medical History:  Diagnosis Date   Engages in vaping    H/O ventral hernia    S/p of repair   Hypertension Early 2010's   Marijuana use    Orthostatic dizziness 10/08/2022   Past Surgical History:  Procedure Laterality Date   HIP ARTHROPLASTY Left 10/07/2022   Procedure: ARTHROPLASTY BIPOLAR HIP (HEMIARTHROPLASTY);  Surgeon: Marchia Drivers, MD;  Location: [facility] ORS;  Service: Orthopedics;  Laterality: Left;   JOINT REPLACEMENT  10/06/2022   Left Hip Replacement due fall while at work   Ventral hernia repair     Patient Active Problem List   Diagnosis Date Noted   Asymptomatic hypertensive urgency 04/05/2023   Osteopenia determined by x-ray 02/12/2023   Hyperlipidemia 01/10/2023   Prediabetes 01/10/2023   Primary hypertension 01/10/2023   Left ventricular hypertrophy 01/10/2023   Abnormal EKG 10/06/2022   Bone lesion_sclerotic lesions in the right iliac bone 10/06/2022    PCP: Honor Cork MD  REFERRING PROVIDER: Drivers Marchia MD   REFERRING DIAG: [phone] (ICD-10-CM) - Presence of left artificial hip joint   THERAPY DIAG:  History of left hip hemiarthroplasty  Pain in left hip  Weakness of left leg  Balance problems  Difficulty in walking, not elsewhere classified  Rationale for Evaluation and Treatment Rehabilitation  PERTINENT HISTORY: From  initial eval: Christian Hughes is a 63 y.o. male without significant past medical history except for occasional vaping amd marijuana use, who presents with fall and left hip pain.  Pt states that he slipped and fell at work at about 11:00 AM.  Denies loss of consciousness.  He injured his left hip, causing left hip pain, which is constant, sharp, severe, nonradiating, aggravated by movement.  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    My mom took care of me after my hip surgery. I had therapy at home. I returned to my home alone after my first follow up appointment. I have pain in my buttocks, groin and the corner of my buttocks of about 2 to 3/10. I have high pain tolerance. I am unable to walk normal and I feel weak. I began to use this cane on my own after my PT at home stopped.  I can't wait to go to work because, I love to work.   PAIN:  Are you having pain? Yes: Pain location: 2-3/10 Pain description: stabbing Aggravating factors: Moving, walking, WB   Relieving factors: rest, OTC meds, ice    WEIGHT BEARING RESTRICTIONS: Yes:  WBAT   FALLS:  Has patient fallen in last 6 months? Yes. 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 Pt negotiates some small staircases in town. Pt has historically taken 24-packs of water bottles to his work for customers.    PLOF: Independent   PATIENT GOALS: I want to return to work without hurting and falling as soon as I can.   PRECAUTIONS: Posterior hip precautions     OBJECTIVE:  (objective measures completed at initial evaluation unless otherwise dated)  PALPATION: Pain in L sacral border, Iliac crest, gluteus medius, TFL, and greater trochanter     LOWER EXTREMITY ROM:    Active ROM Right eval Left eval  Hip flexion WFL 20 deg with pain  Hip extension [facility]    Hip abduction WFL 20 with pain   Hip adduction [facility]    Hip internal rotation [facility]    Hip external rotation [facility]    Knee flexion [facility]  Knee extension [facility]  Ankle dorsiflexion [facility]  Ankle plantarflexion [facility]  Ankle inversion [facility]  Ankle eversion WFL WFL   (Blank rows = not tested)   LOWER EXTREMITY MMT:   MMT Right eval Left eval Left 04/11/23 Left 04/30/23 Left 05/16/23 Left 07/10/23 Left 07/19/23  Hip flexion WFL 2+ 4* 4+ 4* 4+ 4+*  Hip extension WFL 2 4 3+* 4-* 4* 4*  Hip abduction WFl 2 4* 3+* 3+* 4-* 4-*  Hip adduction WFl         Hip internal rotation [facility]         Hip external rotation [facility]         Knee flexion WFL 3- 5 5 4+ 5 5  Knee extension WFl 3 4+ 5 4* 5- 4*  Ankle dorsiflexion WFL 3+       Ankle plantarflexion WFL 4       Ankle inversion WFL 4       Ankle eversion WFL 3-        (Blank rows = not tested) Pt's L ankle remains inverted 10 degrees since the Surgery.     FUNCTIONAL TESTS (INITIAL EVALUATION) 30 seconds chair stand test: 5 reps completed Timed up and go (TUG): 19secs 10 meter walk test: 12secs= 1,2 m/secs             Balance Test : SL standing unable, Modified tandem 10 secs, Tandem Unable, Rhomberg with EO 5 secs and unable with EC.  GAIT: Distance walked: 30 ft  Assistive device utilized: Single point cane Level of assistance: SBA Comments: Pt unsteady on feet with severe antalgic gait, decreased loading response, absent heel strike and absent toe off gait with uneven stride and decreased speed. Heavy reliance on the [facility]   -----  03/27/23  Posture: Lumbar lordosis: WNL Iliac crest height: Equal bilaterally Lumbar lateral shift: Mild shift  to R to offload LLE  AROM AROM (Normal range in degrees) AROM   Lumbar   Flexion (65) 75% (stopped at 90 deg hip flexion)  Extension (30) 50%  Right lateral flexion (25) 100%  Left lateral flexion (25) 100%  Right rotation (30) 75%  Left rotation (30) 75%      (* = pain; Blank rows = not tested)  Sensation Grossly intact to light touch throughout bilateral LEs as determined by testing dermatomes L2-S2. Proprioception, stereognosis, and hot/cold testing deferred on this date.   Special Tests Lumbar Radiculopathy and Discogenic: Centralization and Peripheralization (SN 92, -LR 0.12): Not examined Slump (SN 83, -LR 0.32): R: Negative L: Negative SLR (SN 92, -LR 0.29): R: Negative L:  Negative  Facet Joint: Extension-Rotation (SN 100, -LR 0.0): R: Negative L: Positive  Lumbar Foraminal Stenosis: Lumbar quadrant (SN 70): R: Negative L: Positive      TODAY'S TREATMENT:                                                                                                                              DATE: 08/07/2023   L hip hemiarthroplasty following L femoral neck fracture (DOS: 10/07/22)  Next f/u with surgeon: 08/20/23   SUBJECTIVE STATEMENT:  Pt reports severe pain this AM associated with cold inclement weather. It is better now; 2-3/10. Patient reports doing well with intermittent walking with frequent sitting breaks, but he has to sometimes go into banks and spend more time in standing. Pt reports doing well with negotiating steps with using handrails. Pt has next f/u with MD 08/20/23.    Today's Vitals   08/07/23 1604  BP: (!) 147/88  Pulse: 64       There is no height or weight on file to calculate BMI.    Therapeutic Exercise - for improved soft tissue flexibility and extensibility as needed for ROM, improved strength as needed to improve performance of CKC activities/functional movements   Nu-Step L4 for 3.5 minutes; L3 for 2.5 minutes. Seat at 12 to maintain  posterior hip flexion precautions   Physioball bridge, blue physioball under distal calves; 2x10  Open gate, 1x D/B along blue agility ladder  -notable difficulty with successive repetitions with discomfort around L groin  Hip flexor stretch, opposite foot on second step; x20, 3 sec hold, dynamic stretch;  3-way hip; Red Tband around ankles; x 8 ea dir, bilat LE, unilateral support on single armrest  Alternating lateral step up to Airex pad; 2x10 alternating R/L  Semitandem stance on Airex; 1 x 30 sec each position   PATIENT EDUCATION: discussed current options moving forward for symptom modulation and consideration of dry needling pending approval from referring physician   *next visit* Lateral step-up, 6-inch step, staircase in center of gym; 2x10 Single leg stance; multiple attempts with up to 18 sec attained on surgical LE   *not today* Hurdle step over 6-inch hurdle with focus on heel strike and heel to toe progression; 2 x10 ea LE Consecutive forward hurdle step-over, heel to toe; 2x10 ea LE, over 6-inch hurdle on floor Alternating single-limb bridge; reviewed for HEP Dynamic march, blue agility ladder; x4 D/B, 4-lb ankle weights   3-way hip, standing; 1x5 ea dir, bilat Sit to stand, Airex on seat for inc seat height and Airex under feet; 2x8 Sidelying hip abduction, with 2-lb ankle  weight; 2x8 Butterfly stretch; 2 x 30 sec hold  Cold pack (unbilled) - for anti-inflammatory and analgesic effect during e-stim as needed for reduced pain and improved ability to participate in active PT intervention, along L anterior in supine, x 5 minutes Forward monster walk (forward step + abduction) with Red Tband; 5x D/B length of // bars Heel walk , in // bars; x2 D/B; limited ability to maintain active dorsiflexion with stepping in bars Standing heel raise/toe raise; 2x15 alternating  Single limb stance; multiple attempts with up to 10 sec attained on each LE, in // bars Sidestep with  Blue Tband superior to patellae; 4x D/B length of // bars    *not today* Lateral step up to 6-inch step; staircase in center of gym; 1x12  -pt has limited tolerance of CKC loading with vaulting off of LE on floor and lateral flexion to L when weight shifting to LLE Glute bridge with Green physioball under calves; 3x10 Manual prone quad stretch; 2 x 30 sec Prone quad stretch with bedsheet looped around ankle; x 30 sec  -for HEP demo  -pt has hamstring cramp when attempted gactive prone knee flexion hampering initiating of prone quad stretch Butt kicker along blue agility ladder; 2x D/B length of ladder for active quad/hip flexor mobility work and weight shifting Side steps along blue agility ladder: x2 D/B with Blue Tband looped above patellae Alternating arms and legs with abdominal brace with Green physioball; x20 alternating R/L Forward step-up to opposite LE tap on next step; 2x10, 6-inch step (staircase center of clinic) Alternating hip flexion with Tband, in standing today; 2x8 alternating R/L with Red Tband Forward step over (3) 6-inch hurdles and (2) 12-inch hurdles, unsupported in open gym environment; 3x D/B with reciprocal patternProne hip extension, alternating; 2x10 alt; 2-lb ankle weights Butterfly stretch, technique reviewed for HEP Modified Thomas hip flexor stretch, technique reviewed for HEP Forward step up to Airex pad; 1x15 surgical LE leading  Prone knee bend, dynamic stretch; 1x10, 1 sec; for review Toe tapping; 2x10 alternating R/L with 12-inch step, no UE support; stance on Airex padThomas hip flexor stretch; reviewed Butterfly stretch in supine; reviewed Manual hip flexor stretch in R sidelying, gentle (hip to neutral); 2 x 30 sec Standing march with 5-lb ankle weights; 2 x 10 alternating, holding treadmill armrest Sit to stand: 1x6 and 1x4; from chair with 8-lb Goblet hold  Unipedal stance; 2 x 15 sec Bridge 3x12:  Black Tband at distal femurs with good carryover  in form/technique.  Butterfly stretch; 3 x 20 sec        PATIENT EDUCATION:  Education details: see above for patient education details Person educated: Patient Education method: Explanation and Demonstration Education comprehension: verbalized understanding   HOME EXERCISE PROGRAM: Access Code: [email or web address] URL: [email or web address] Date: 07/19/2023 Prepared by: Venetia Endo  Exercises - Sit to Stand  - 1 x daily - 7 x weekly - 2 sets - 10 reps - Supine Bridge with Knee Extension  - 1 x daily - 7 x weekly - 2 sets - 10 reps - Sidelying Hip Abduction  - 1 x daily - 7 x weekly - 2 sets - 10 reps - Prone Hip Extension  - 1 x daily - 7 x weekly - 2 sets - 10 reps - Standing 3-Way Kick  - 1 x daily - 7 x weekly - 2 sets - 5-8 reps - Forward Step Over with Counter Support  - 1 x daily - 7 x weekly -  2 sets - 10 reps - Standing Romberg to 1/2 Tandem Stance  - 1 x daily - 7 x weekly - 3 sets - 30sec hold     ASSESSMENT:   CLINICAL IMPRESSION:  Patient is awaiting f/u with Dr. Krasinski following recent nuclear medicine 3-phase bone scan (read by radiologist on 08/07/23; report available on Epic at this time). Pt has improved in regard to gait quality, weaning from use of AD, completion of safe and independent short-distance community mobility, and hip strength. Pt has ongoing limitations with prolonged duration of weightbearing and activity limitation secondary to groin pain. We are working on continued stabilization, progressive weightbearing work, and gluteal strengthening as tolerated. Previous trials of STM/DTM/IASTM, TPR, and passive stretching have not provided significant relief. We may consider DN pending approval from referring surgeon to perform this under work comp. Pt does need further work on restoration of strength and endurance for higher volume of weightbearing activity needed for moving about his office/work facility during workday. Further PT POC is pending  diagnostic testing and follow-up with physician.       OBJECTIVE IMPAIRMENTS: Abnormal gait, decreased activity tolerance, decreased balance, decreased endurance, decreased knowledge of condition, decreased knowledge of use of DME, decreased mobility, difficulty walking, decreased ROM, decreased strength, and dizziness.    ACTIVITY LIMITATIONS: carrying, lifting, bending, sitting, standing, squatting, sleeping, stairs, transfers, dressing, and locomotion level   PARTICIPATION LIMITATIONS: cleaning, shopping, community activity, occupation, and yard work   PERSONAL FACTORS:  Pt is impulsive and eager to get better so that he can return to work.   are also affecting patient's functional outcome.    REHAB POTENTIAL: Good   CLINICAL DECISION MAKING: Stable/uncomplicated   EVALUATION COMPLEXITY: Low     GOALS: Goals reviewed with patient? Yes   SHORT TERM GOALS: Target date: 12/22/2022 Pt will improve L hip ROM to 10 to 90 degrees without pain in order to promote normal gait and stability.  Baseline: 20 degree Hip felx and 0 deg Ext 12/04/22: 90 deg hip flex and 5 deg EXT 12/18/22: 90 deg hip flex and 10 deg hip EXT Goal status: ACHIEVED    2.  Pt will be able to ambulate 572ft safely with Cane without fall and pain . Baseline: 30 ft with SBA and heavy reliance on [facility].   12/04/22: Pt uses SPC for primary means of mobility in home and community at this time.  Goal status: ACHIEVED       LONG TERM GOALS: Target date: 01/26/2023   Pt will Improve TUG time to <10secs without SPC to demonstrate decreased fall risk.  Baseline: 19 secs with SPC 12/04/22: 14.6 seconds with SPC 12/18/22: 9.5 sec Goal status: ACHIEVED   2.  Pt will Improve LEFS score to >65/80 to demonstrate improved QoL Baseline: 18/80 12/04/22: 20/80  12/18/22: 22/80 01/29/23: 25/80  03/15/23: 24/80 04/09/23: 27/80  04/30/23: 26/80 05/16/23:  19/80 07/10/23: 23/80 Goal status: NOT MET   3.  Pt will be able to ambulate  Independently  with LRAD without LOB and pain in order to return to community level mobility safely  Baseline: With [facility] and [facility].   12/04/22: ModI ambulation with SPC/LRAD with mild pain at longer distances, no LOB 12/18/22: Amb IND with SPC with no LOB Goal status: ACHIEVED    4.  Pt will score >50 on FOTO Baseline: 36/56 12/04/22: 35/56 12/18/22: 37/56 01/29/23: 43/56 03/15/23: 38/56 04/09/23: 43/56 04/30/23: 41/56 05/16/23:  38/56 07/10/23: 40/56 07/19/23: 40/56 Goal status: NOT MET  5.  Pt will demonstrate 4/5 L hip strength without pain to improve stability and safety with all functional mobility in order to return to work.  Baseline: 2/5 grossly    12/04/22: 4-/5 grossly 12/18/22: 4-/5 grossly 01/29/23: 4/5 for hip flexors/quads/HS, 3+/5 for hip ABD and EXT.  03/15/23: Remaining weakness in hip flexors, quads, abductors, extensors.  04/09/23: Improved strength with pt up to at least 4/5, pain with loading hip flexors and abductors  04/30/23: Weak hip abduction and extension MMT with heightened pain; good strength otherwise 05/16/23: Weak hip flexion, abduction, extension; weak quads; pain with hip flexor and gluteal MMTs 07/10/23: Weakness primarily remaining in hip abduction only, pain with loading gluteal mm 07/19/23: Met for all with exception of hip abduction  Goal status: IN PROGRESS/MOSTLY MET         PLAN:   PT FREQUENCY: 2x/week   PT DURATION: 4 weeks   PLANNED INTERVENTIONS: Therapeutic exercises, Therapeutic activity, Neuromuscular re-education, Balance training, Gait training, Patient/Family education, Self Care, and Joint mobilization   PLAN FOR NEXT SESSION: Continue with graded exposure to weightbearing activity, strategies for pain modulation prn. F/u pending further diagnostic workup per physician; pt still awaiting radiologist's report/physician f/u regarding recent scans.     Venetia Endo, PT, DPT [phone] Physical Therapist- [facility]  08/07/2023, 5:05 PM

## 2023-08-09 ENCOUNTER — Ambulatory Visit: Payer: Worker's Compensation | Attending: Orthopedic Surgery | Admitting: Physical Therapy

## 2023-08-09 ENCOUNTER — Encounter: Payer: Self-pay | Admitting: Physical Therapy

## 2023-08-09 VITALS — BP 149/85 | HR 76

## 2023-08-09 DIAGNOSIS — R29898 Other symptoms and signs involving the musculoskeletal system: Secondary | ICD-10-CM | POA: Diagnosis present

## 2023-08-09 DIAGNOSIS — M25552 Pain in left hip: Secondary | ICD-10-CM | POA: Insufficient documentation

## 2023-08-09 DIAGNOSIS — Z96642 Presence of left artificial hip joint: Secondary | ICD-10-CM | POA: Diagnosis present

## 2023-08-09 DIAGNOSIS — R2689 Other abnormalities of gait and mobility: Secondary | ICD-10-CM | POA: Insufficient documentation

## 2023-08-09 DIAGNOSIS — R262 Difficulty in walking, not elsewhere classified: Secondary | ICD-10-CM | POA: Diagnosis present

## 2023-08-09 NOTE — Therapy (Addendum)
 OUTPATIENT PHYSICAL THERAPY TREATMENT    Patient Name: Christian Hughes MRN: [id number] DOB:[birth year], 63 y.o., male Today's Date: 08/09/2023   END OF SESSION:   PT End of Session - 08/09/23 1600     Visit Number 56    Number of Visits 58    Authorization Type Workers Compensation    Progress Note Due on Visit 50    PT Start Time 1600    PT Stop Time 1643    PT Time Calculation (min) 43 min    Equipment Utilized During Treatment Gait belt    Activity Tolerance Patient tolerated treatment well;Patient limited by pain    Behavior During Therapy WFL for tasks assessed/performed;Impulsive              Past Medical History:  Diagnosis Date   Engages in vaping    H/O ventral hernia    S/p of repair   Hypertension Early 2010's   Marijuana use    Orthostatic dizziness 10/08/2022   Past Surgical History:  Procedure Laterality Date   HIP ARTHROPLASTY Left 10/07/2022   Procedure: ARTHROPLASTY BIPOLAR HIP (HEMIARTHROPLASTY);  Surgeon: Marchia Drivers, MD;  Location: [facility] ORS;  Service: Orthopedics;  Laterality: Left;   JOINT REPLACEMENT  10/06/2022   Left Hip Replacement due fall while at work   Ventral hernia repair     Patient Active Problem List   Diagnosis Date Noted   Asymptomatic hypertensive urgency 04/05/2023   Osteopenia determined by x-ray 02/12/2023   Hyperlipidemia 01/10/2023   Prediabetes 01/10/2023   Primary hypertension 01/10/2023   Left ventricular hypertrophy 01/10/2023   Abnormal EKG 10/06/2022   Bone lesion_sclerotic lesions in the right iliac bone 10/06/2022    PCP: Honor Cork MD  REFERRING PROVIDER: Drivers Marchia MD   REFERRING DIAG: [phone] (ICD-10-CM) - Presence of left artificial hip joint   THERAPY DIAG:  History of left hip hemiarthroplasty  Pain in left hip  Weakness of left leg  Balance problems  Difficulty in walking, not elsewhere classified  Rationale for Evaluation and Treatment Rehabilitation  PERTINENT HISTORY: From  initial eval: Christian Hughes is a 63 y.o. male without significant past medical history except for occasional vaping amd marijuana use, who presents with fall and left hip pain.  Pt states that he slipped and fell at work at about 11:00 AM.  Denies loss of consciousness.  He injured his left hip, causing left hip pain, which is constant, sharp, severe, nonradiating, aggravated by movement.  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    My mom took care of me after my hip surgery. I had therapy at home. I returned to my home alone after my first follow up appointment. I have pain in my buttocks, groin and the corner of my buttocks of about 2 to 3/10. I have high pain tolerance. I am unable to walk normal and I feel weak. I began to use this cane on my own after my PT at home stopped.  I can't wait to go to work because, I love to work.   PAIN:  Are you having pain? Yes: Pain location: 2-3/10 Pain description: stabbing Aggravating factors: Moving, walking, WB   Relieving factors: rest, OTC meds, ice    WEIGHT BEARING RESTRICTIONS: Yes:  WBAT   FALLS:  Has patient fallen in last 6 months? Yes. 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 Pt negotiates some small staircases in town. Pt has historically taken 24-packs of water bottles to his work for customers.    PLOF: Independent   PATIENT GOALS: I want to return to work without hurting and falling as soon as I can.   PRECAUTIONS: Posterior hip precautions     OBJECTIVE:  (objective measures completed at initial evaluation unless otherwise dated)  PALPATION: Pain in L sacral border, Iliac crest, gluteus medius, TFL, and greater trochanter     LOWER EXTREMITY ROM:    Active ROM Right eval Left eval  Hip flexion WFL 20 deg with pain  Hip extension [facility]    Hip abduction WFL 20 with pain   Hip adduction [facility]    Hip internal rotation [facility]    Hip external rotation [facility]    Knee flexion [facility]  Knee extension [facility]  Ankle dorsiflexion [facility]  Ankle plantarflexion [facility]  Ankle inversion [facility]  Ankle eversion WFL WFL   (Blank rows = not tested)   LOWER EXTREMITY MMT:   MMT Right eval Left eval Left 04/11/23 Left 04/30/23 Left 05/16/23 Left 07/10/23 Left 07/19/23  Hip flexion WFL 2+ 4* 4+ 4* 4+ 4+*  Hip extension WFL 2 4 3+* 4-* 4* 4*  Hip abduction WFl 2 4* 3+* 3+* 4-* 4-*  Hip adduction WFl         Hip internal rotation [facility]         Hip external rotation [facility]         Knee flexion WFL 3- 5 5 4+ 5 5  Knee extension WFl 3 4+ 5 4* 5- 4*  Ankle dorsiflexion WFL 3+       Ankle plantarflexion WFL 4       Ankle inversion WFL 4       Ankle eversion WFL 3-        (Blank rows = not tested) Pt's L ankle remains inverted 10 degrees since the Surgery.     FUNCTIONAL TESTS (INITIAL EVALUATION) 30 seconds chair stand test: 5 reps completed Timed up and go (TUG): 19secs 10 meter walk test: 12secs= 1,2 m/secs             Balance Test : SL standing unable, Modified tandem 10 secs, Tandem Unable, Rhomberg with EO 5 secs and unable with EC.  GAIT: Distance walked: 30 ft  Assistive device utilized: Single point cane Level of assistance: SBA Comments: Pt unsteady on feet with severe antalgic gait, decreased loading response, absent heel strike and absent toe off gait with uneven stride and decreased speed. Heavy reliance on the [facility]   -----  03/27/23  Posture: Lumbar lordosis: WNL Iliac crest height: Equal bilaterally Lumbar lateral shift: Mild shift  to R to offload LLE  AROM AROM (Normal range in degrees) AROM   Lumbar   Flexion (65) 75% (stopped at 90 deg hip flexion)  Extension (30) 50%  Right lateral flexion (25) 100%  Left lateral flexion (25) 100%  Right rotation (30) 75%  Left rotation (30) 75%      (* = pain; Blank rows = not tested)  Sensation Grossly intact to light touch throughout bilateral LEs as determined by testing dermatomes L2-S2. Proprioception, stereognosis, and hot/cold testing deferred on this date.   Special Tests Lumbar Radiculopathy and Discogenic: Centralization and Peripheralization (SN 92, -LR 0.12): Not examined Slump (SN 83, -LR 0.32): R: Negative L: Negative SLR (SN 92, -LR 0.29): R: Negative L:  Negative  Facet Joint: Extension-Rotation (SN 100, -LR 0.0): R: Negative L: Positive  Lumbar Foraminal Stenosis: Lumbar quadrant (SN 70): R: Negative L: Positive      TODAY'S TREATMENT:                                                                                                                              DATE: 08/09/2023   L hip hemiarthroplasty following L femoral neck fracture (DOS: 10/07/22)  Next f/u with surgeon: 08/20/23   SUBJECTIVE STATEMENT:  Pt reports similar levels of baseline pain along 2-3/10. Patient reports BP usually in 130s for SBP and 80s for DBP first thing in AM. Patient reports compliance with HEP. Patient reports tolerating last visit relatively well; some soreness that was reduced following shower and rest period.    Today's Vitals   08/09/23 1603  BP: (!) 149/85  Pulse: 76  SpO2: 99%        There is no height or weight on file to calculate BMI.    Therapeutic Exercise - for improved soft tissue flexibility and extensibility as needed for ROM, improved strength as needed to improve performance of CKC activities/functional movements   Nu-Step L4 for 2.5 minutes; L3 for 2.5 minutes. Seat at 12 to maintain posterior hip flexion precautions  Single limb  bridge; 2x8 alternating R/L  Lateral step-up, 6-inch step, staircase in center of gym; 2x10  Single leg stance; multiple attempts with up to 13 sec attained on surgical LE  3-way hip; Red Tband around ankles; x 8 ea dir, bilat LE, in // bars;  Alternating lateral step up to Airex pad; 2x10 alternating R/L    PATIENT EDUCATION: discussed current options moving forward for symptom modulation and consideration of dry needling pending approval from referring physician     *not today* Semitandem stance on Airex; 1 x 30 sec each position Open gate, 1x D/B along blue agility ladder  -notable difficulty with successive repetitions with discomfort around L groin Hip flexor stretch, opposite foot on second step; x20, 3 sec hold, dynamic stretch; Hurdle step over 6-inch hurdle with focus on heel strike and heel to toe progression; 2 x10 ea LE Consecutive forward hurdle step-over, heel to toe; 2x10 ea LE, over 6-inch hurdle on floor Alternating single-limb bridge; reviewed for HEP Dynamic march, blue agility ladder; x4 D/B, 4-lb ankle weights   3-way hip, standing; 1x5 ea dir, bilat Sit to stand, Airex on seat for inc seat height and Airex under feet; 2x8 Sidelying hip abduction, with 2-lb ankle weight; 2x8 Butterfly stretch; 2 x 30 sec hold  Cold pack (unbilled) -  for anti-inflammatory and analgesic effect during e-stim as needed for reduced pain and improved ability to participate in active PT intervention, along L anterior in supine, x 5 minutes Forward monster walk (forward step + abduction) with Red Tband; 5x D/B length of // bars Heel walk , in // bars; x2 D/B; limited ability to maintain active dorsiflexion with stepping in bars Standing heel raise/toe raise; 2x15 alternating  Single limb stance; multiple attempts with up to 10 sec attained on each LE, in // bars Sidestep with Blue Tband superior to patellae; 4x D/B length of // bars    *not today* Lateral step up to 6-inch step;  staircase in center of gym; 1x12  -pt has limited tolerance of CKC loading with vaulting off of LE on floor and lateral flexion to L when weight shifting to LLE Glute bridge with Green physioball under calves; 3x10 Manual prone quad stretch; 2 x 30 sec Prone quad stretch with bedsheet looped around ankle; x 30 sec  -for HEP demo  -pt has hamstring cramp when attempted gactive prone knee flexion hampering initiating of prone quad stretch Butt kicker along blue agility ladder; 2x D/B length of ladder for active quad/hip flexor mobility work and weight shifting Side steps along blue agility ladder: x2 D/B with Blue Tband looped above patellae Alternating arms and legs with abdominal brace with Green physioball; x20 alternating R/L Forward step-up to opposite LE tap on next step; 2x10, 6-inch step (staircase center of clinic) Alternating hip flexion with Tband, in standing today; 2x8 alternating R/L with Red Tband Forward step over (3) 6-inch hurdles and (2) 12-inch hurdles, unsupported in open gym environment; 3x D/B with reciprocal patternProne hip extension, alternating; 2x10 alt; 2-lb ankle weights Butterfly stretch, technique reviewed for HEP Modified Thomas hip flexor stretch, technique reviewed for HEP Forward step up to Airex pad; 1x15 surgical LE leading  Prone knee bend, dynamic stretch; 1x10, 1 sec; for review Toe tapping; 2x10 alternating R/L with 12-inch step, no UE support; stance on Airex padThomas hip flexor stretch; reviewed Butterfly stretch in supine; reviewed Manual hip flexor stretch in R sidelying, gentle (hip to neutral); 2 x 30 sec Standing march with 5-lb ankle weights; 2 x 10 alternating, holding treadmill armrest Sit to stand: 1x6 and 1x4; from chair with 8-lb Goblet hold  Unipedal stance; 2 x 15 sec Bridge 3x12:  Black Tband at distal femurs with good carryover in form/technique.  Butterfly stretch; 3 x 20 sec        PATIENT EDUCATION:  Education details: see  above for patient education details Person educated: Patient Education method: Explanation and Demonstration Education comprehension: verbalized understanding   HOME EXERCISE PROGRAM: Access Code: [email or web address] URL: [email or web address] Date: 07/19/2023 Prepared by: Venetia Endo  Exercises - Sit to Stand  - 1 x daily - 7 x weekly - 2 sets - 10 reps - Supine Bridge with Knee Extension  - 1 x daily - 7 x weekly - 2 sets - 10 reps - Sidelying Hip Abduction  - 1 x daily - 7 x weekly - 2 sets - 10 reps - Prone Hip Extension  - 1 x daily - 7 x weekly - 2 sets - 10 reps - Standing 3-Way Kick  - 1 x daily - 7 x weekly - 2 sets - 5-8 reps - Forward Step Over with Counter Support  - 1 x daily - 7 x weekly - 2 sets - 10 reps - Standing Romberg to 1/2 Tandem Stance  -  1 x daily - 7 x weekly - 3 sets - 30sec hold     ASSESSMENT:   CLINICAL IMPRESSION:  Patient is exhibiting improved gait pattern and improved compensated Trendelenburg on surgical side; minimal foot slap over previous week. He has ongoing groin discomfort that is low-level at baseline, but he is limited in tolerance of longer-distance walking/weightbearing activity and closed-chain drills. Pt is awaiting further follow-up with surgeon on 08/20/23 to discuss recent nuclear medicine bone scan. We continued with progression of gluteal strengthening and progressive weightbearing activity/stabilization  as tolerated today. Pt will continue to benefit from skilled PT services to address deficits and improve function.       OBJECTIVE IMPAIRMENTS: Abnormal gait, decreased activity tolerance, decreased balance, decreased endurance, decreased knowledge of condition, decreased knowledge of use of DME, decreased mobility, difficulty walking, decreased ROM, decreased strength, and dizziness.    ACTIVITY LIMITATIONS: carrying, lifting, bending, sitting, standing, squatting, sleeping, stairs, transfers, dressing, and locomotion level    PARTICIPATION LIMITATIONS: cleaning, shopping, community activity, occupation, and yard work   PERSONAL FACTORS:  Pt is impulsive and eager to get better so that he can return to work.   are also affecting patient's functional outcome.    REHAB POTENTIAL: Good   CLINICAL DECISION MAKING: Stable/uncomplicated   EVALUATION COMPLEXITY: Low     GOALS: Goals reviewed with patient? Yes   SHORT TERM GOALS: Target date: 12/22/2022 Pt will improve L hip ROM to 10 to 90 degrees without pain in order to promote normal gait and stability.  Baseline: 20 degree Hip felx and 0 deg Ext 12/04/22: 90 deg hip flex and 5 deg EXT 12/18/22: 90 deg hip flex and 10 deg hip EXT Goal status: ACHIEVED    2.  Pt will be able to ambulate 568ft safely with Cane without fall and pain . Baseline: 30 ft with SBA and heavy reliance on [facility].   12/04/22: Pt uses SPC for primary means of mobility in home and community at this time.  Goal status: ACHIEVED       LONG TERM GOALS: Target date: 01/26/2023   Pt will Improve TUG time to <10secs without SPC to demonstrate decreased fall risk.  Baseline: 19 secs with SPC 12/04/22: 14.6 seconds with SPC 12/18/22: 9.5 sec Goal status: ACHIEVED   2.  Pt will Improve LEFS score to >65/80 to demonstrate improved QoL Baseline: 18/80 12/04/22: 20/80  12/18/22: 22/80 01/29/23: 25/80  03/15/23: 24/80 04/09/23: 27/80  04/30/23: 26/80 05/16/23:  19/80 07/10/23: 23/80 Goal status: NOT MET   3.  Pt will be able to ambulate Independently  with LRAD without LOB and pain in order to return to community level mobility safely  Baseline: With [facility] and [facility].   12/04/22: ModI ambulation with SPC/LRAD with mild pain at longer distances, no LOB 12/18/22: Amb IND with SPC with no LOB Goal status: ACHIEVED    4.  Pt will score >50 on FOTO Baseline: 36/56 12/04/22: 35/56 12/18/22: 37/56 01/29/23: 43/56 03/15/23: 38/56 04/09/23: 43/56 04/30/23: 41/56 05/16/23:  38/56 07/10/23: 40/56 07/19/23: 40/56 Goal  status: NOT MET   5.  Pt will demonstrate 4/5 L hip strength without pain to improve stability and safety with all functional mobility in order to return to work.  Baseline: 2/5 grossly    12/04/22: 4-/5 grossly 12/18/22: 4-/5 grossly 01/29/23: 4/5 for hip flexors/quads/HS, 3+/5 for hip ABD and EXT.  03/15/23: Remaining weakness in hip flexors, quads, abductors, extensors.  04/09/23: Improved strength with pt up to at  least 4/5, pain with loading hip flexors and abductors  04/30/23: Weak hip abduction and extension MMT with heightened pain; good strength otherwise 05/16/23: Weak hip flexion, abduction, extension; weak quads; pain with hip flexor and gluteal MMTs 07/10/23: Weakness primarily remaining in hip abduction only, pain with loading gluteal mm 07/19/23: Met for all with exception of hip abduction  Goal status: IN PROGRESS/MOSTLY MET         PLAN:   PT FREQUENCY: 2x/week   PT DURATION: 4 weeks   PLANNED INTERVENTIONS: Therapeutic exercises, Therapeutic activity, Neuromuscular re-education, Balance training, Gait training, Patient/Family education, Self Care, and Joint mobilization   PLAN FOR NEXT SESSION: Continue with graded exposure to weightbearing activity, strategies for pain modulation prn. F/u pending further diagnostic workup per physician; pt still awaiting radiologist's report/physician f/u regarding recent scans.     Venetia Endo, PT, DPT [phone] Physical Therapist- [facility]  08/09/2023, 4:05 PM

## 2023-08-14 ENCOUNTER — Encounter: Payer: Self-pay | Admitting: Physical Therapy

## 2023-08-14 ENCOUNTER — Ambulatory Visit: Payer: Worker's Compensation | Admitting: Physical Therapy

## 2023-08-14 VITALS — BP 151/81 | HR 70

## 2023-08-14 DIAGNOSIS — R2689 Other abnormalities of gait and mobility: Secondary | ICD-10-CM

## 2023-08-14 DIAGNOSIS — R29898 Other symptoms and signs involving the musculoskeletal system: Secondary | ICD-10-CM

## 2023-08-14 DIAGNOSIS — Z96642 Presence of left artificial hip joint: Secondary | ICD-10-CM | POA: Diagnosis not present

## 2023-08-14 DIAGNOSIS — R262 Difficulty in walking, not elsewhere classified: Secondary | ICD-10-CM

## 2023-08-14 DIAGNOSIS — M25552 Pain in left hip: Secondary | ICD-10-CM

## 2023-08-14 NOTE — Therapy (Signed)
 OUTPATIENT PHYSICAL THERAPY TREATMENT    Patient Name: Christian Hughes MRN: [id number] DOB:Dec 14, 1960, 63 y.o., male Today's Date: 08/14/2023   END OF SESSION:   PT End of Session - 08/14/23 1428     Visit Number 57    Number of Visits 58    Authorization Type Workers Compensation    Progress Note Due on Visit 50    PT Start Time 1428    PT Stop Time 1510    PT Time Calculation (min) 42 min    Equipment Utilized During Treatment Gait belt    Activity Tolerance Patient tolerated treatment well;Patient limited by pain    Behavior During Therapy WFL for tasks assessed/performed;Impulsive               Past Medical History:  Diagnosis Date   Engages in vaping    H/O ventral hernia    S/p of repair   Hypertension Early 2010's   Marijuana use    Orthostatic dizziness 10/08/2022   Past Surgical History:  Procedure Laterality Date   HIP ARTHROPLASTY Left 10/07/2022   Procedure: ARTHROPLASTY BIPOLAR HIP (HEMIARTHROPLASTY);  Surgeon: Marchia Drivers, MD;  Location: [facility] ORS;  Service: Orthopedics;  Laterality: Left;   JOINT REPLACEMENT  10/06/2022   Left Hip Replacement due fall while at work   Ventral hernia repair     Patient Active Problem List   Diagnosis Date Noted   Asymptomatic hypertensive urgency 04/05/2023   Osteopenia determined by x-ray 02/12/2023   Hyperlipidemia 01/10/2023   Prediabetes 01/10/2023   Primary hypertension 01/10/2023   Left ventricular hypertrophy 01/10/2023   Abnormal EKG 10/06/2022   Bone lesion_sclerotic lesions in the right iliac bone 10/06/2022    PCP: Honor Cork MD  REFERRING PROVIDER: Drivers Marchia MD   REFERRING DIAG: [phone] (ICD-10-CM) - Presence of left artificial hip joint   THERAPY DIAG:  History of left hip hemiarthroplasty  Pain in left hip  Weakness of left leg  Balance problems  Difficulty in walking, not elsewhere classified  Rationale for Evaluation and Treatment Rehabilitation  PERTINENT HISTORY:  From initial eval: Christian Hughes is a 63 y.o. male without significant past medical history except for occasional vaping amd marijuana use, who presents with fall and left hip pain.  Pt states that he slipped and fell at work at about 11:00 AM.  Denies loss of consciousness.  He injured his left hip, causing left hip pain, which is constant, sharp, severe, nonradiating, aggravated by movement.  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    My mom took care of me after my hip surgery. I had therapy at home. I returned to my home alone after my first follow up appointment. I have pain in my buttocks, groin and the corner of my buttocks of about 2 to 3/10. I have high pain tolerance. I am unable to walk normal and I feel weak. I began to use this cane on my own after my PT at home stopped.  I can't wait to go to work because, I love to work.   PAIN:  Are you having pain? Yes: Pain location: 2-3/10 Pain description: stabbing Aggravating factors: Moving, walking, WB   Relieving factors: rest, OTC meds, ice    WEIGHT BEARING RESTRICTIONS: Yes:  WBAT   FALLS:  Has patient fallen in last 6 months? Yes. 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 Pt negotiates some small staircases in town. Pt has historically taken 24-packs of water bottles to his work for customers.    PLOF: Independent   PATIENT GOALS: I want to return to work without hurting and falling as soon as I can.   PRECAUTIONS: Posterior hip  precautions     OBJECTIVE: (objective measures completed at initial evaluation unless otherwise dated)  PALPATION: Pain in L sacral border, Iliac crest, gluteus medius, TFL, and greater trochanter     LOWER EXTREMITY ROM:    Active ROM Right eval Left eval  Hip flexion WFL 20 deg with pain  Hip extension [facility]    Hip abduction WFL 20 with pain   Hip adduction [facility]    Hip internal rotation [facility]    Hip external rotation [facility]    Knee flexion [facility]  Knee extension [facility]  Ankle dorsiflexion [facility]  Ankle plantarflexion [facility]  Ankle inversion [facility]  Ankle eversion WFL WFL   (Blank rows = not tested)   LOWER EXTREMITY MMT:   MMT Right eval Left eval Left 04/11/23 Left 04/30/23 Left 05/16/23 Left 07/10/23 Left 07/19/23  Hip flexion WFL 2+ 4* 4+ 4* 4+ 4+*  Hip extension WFL 2 4 3+* 4-* 4* 4*  Hip abduction WFl 2 4* 3+* 3+* 4-* 4-*  Hip adduction WFl         Hip internal rotation [facility]         Hip external rotation [facility]         Knee flexion WFL 3- 5 5 4+ 5 5  Knee extension WFl 3 4+ 5 4* 5- 4*  Ankle dorsiflexion WFL 3+       Ankle plantarflexion WFL 4       Ankle inversion WFL 4       Ankle eversion WFL 3-        (Blank rows = not tested) Pt's L ankle remains inverted 10 degrees since the Surgery.     FUNCTIONAL TESTS (INITIAL EVALUATION) 30 seconds chair stand test: 5 reps completed Timed up and go (TUG): 19secs 10 meter walk test: 12secs= 1,2 m/secs             Balance Test : SL standing unable, Modified tandem 10 secs, Tandem Unable, Rhomberg with EO 5 secs and unable with EC.  GAIT: Distance walked: 30 ft  Assistive device utilized: Single point cane Level of assistance: SBA Comments: Pt unsteady on feet with severe antalgic gait, decreased loading response, absent heel strike and absent toe off gait with uneven stride and decreased speed. Heavy reliance on the [facility]   -----  03/27/23  Posture: Lumbar lordosis: WNL Iliac crest height: Equal  bilaterally Lumbar lateral shift: Mild shift to R to offload LLE  AROM AROM (Normal range in degrees) AROM   Lumbar   Flexion (65) 75% (stopped at 90 deg hip flexion)  Extension (30) 50%  Right lateral flexion (25) 100%  Left lateral flexion (25) 100%  Right rotation (30) 75%  Left rotation (30) 75%      (* = pain; Blank rows =  not tested)   Sensation Grossly intact to light touch throughout bilateral LEs as determined by testing dermatomes L2-S2. Proprioception, stereognosis, and hot/cold testing deferred on this date.   Special Tests Lumbar Radiculopathy and Discogenic: Centralization and Peripheralization (SN 92, -LR 0.12): Not examined Slump (SN 83, -LR 0.32): R: Negative L: Negative SLR (SN 92, -LR 0.29): R: Negative L:  Negative  Facet Joint: Extension-Rotation (SN 100, -LR 0.0): R: Negative L: Positive  Lumbar Foraminal Stenosis: Lumbar quadrant (SN 70): R: Negative L: Positive      TODAY'S TREATMENT:                                                                                                                              DATE: 08/14/2023   L hip hemiarthroplasty following L femoral neck fracture (DOS: 10/07/22)  Next f/u with surgeon: 08/20/23   SUBJECTIVE STATEMENT:  Pt reports usual pain around 2-3/10 - pain depends on volume of walking. He feels that cold weather can also aggravate symptoms. Pt feels that hip symptoms do not feel like they necessarily hurt, but they can affect his gait and lead to worsening foot slap or decreased heel strike at initial contact.    Today's Vitals   08/14/23 1428 08/14/23 1432  BP: (!) 156/88 (!) 151/81  Pulse: 70      There is no height or weight on file to calculate BMI.    Therapeutic Exercise - for improved soft tissue flexibility and extensibility as needed for ROM, improved strength as needed to improve performance of CKC activities/functional movements   Nu-Step L4 for 5 minutes. Seat at 12 to maintain  posterior hip flexion precautions  Supine hip abduction with Blue Tband, for hip abduction mobility in non-weightbearing position; 2x10  -raised Tband closer to hip for second set to reduce torque and improve tolerance  Single limb bridge; 2x8 alternating R/L  Toe tap with mirror feedback to avoid lateral trunk lean with L weight shift; 2x10 alternating R/L with 6-inch step  Dynamic march on blue agility ladder with focus on weight shift with upright posture; x 1.5 D/B length of ladder  Lateral step-up, 6-inch step, staircase in center of gym; 2x10  Forward step up to Airex pad; 2x10 with surgical LE leading    PATIENT EDUCATION: discussed BP management and following up with PCP regarding high readings at baseline at arrival to PT. We also discussed gauging true resting BP with measurements taken first thing in AM.      *not today* Single leg stance; multiple attempts with up to 13 sec attained on surgical LE 3-way hip; Red Tband around ankles; x 8 ea dir, bilat LE, in // bars; Semitandem stance on Airex; 1 x 30 sec each position Open gate, 1x D/B along blue agility ladder  -notable difficulty with successive repetitions with discomfort around L groin Hip flexor stretch, opposite foot on second step; x20, 3 sec hold, dynamic stretch; Hurdle step over 6-inch  hurdle with focus on heel strike and heel to toe progression; 2 x10 ea LE Consecutive forward hurdle step-over, heel to toe; 2x10 ea LE, over 6-inch hurdle on floor Alternating single-limb bridge; reviewed for HEP 3-way hip, standing; 1x5 ea dir, bilat Sit to stand, Airex on seat for inc seat height and Airex under feet; 2x8 Sidelying hip abduction, with 2-lb ankle weight; 2x8 Butterfly stretch; 2 x 30 sec hold  Cold pack (unbilled) - for anti-inflammatory and analgesic effect during e-stim as needed for reduced pain and improved ability to participate in active PT intervention, along L anterior in supine, x 5 minutes Forward  monster walk (forward step + abduction) with Red Tband; 5x D/B length of // bars Heel walk , in // bars; x2 D/B; limited ability to maintain active dorsiflexion with stepping in bars Standing heel raise/toe raise; 2x15 alternating  Single limb stance; multiple attempts with up to 10 sec attained on each LE, in // bars Sidestep with Blue Tband superior to patellae; 4x D/B length of // bars    *not today* Lateral step up to 6-inch step; staircase in center of gym; 1x12  -pt has limited tolerance of CKC loading with vaulting off of LE on floor and lateral flexion to L when weight shifting to LLE Glute bridge with Green physioball under calves; 3x10 Manual prone quad stretch; 2 x 30 sec Prone quad stretch with bedsheet looped around ankle; x 30 sec  -for HEP demo  -pt has hamstring cramp when attempted gactive prone knee flexion hampering initiating of prone quad stretch Butt kicker along blue agility ladder; 2x D/B length of ladder for active quad/hip flexor mobility work and weight shifting Side steps along blue agility ladder: x2 D/B with Blue Tband looped above patellae Alternating arms and legs with abdominal brace with Green physioball; x20 alternating R/L Forward step-up to opposite LE tap on next step; 2x10, 6-inch step (staircase center of clinic) Alternating hip flexion with Tband, in standing today; 2x8 alternating R/L with Red Tband Forward step over (3) 6-inch hurdles and (2) 12-inch hurdles, unsupported in open gym environment; 3x D/B with reciprocal patternProne hip extension, alternating; 2x10 alt; 2-lb ankle weights Butterfly stretch, technique reviewed for HEP Modified Thomas hip flexor stretch, technique reviewed for HEP Forward step up to Airex pad; 1x15 surgical LE leading  Prone knee bend, dynamic stretch; 1x10, 1 sec; for review Toe tapping; 2x10 alternating R/L with 12-inch step, no UE support; stance on Airex padThomas hip flexor stretch; reviewed Butterfly stretch in  supine; reviewed Manual hip flexor stretch in R sidelying, gentle (hip to neutral); 2 x 30 sec Standing march with 5-lb ankle weights; 2 x 10 alternating, holding treadmill armrest Sit to stand: 1x6 and 1x4; from chair with 8-lb Goblet hold  Unipedal stance; 2 x 15 sec Bridge 3x12:  Black Tband at distal femurs with good carryover in form/technique.  Butterfly stretch; 3 x 20 sec        PATIENT EDUCATION:  Education details: see above for patient education details Person educated: Patient Education method: Explanation and Demonstration Education comprehension: verbalized understanding   HOME EXERCISE PROGRAM: Access Code: [email or web address] URL: [email or web address] Date: 07/19/2023 Prepared by: Venetia Endo  Exercises - Sit to Stand  - 1 x daily - 7 x weekly - 2 sets - 10 reps - Supine Bridge with Knee Extension  - 1 x daily - 7 x weekly - 2 sets - 10 reps - Sidelying Hip Abduction  - 1 x daily -  7 x weekly - 2 sets - 10 reps - Prone Hip Extension  - 1 x daily - 7 x weekly - 2 sets - 10 reps - Standing 3-Way Kick  - 1 x daily - 7 x weekly - 2 sets - 5-8 reps - Forward Step Over with Counter Support  - 1 x daily - 7 x weekly - 2 sets - 10 reps - Standing Romberg to 1/2 Tandem Stance  - 1 x daily - 7 x weekly - 3 sets - 30sec hold     ASSESSMENT:   CLINICAL IMPRESSION:  Patient fortunately has not recently exhibited BP in urgency range, but he does have BP between stages 1 and 2 hypertension for SBP. We discussed following up with PCP to ensure adequate management for long-term health. 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 We worked on hip adductor soft tissue mobility with use of supine resisted hip abduction and worked on quality of weight shift and stepping without compensatory pattern. Pt has ongoing issues with groin pain and ambulatory  activity limitation persisting further than is expected for this time post-operatively. Pt is awaiting further follow-up with surgeon on 08/20/23 to discuss recent nuclear medicine bone scan. We continued with progression of gluteal strengthening and progressive weightbearing activity/stabilization  as tolerated today. Pt will continue to benefit from skilled PT services to address deficits and improve function.       OBJECTIVE IMPAIRMENTS: Abnormal gait, decreased activity tolerance, decreased balance, decreased endurance, decreased knowledge of condition, decreased knowledge of use of DME, decreased mobility, difficulty walking, decreased ROM, decreased strength, and dizziness.    ACTIVITY LIMITATIONS: carrying, lifting, bending, sitting, standing, squatting, sleeping, stairs, transfers, dressing, and locomotion level   PARTICIPATION LIMITATIONS: cleaning, shopping, community activity, occupation, and yard work   PERSONAL FACTORS:  Pt is impulsive and eager to get better so that he can return to work.   are also affecting patient's functional outcome.    REHAB POTENTIAL: Good   CLINICAL DECISION MAKING: Stable/uncomplicated   EVALUATION COMPLEXITY: Low     GOALS: Goals reviewed with patient? Yes   SHORT TERM GOALS: Target date: 12/22/2022 Pt will improve L hip ROM to 10 to 90 degrees without pain in order to promote normal gait and stability.  Baseline: 20 degree Hip felx and 0 deg Ext 12/04/22: 90 deg hip flex and 5 deg EXT 12/18/22: 90 deg hip flex and 10 deg hip EXT Goal status: ACHIEVED    2.  Pt will be able to ambulate 549ft safely with Cane without fall and pain . Baseline: 30 ft with SBA and heavy reliance on [facility].   12/04/22: Pt uses SPC for primary means of mobility in home and community at this time.  Goal status: ACHIEVED       LONG TERM GOALS: Target date: 01/26/2023   Pt will Improve TUG time to <10secs without SPC to demonstrate decreased fall risk.  Baseline: 19 secs  with SPC 12/04/22: 14.6 seconds with SPC 12/18/22: 9.5 sec Goal status: ACHIEVED   2.  Pt will Improve LEFS score to >65/80 to demonstrate improved QoL Baseline: 18/80 12/04/22: 20/80  12/18/22: 22/80 01/29/23: 25/80  03/15/23: 24/80 04/09/23: 27/80  04/30/23: 26/80 05/16/23:  19/80 07/10/23: 23/80 Goal status: NOT MET   3.  Pt will be able to ambulate Independently  with LRAD without LOB and pain in order to  return to community level mobility safely  Baseline: With [facility] and FWW.   12/04/22: ModI ambulation with SPC/LRAD with mild pain at longer distances, no LOB 12/18/22: Amb IND with SPC with no LOB Goal status: ACHIEVED    4.  Pt will score >50 on FOTO Baseline: 36/56 12/04/22: 35/56 12/18/22: 37/56 01/29/23: 43/56 03/15/23: 38/56 04/09/23: 43/56 04/30/23: 41/56 05/16/23:  38/56 07/10/23: 40/56 07/19/23: 40/56 Goal status: NOT MET   5.  Pt will demonstrate 4/5 L hip strength without pain to improve stability and safety with all functional mobility in order to return to work.  Baseline: 2/5 grossly    12/04/22: 4-/5 grossly 12/18/22: 4-/5 grossly 01/29/23: 4/5 for hip flexors/quads/HS, 3+/5 for hip ABD and EXT.  03/15/23: Remaining weakness in hip flexors, quads, abductors, extensors.  04/09/23: Improved strength with pt up to at least 4/5, pain with loading hip flexors and abductors  04/30/23: Weak hip abduction and extension MMT with heightened pain; good strength otherwise 05/16/23: Weak hip flexion, abduction, extension; weak quads; pain with hip flexor and gluteal MMTs 07/10/23: Weakness primarily remaining in hip abduction only, pain with loading gluteal mm 07/19/23: Met for all with exception of hip abduction  Goal status: IN PROGRESS/MOSTLY MET         PLAN:   PT FREQUENCY: 2x/week   PT DURATION: 4 weeks   PLANNED INTERVENTIONS: Therapeutic exercises, Therapeutic activity, Neuromuscular re-education, Balance training, Gait training, Patient/Family education, Self Care, and Joint  mobilization   PLAN FOR NEXT SESSION: Continue with graded exposure to weightbearing activity, strategies for pain modulation prn. F/u pending further diagnostic workup per physician; pt still awaiting radiologist's report/physician f/u regarding recent scans.     Venetia Endo, PT, DPT [phone] Physical Therapist- [facility]  08/14/2023, 2:33 PM

## 2023-08-16 ENCOUNTER — Ambulatory Visit: Payer: Worker's Compensation | Admitting: Physical Therapy

## 2023-08-16 VITALS — BP 143/80 | HR 73

## 2023-08-16 DIAGNOSIS — M25552 Pain in left hip: Secondary | ICD-10-CM

## 2023-08-16 DIAGNOSIS — R2689 Other abnormalities of gait and mobility: Secondary | ICD-10-CM

## 2023-08-16 DIAGNOSIS — Z96642 Presence of left artificial hip joint: Secondary | ICD-10-CM | POA: Diagnosis not present

## 2023-08-16 DIAGNOSIS — R29898 Other symptoms and signs involving the musculoskeletal system: Secondary | ICD-10-CM

## 2023-08-16 DIAGNOSIS — R262 Difficulty in walking, not elsewhere classified: Secondary | ICD-10-CM

## 2023-08-16 NOTE — Therapy (Signed)
OUTPATIENT PHYSICAL THERAPY TREATMENT/GOAL UPDATE    Patient Name: Christian Hughes MRN: [id number] DOB:1960/11/10, 63 y.o., male Today's Date: 08/16/2023   END OF SESSION:   PT End of Session - 08/16/23 1442     Visit Number 58    Number of Visits 58    Authorization Type Workers Compensation    Progress Note Due on Visit 50    PT Start Time 1437    PT Stop Time 1517    PT Time Calculation (min) 40 min    Equipment Utilized During Treatment Gait belt    Activity Tolerance Patient tolerated treatment well;Patient limited by pain    Behavior During Therapy WFL for tasks assessed/performed;Impulsive              Past Medical History:  Diagnosis Date   Engages in vaping    H/O ventral hernia    S/p of repair   Hypertension Early 2010's   Marijuana use    Orthostatic dizziness 10/08/2022   Past Surgical History:  Procedure Laterality Date   HIP ARTHROPLASTY Left 10/07/2022   Procedure: ARTHROPLASTY BIPOLAR HIP (HEMIARTHROPLASTY);  Surgeon: Juanell Fairly, MD;  Location: [facility] ORS;  Service: Orthopedics;  Laterality: Left;   JOINT REPLACEMENT  10/06/2022   Left Hip Replacement due fall while at work   Ventral hernia repair     Patient Active Problem List   Diagnosis Date Noted   Asymptomatic hypertensive urgency 04/05/2023   Osteopenia determined by x-ray 02/12/2023   Hyperlipidemia 01/10/2023   Prediabetes 01/10/2023   Primary hypertension 01/10/2023   Left ventricular hypertrophy 01/10/2023   Abnormal EKG 10/06/2022   Bone lesion_sclerotic lesions in the right iliac bone 10/06/2022    PCP: Dewaine Oats MD  REFERRING PROVIDER: Juanell Fairly MD   REFERRING DIAG: [phone] (ICD-10-CM) - Presence of left artificial hip joint   THERAPY DIAG:  History of left hip hemiarthroplasty  Pain in left hip  Weakness of left leg  Balance problems  Difficulty in walking, not elsewhere classified  Rationale for Evaluation and Treatment Rehabilitation  PERTINENT  HISTORY: From initial eval: ETTA GLEAVE is a 63 y.o. male without significant past medical history except for occasional vaping amd marijuana use, who presents with fall and left hip pain.  Pt states that he slipped and fell at work at about 11:00 AM.  Denies loss of consciousness.  He injured his left hip, causing left hip pain, which is constant, sharp, severe, nonradiating, aggravated by movement.  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    "My mom took care of me after my hip surgery. I had therapy at home. I returned to my home alone after my first follow up appointment. I have pain in my buttocks, groin and the corner of my buttocks of about 2 to 3/10. I have high pain tolerance. I am unable to walk normal and I feel weak. I began to use this cane on my own after my PT at home stopped.  I can't wait to go to work because, I love to work."   PAIN:  Are you having pain? Yes: Pain location: 2-3/10 Pain description: stabbing Aggravating factors: Moving, walking, WB   Relieving factors: rest, OTC meds, ice    WEIGHT BEARING RESTRICTIONS: Yes:  WBAT   FALLS:  Has patient fallen in last 6 months? Yes. 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 Pt negotiates some small staircases in town. Pt has historically taken 24-packs of water bottles to his work for customers.    PLOF: Independent   PATIENT GOALS: "I want to return to work without hurting and falling as soon as I can."   PRECAUTIONS: Posterior hip  precautions     OBJECTIVE: (objective measures completed at initial evaluation unless otherwise dated)  PALPATION: Pain in L sacral border, Iliac crest, gluteus medius, TFL, and greater trochanter     LOWER EXTREMITY ROM:    Active ROM Right eval Left eval  Hip flexion WFL 20 deg with pain  Hip extension [facility]    Hip abduction WFL 20 with pain   Hip adduction [facility]    Hip internal rotation [facility]    Hip external rotation [facility]    Knee flexion [facility]  Knee extension [facility]  Ankle dorsiflexion [facility]  Ankle plantarflexion [facility]  Ankle inversion [facility]  Ankle eversion WFL WFL   (Blank rows = not tested)   LOWER EXTREMITY MMT:   MMT Right eval Left eval Left 04/11/23 Left 04/30/23 Left 05/16/23 Left 07/10/23 Left 07/19/23 Left 08/16/23  Hip flexion WFL 2+ 4* 4+ 4* 4+ 4+* 4*  Hip extension WFL 2 4 3+* 4-* 4* 4* 4-*  Hip abduction WFl 2 4* 3+* 3+* 4-* 4-* 4-*  Hip adduction WFl          Hip internal rotation [facility]          Hip external rotation [facility]          Knee flexion WFL 3- 5 5 4+ 5 5 4+*  Knee extension WFl 3 4+ 5 4* 5- 4* 4*  Ankle dorsiflexion WFL 3+        Ankle plantarflexion WFL 4        Ankle inversion WFL 4        Ankle eversion WFL 3-         (Blank rows = not tested) Pt's L ankle remains inverted 10 degrees since the Surgery.     FUNCTIONAL TESTS (INITIAL EVALUATION) 30 seconds chair stand test: 5 reps completed Timed up and go (TUG): 19secs 10 meter walk test: 12secs= 1,2 m/secs             Balance Test : SL standing unable, Modified tandem 10 secs, Tandem Unable, Rhomberg with EO 5 secs and unable with EC.  GAIT: Distance walked: 30 ft  Assistive device utilized: Single point cane Level of assistance: SBA Comments: Pt unsteady on feet with severe antalgic gait, decreased loading response, absent heel strike and absent toe off gait with uneven stride and decreased speed. Heavy reliance on the [facility]   -----  03/27/23  Posture: Lumbar lordosis:  WNL Iliac crest height: Equal bilaterally Lumbar lateral shift: Mild shift to R to offload LLE  AROM AROM (Normal range in degrees) AROM   Lumbar   Flexion (65) 75% (stopped at 90 deg hip flexion)  Extension (30) 50%  Right lateral flexion (25) 100%  Left lateral flexion (25) 100%  Right rotation (30) 75%  Left  rotation (30) 75%      (* = pain; Blank rows = not tested)   Sensation Grossly intact to light touch throughout bilateral LEs as determined by testing dermatomes L2-S2. Proprioception, stereognosis, and hot/cold testing deferred on this date.   Special Tests Lumbar Radiculopathy and Discogenic: Centralization and Peripheralization (SN 92, -LR 0.12): Not examined Slump (SN 83, -LR 0.32): R: Negative L: Negative SLR (SN 92, -LR 0.29): R: Negative L:  Negative  Facet Joint: Extension-Rotation (SN 100, -LR 0.0): R: Negative L: Positive  Lumbar Foraminal Stenosis: Lumbar quadrant (SN 70): R: Negative L: Positive      TODAY'S TREATMENT:                                                                                                                              DATE: 08/16/2023   L hip hemiarthroplasty following L femoral neck fracture (DOS: 10/07/22)  Next f/u with surgeon: 08/20/23   SUBJECTIVE STATEMENT:  Pt reports usual pain around 2-3/10. Patient reports no notable changes. Patient reports some increase in pain with cycling. Patient reports no major change recently in physical function or capacity for performing weightbearing activity or loading along lower limbs. He reports difficulty with getting out of vehicle multiple times during the day as needed for completing work-related tasks. Pt is still on half-days, and he feels this is his limit presently. Pt is awaiting f/u with Dr. Martha Clan to discuss his condition and recent bone scans on 08/20/23.    Today's Vitals   08/16/23 1441  BP: (!) 143/80  Pulse: 73  SpO2: 97%     There is no height or weight on file  to calculate BMI.    Therapeutic Exercise - for improved soft tissue flexibility and extensibility as needed for ROM, improved strength as needed to improve performance of CKC activities/functional movements   Nu-Step L4 for 5 minutes. Seat at 12 to maintain posterior hip flexion precautions   *GOAL UPDATE PERFORMED  Lateral step-up, 6-inch step, staircase in center of gym; 2x10  Semitandem stance on Airex; reviewed  Single leg stance; multiple attempts with up to 15 sec attained on surgical LE  -reviewed for HEP    PATIENT EDUCATION: discussed issues with making progress with outcome measure and hip strength due to persistent groin and inner thigh pain with intermittent lateral hip pain limiting performance and activity tolerance in spite of pt being well out of expected post-operative healing and osseous healing window. Discussed following up with MD regarding next steps with medical management and to further discuss potential of dry needling to address persistent pain.     *not today* Forward step up to Airex pad; 2x10 with surgical LE leading Supine hip abduction with Blue Tband, for hip abduction mobility in non-weightbearing position; 2x10  -raised Tband closer to hip for second set to reduce torque and improve tolerance Single limb bridge; 2x8 alternating R/L Toe tap with mirror feedback to avoid lateral trunk  lean with L weight shift; 2x10 alternating R/L with 6-inch step Dynamic march on blue agility ladder with focus on weight shift with upright posture; x 1.5 D/B length of ladder 3-way hip; Red Tband around ankles; x 8 ea dir, bilat LE, in // bars; Open gate, 1x D/B along blue agility ladder  -notable difficulty with successive repetitions with discomfort around L groin Hip flexor stretch, opposite foot on second step; x20, 3 sec hold, dynamic stretch; Hurdle step over 6-inch hurdle with focus on heel strike and heel to toe progression; 2 x10 ea LE Consecutive forward  hurdle step-over, heel to toe; 2x10 ea LE, over 6-inch hurdle on floor Alternating single-limb bridge; reviewed for HEP 3-way hip, standing; 1x5 ea dir, bilat Sit to stand, Airex on seat for inc seat height and Airex under feet; 2x8 Sidelying hip abduction, with 2-lb ankle weight; 2x8 Butterfly stretch; 2 x 30 sec hold  Cold pack (unbilled) - for anti-inflammatory and analgesic effect during e-stim as needed for reduced pain and improved ability to participate in active PT intervention, along L anterior in supine, x 5 minutes Forward monster walk (forward step + abduction) with Red Tband; 5x D/B length of // bars Heel walk , in // bars; x2 D/B; limited ability to maintain active dorsiflexion with stepping in bars Standing heel raise/toe raise; 2x15 alternating  Single limb stance; multiple attempts with up to 10 sec attained on each LE, in // bars Sidestep with Blue Tband superior to patellae; 4x D/B length of // bars    *not today* Lateral step up to 6-inch step; staircase in center of gym; 1x12  -pt has limited tolerance of CKC loading with vaulting off of LE on floor and lateral flexion to L when weight shifting to LLE Glute bridge with Green physioball under calves; 3x10 Manual prone quad stretch; 2 x 30 sec Prone quad stretch with bedsheet looped around ankle; x 30 sec  -for HEP demo  -pt has hamstring cramp when attempted gactive prone knee flexion hampering initiating of prone quad stretch Butt kicker along blue agility ladder; 2x D/B length of ladder for active quad/hip flexor mobility work and weight shifting Side steps along blue agility ladder: x2 D/B with Blue Tband looped above patellae Alternating arms and legs with abdominal brace with Green physioball; x20 alternating R/L Forward step-up to opposite LE tap on next step; 2x10, 6-inch step (staircase center of clinic) Alternating hip flexion with Tband, in standing today; 2x8 alternating R/L with Red Tband Forward step over  (3) 6-inch hurdles and (2) 12-inch hurdles, unsupported in open gym environment; 3x D/B with reciprocal patternProne hip extension, alternating; 2x10 alt; 2-lb ankle weights Butterfly stretch, technique reviewed for HEP Modified Thomas hip flexor stretch, technique reviewed for HEP Forward step up to Airex pad; 1x15 surgical LE leading  Prone knee bend, dynamic stretch; 1x10, 1 sec; for review Toe tapping; 2x10 alternating R/L with 12-inch step, no UE support; stance on Airex padThomas hip flexor stretch; reviewed Butterfly stretch in supine; reviewed Manual hip flexor stretch in R sidelying, gentle (hip to neutral); 2 x 30 sec Standing march with 5-lb ankle weights; 2 x 10 alternating, holding treadmill armrest Sit to stand: 1x6 and 1x4; from chair with 8-lb Goblet hold  Unipedal stance; 2 x 15 sec Bridge 3x12:  Black Tband at distal femurs with good carryover in form/technique.  Butterfly stretch; 3 x 20 sec        PATIENT EDUCATION:  Education details: see above for  patient education details Person educated: Patient Education method: Explanation and Demonstration Education comprehension: verbalized understanding   HOME EXERCISE PROGRAM: Access Code: [email or web address] URL: [email or web address] Date: 08/17/2023 Prepared by: Consuela Mimes  Exercises - Sit to Stand  - 1 x daily - 7 x weekly - 2 sets - 10 reps - Supine Bridge with Knee Extension  - 1 x daily - 7 x weekly - 2 sets - 10 reps - Sidelying Hip Abduction  - 1 x daily - 7 x weekly - 2 sets - 10 reps - Prone Hip Extension  - 1 x daily - 7 x weekly - 2 sets - 10 reps - Forward Step Over with Counter Support  - 1 x daily - 7 x weekly - 2 sets - 10 reps - Standing Romberg to 1/2 Tandem Stance  - 1 x daily - 7 x weekly - 3 sets - 30sec hold - Lateral Step Up  - 1 x daily - 7 x weekly - 2 sets - 10 reps - Standing Single Leg Stance with Counter Support  - 1 x daily - 7 x weekly - 3-4 sets - 5-10 sec hold      ASSESSMENT:   CLINICAL IMPRESSION:  Patient has unfortunately experienced persistent groin, inner thigh, and intermittently lateral hip pain following L hip hemiarthroplasty following L femoral neck fracture on 10/07/22. He has ongoing pain that limits tolerance of aerobic exercise and closed-chain work in PT. His FOTO and LEFS outcome measures have not notably changed over last 3 months. His POC has been extended longer than expected for this type of orthopedic surgery and we are beyond expected window for osseous healing at this time. Pt has undergone recent nuclear medicine bone scans that did not demonstrate fracture or hardware loosening. Pt has been able to wean from use of AD (he only uses SPC for substantial long-distance walking) and is independent with most community-level ambulation. He does have to intermittently sit to to limited volume of walking/weightbearing tolerated. Pt has ongoing weakness grossly in LLE in hip flexors, abductors, extensors, and quadriceps/hamstrings in spite of ample time spent on LE strengthening with focus on gluteal isotonics and HEP; pt is most limited by pain inhibition. Further PT POC pending f/u with surgeon. Progress has been poor over previous few months due to noted persistent pain and activity limitations. We have considered dry needling to address persistent pain, but will need approval from his surgeon.        OBJECTIVE IMPAIRMENTS: Abnormal gait, decreased activity tolerance, decreased balance, decreased endurance, decreased knowledge of condition, decreased knowledge of use of DME, decreased mobility, difficulty walking, decreased ROM, decreased strength, and dizziness.    ACTIVITY LIMITATIONS: carrying, lifting, bending, sitting, standing, squatting, sleeping, stairs, transfers, dressing, and locomotion level   PARTICIPATION LIMITATIONS: cleaning, shopping, community activity, occupation, and yard work   PERSONAL FACTORS:  Pt is impulsive and  eager to get better so that he can return to work.   are also affecting patient's functional outcome.    REHAB POTENTIAL: Good   CLINICAL DECISION MAKING: Stable/uncomplicated   EVALUATION COMPLEXITY: Low     GOALS: Goals reviewed with patient? Yes   SHORT TERM GOALS: Target date: 12/22/2022 Pt will improve L hip ROM to 10 to 90 degrees without pain in order to promote normal gait and stability.  Baseline: 20 degree Hip felx and 0 deg Ext 12/04/22: 90 deg hip flex and 5 deg EXT 12/18/22: 90 deg hip flex and 10 deg hip EXT  Goal status: ACHIEVED    2.  Pt will be able to ambulate 537ft safely with Cane without fall and pain . Baseline: 30 ft with SBA and heavy reliance on [facility].   12/04/22: Pt uses SPC for primary means of mobility in home and community at this time.  Goal status: ACHIEVED       LONG TERM GOALS: Target date: 01/26/2023   Pt will Improve TUG time to <10secs without SPC to demonstrate decreased fall risk.  Baseline: 19 secs with SPC 12/04/22: 14.6 seconds with SPC 12/18/22: 9.5 sec Goal status: ACHIEVED   2.  Pt will Improve LEFS score to >65/80 to demonstrate improved QoL Baseline: 18/80 12/04/22: 20/80  12/18/22: 22/80 01/29/23: 25/80  03/15/23: 24/80 04/09/23: 27/80  04/30/23: 26/80 05/16/23:  19/80 07/10/23: 23/80 08/16/23: 24/80  Goal status: NOT MET   3.  Pt will be able to ambulate Independently  with LRAD without LOB and pain in order to return to community level mobility safely  Baseline: With [facility] and FWW.   12/04/22: ModI ambulation with SPC/LRAD with mild pain at longer distances, no LOB 12/18/22: Amb IND with SPC with no LOB Goal status: ACHIEVED    4.  Pt will score >50 on FOTO Baseline: 36/56 12/04/22: 35/56 12/18/22: 37/56 01/29/23: 43/56 03/15/23: 38/56 04/09/23: 43/56 04/30/23: 41/56 05/16/23:  38/56 07/10/23: 40/56 07/19/23: 40/56 08/16/23: 41/56 Goal status: NOT MET   5.  Pt will demonstrate 4/5 L hip strength without pain to improve stability and  safety with all functional mobility in order to return to work.  Baseline: 2/5 grossly    12/04/22: 4-/5 grossly 12/18/22: 4-/5 grossly 01/29/23: 4/5 for hip flexors/quads/HS, 3+/5 for hip ABD and EXT.  03/15/23: Remaining weakness in hip flexors, quads, abductors, extensors.  04/09/23: Improved strength with pt up to at least 4/5, pain with loading hip flexors and abductors  04/30/23: Weak hip abduction and extension MMT with heightened pain; good strength otherwise 05/16/23: Weak hip flexion, abduction, extension; weak quads; pain with hip flexor and gluteal MMTs 07/10/23: Weakness primarily remaining in hip abduction only, pain with loading gluteal mm 07/19/23: Met for all with exception of hip abduction  08/16/23: Weakness with knee extension; hip flexion/abduction/extension  Goal status: IN PROGRESS/MOSTLY MET         PLAN:   PT FREQUENCY: 2x/week   PT DURATION: 4 weeks   PLANNED INTERVENTIONS: Therapeutic exercises, Therapeutic activity, Neuromuscular re-education, Balance training, Gait training, Patient/Family education, Self Care, and Joint mobilization   PLAN FOR NEXT SESSION: Pt to f/u with referring physician regarding his condition and persistent pain following L hip hemiarthroplasty following Fx. This is last PT f/u under current authorization.     Consuela Mimes, PT, DPT [phone] Physical Therapist- [facility]  08/16/2023, 2:42 PM

## 2023-08-17 ENCOUNTER — Encounter: Payer: Self-pay | Admitting: Physical Therapy

## 2023-08-21 ENCOUNTER — Encounter: Payer: Self-pay | Admitting: Physical Therapy

## 2023-08-23 ENCOUNTER — Other Ambulatory Visit: Payer: Self-pay | Admitting: Orthopedic Surgery

## 2023-08-23 ENCOUNTER — Encounter: Payer: Self-pay | Admitting: Physical Therapy

## 2023-08-23 DIAGNOSIS — M5416 Radiculopathy, lumbar region: Secondary | ICD-10-CM

## 2023-08-23 NOTE — Addendum Note (Signed)
Addended byBlinda Leatherwood, Shilynn Hoch H on: 08/23/2023 10:54 AM   Modules accepted: Orders

## 2023-08-29 ENCOUNTER — Ambulatory Visit
Admission: RE | Admit: 2023-08-29 | Discharge: 2023-08-29 | Disposition: A | Discharge status: Home / Self Care | Payer: Worker's Compensation | Source: Ambulatory Visit | Attending: Orthopedic Surgery | Admitting: Orthopedic Surgery

## 2023-08-29 DIAGNOSIS — M5416 Radiculopathy, lumbar region: Secondary | ICD-10-CM

## 2023-10-10 ENCOUNTER — Telehealth: Payer: Self-pay | Admitting: Physician Assistant

## 2023-10-10 ENCOUNTER — Encounter: Payer: Self-pay | Admitting: Physician Assistant

## 2023-10-10 ENCOUNTER — Ambulatory Visit (INDEPENDENT_AMBULATORY_CARE_PROVIDER_SITE_OTHER): Payer: Self-pay | Admitting: Physician Assistant

## 2023-10-10 VITALS — BP 102/64 | HR 76 | Ht 72.0 in | Wt 207.0 lb

## 2023-10-10 DIAGNOSIS — I10 Essential (primary) hypertension: Secondary | ICD-10-CM

## 2023-10-10 DIAGNOSIS — Z1211 Encounter for screening for malignant neoplasm of colon: Secondary | ICD-10-CM

## 2023-10-10 MED ORDER — CHLORTHALIDONE 25 MG PO TABS: 12.5000 mg | ORAL_TABLET | Freq: Every day | ORAL | Status: DC

## 2023-10-10 NOTE — Assessment & Plan Note (Signed)
 Normotensive in clinic x 2, borderline hypotensive.  Advised decreasing chlorthalidone dose to half a tablet as below with continued home BP monitoring.  Patient may return to full tablet if necessary.  Check CBC and CMP today.  Patient has phlebotomy induced syncope; offered Valium prescription and return later date for labs, but declined as he would rather do them while he is here.

## 2023-10-10 NOTE — Progress Notes (Signed)
 Date:  10/10/2023   Name:  Christian Hughes   DOB:  09/21/1960   MRN:  [id number]   Chief Complaint: Results (MRI results//) and Hypertension  HPI Christian Hughes presents today for 25-month follow-up on chronic conditions.  Primarily I have been treating his HTN, which was initially difficult to control, but since the addition of chlorthalidone 25 mg daily last visit, blood pressure has been well-controlled.  He was checking home BP regularly for a few months after our last visit in September, using an automatic arm cuff, but has been checking less frequently recently due to good control.  Has not had to use clonidine at all.  Denies any hypotensive symptoms.    Has not had any labs with [place] in about 9 months.  At that time he was found to have iron deficiency anemia with Hgb 11.6, iron saturation of 8, and ferritin 29.  He has been taking iron with good compliance since.  Roughly 1 year ago he had left hip hemiarthroplasty and has recently completed PT for this.    Worker's Comp. 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   Medication list has been reviewed and updated.  Current Meds  Medication Sig   atorvastatin (LIPITOR) 10 MG tablet Take 1 tablet by mouth once daily   CALCIUM PO Take by mouth.   cloNIDine (CATAPRES) 0.1 MG tablet Take 1 tablet (0.1 mg total) by mouth 3 (three) times daily as needed (For BP >170/110).   Ferrous Sulfate (IRON PO) Take by mouth daily.   lisinopril (ZESTRIL) 40 MG tablet Take 1 tablet by mouth once daily   [DISCONTINUED] chlorthalidone (HYGROTON) 25 MG tablet Take 1 tablet by mouth once daily     Review of Systems  Patient Active Problem List   Diagnosis Date Noted   Osteopenia determined by x-ray 02/12/2023   Hyperlipidemia 01/10/2023   Prediabetes 01/10/2023   Primary hypertension 01/10/2023   Left ventricular  hypertrophy 01/10/2023   Abnormal EKG 10/06/2022   Bone lesion_sclerotic lesions in the right iliac bone 10/06/2022    No Known Allergies   There is no immunization history on file for this patient.  Past Surgical History:  Procedure Laterality Date   HIP ARTHROPLASTY Left 10/07/2022   Procedure: ARTHROPLASTY BIPOLAR HIP (HEMIARTHROPLASTY);  Surgeon: Juanell Fairly, MD;  Location: [facility] ORS;  Service: Orthopedics;  Laterality: Left;   JOINT REPLACEMENT  10/06/2022   Left Hip Replacement due fall while at work   Ventral hernia repair      Social History   Tobacco Use   Smoking status: Never   Smokeless tobacco: Never   Tobacco comments:    Occasional vaping  Vaping Use   Vaping status: Never Used  Substance Use Topics   Alcohol use: Not Currently    Comment: Very seldom, special occasions.   Drug use: Not Currently    Types: Marijuana    History reviewed. No pertinent family history.      10/10/2023    3:42 PM 04/05/2023    4:08 PM 02/06/2023   11:22 AM 01/10/2023    2:06 PM  GAD 7 : Generalized Anxiety Score  Nervous, Anxious, on Edge 0 0 0 0  Control/stop worrying 0 0 0 0  Worry too much - different things 0 0 0 0  Trouble relaxing 0 0 0 0  Restless 0 0  0 0  Easily annoyed or irritable 0 0 0 0  Afraid - awful might happen 0 0 0 0  Total GAD 7 Score 0 0 0 0  Anxiety Difficulty Not difficult at all Not difficult at all Not difficult at all Not difficult at all       10/10/2023    3:42 PM 04/05/2023    4:08 PM 02/06/2023   11:22 AM  Depression screen PHQ 2/9  Decreased Interest 0 0 0  Down, Depressed, Hopeless 0 0 0  PHQ - 2 Score 0 0 0  Altered sleeping  0 0  Tired, decreased energy  0 0  Change in appetite  0 0  Feeling bad or failure about yourself   0 0  Trouble concentrating  0 0  Moving slowly or fidgety/restless  0 0  Suicidal thoughts  0 0  PHQ-9 Score  0 0  Difficult doing work/chores  Not difficult at all Not difficult at all    BP Readings  from Last 3 Encounters:  10/10/23 102/64  08/16/23 (!) 143/80  08/14/23 (!) 151/81    Wt Readings from Last 3 Encounters:  10/10/23 207 lb (93.9 kg)  04/05/23 198 lb (89.8 kg)  02/06/23 201 lb (91.2 kg)    BP 102/64 (Cuff Size: Normal)   Pulse 76   Ht 6' (1.829 m)   Wt 207 lb (93.9 kg)   SpO2 95%   BMI 28.07 kg/m   Physical Exam Vitals and nursing note reviewed.  Constitutional:      Appearance: Normal appearance.  Cardiovascular:     Rate and Rhythm: Normal rate and regular rhythm.     Heart sounds: No murmur heard.    No friction rub. No gallop.  Pulmonary:     Effort: Pulmonary effort is normal.     Breath sounds: Normal breath sounds.  Abdominal:     General: There is no distension.  Musculoskeletal:        General: Normal range of motion.  Skin:    General: Skin is warm and dry.  Neurological:     Mental Status: He is alert and oriented to person, place, and time.     Gait: Gait is intact.  Psychiatric:        Mood and Affect: Mood and affect normal.     Recent Labs     Component Value Date/Time   NA 141 01/11/2023 0914   K 4.4 01/11/2023 0914   CL 105 01/11/2023 0914   CO2 23 01/11/2023 0914   GLUCOSE 100 (H) 01/11/2023 0914   GLUCOSE 105 (H) 10/10/2022 0415   BUN 13 01/11/2023 0914   CREATININE 0.94 01/11/2023 0914   CALCIUM 9.7 01/11/2023 0914   PROT 6.7 01/11/2023 0914   ALBUMIN 4.1 01/11/2023 0914   AST 16 01/11/2023 0914   ALT 11 01/11/2023 0914   ALKPHOS 115 01/11/2023 0914   BILITOT <0.2 01/11/2023 0914   GFRNONAA >60 10/10/2022 0415    Lab Results  Component Value Date   WBC 8.8 01/11/2023   HGB 11.6 (L) 01/11/2023   HCT 36.0 (L) 01/11/2023   MCV 86 01/11/2023   PLT 333 01/11/2023   Lab Results  Component Value Date   HGBA1C 6.0 (H) 10/06/2022   Lab Results  Component Value Date   CHOL 223 (H) 10/06/2022   HDL 43 10/06/2022   LDLCALC 152 (H) 10/06/2022   TRIG 140 10/06/2022   CHOLHDL 5.2 10/06/2022   No results found  for: "TSH"   Assessment and Plan:  Primary hypertension Assessment & Plan: Normotensive in clinic x 2, borderline hypotensive.  Advised decreasing chlorthalidone dose to half a tablet as below with continued home BP monitoring.  Patient may return to full tablet if necessary.  Check CBC and CMP today.  Patient has phlebotomy induced syncope; offered Valium prescription and return later date for labs, but declined as he would rather do them while he is here.  Orders: -     CBC with Differential/Platelet -     Comprehensive metabolic panel -     Chlorthalidone; Take 0.5 tablets (12.5 mg total) by mouth daily.  Screening for colon cancer  We discussed the recent incidental finding of "polypoid focus" on MRI.  Patient denies constipation, rectal bleeding, unexplained weight loss, and overall feels pretty well.  I expressed the need for colonoscopy to evaluate, but he would like to wait until he has Medicare to have this done, which will be in roughly 4 months.  He understands that this lesion may be benign, precancerous, or cancerous.  He will inform me if there are any changes to his health status, rectal bleeding, or concerning constitutional symptoms.   Return in about 5 months (around 03/11/2024).    Alvester Morin, PA-C, DMSc, Nutritionist [facility] Primary Care and Sports Medicine [facility] [phone]

## 2023-10-10 NOTE — Telephone Encounter (Signed)
 Tried calling home number but could not leave voice mail to let patient know he has a follow up visit in 5months.

## 2023-11-04 ENCOUNTER — Other Ambulatory Visit: Payer: Self-pay | Admitting: Physician Assistant

## 2023-11-04 DIAGNOSIS — I10 Essential (primary) hypertension: Secondary | ICD-10-CM

## 2023-11-05 ENCOUNTER — Other Ambulatory Visit: Payer: Self-pay

## 2023-11-05 DIAGNOSIS — E7800 Pure hypercholesterolemia, unspecified: Secondary | ICD-10-CM

## 2023-11-05 MED ORDER — ATORVASTATIN CALCIUM 10 MG PO TABS: 10.0000 mg | ORAL_TABLET | Freq: Every day | ORAL | 0 refills | Status: DC

## 2024-01-03 ENCOUNTER — Ambulatory Visit (INDEPENDENT_AMBULATORY_CARE_PROVIDER_SITE_OTHER): Payer: Self-pay | Admitting: Family Medicine

## 2024-01-03 ENCOUNTER — Encounter: Payer: Self-pay | Admitting: Family Medicine

## 2024-01-03 ENCOUNTER — Ambulatory Visit: Payer: Self-pay

## 2024-01-03 ENCOUNTER — Encounter: Payer: Self-pay | Admitting: Physician Assistant

## 2024-01-03 VITALS — BP 134/82 | HR 90 | Ht 72.0 in | Wt 212.0 lb

## 2024-01-03 DIAGNOSIS — M25552 Pain in left hip: Secondary | ICD-10-CM | POA: Insufficient documentation

## 2024-01-03 MED ORDER — MELOXICAM 15 MG PO TABS: 15.0000 mg | ORAL_TABLET | Freq: Every day | ORAL | 0 refills | Status: AC | PRN

## 2024-01-03 MED ORDER — CYCLOBENZAPRINE HCL 10 MG PO TABS: 10.0000 mg | ORAL_TABLET | Freq: Three times a day (TID) | ORAL | 0 refills | Status: AC | PRN

## 2024-01-03 MED ORDER — PREDNISONE 20 MG PO TABS: 40.0000 mg | ORAL_TABLET | Freq: Two times a day (BID) | ORAL | 0 refills | Status: AC

## 2024-01-03 NOTE — Assessment & Plan Note (Signed)
 History of Present Illness Christian Hughes is a 63 year old male with a history of left partial hip replacement who presents with low back and hip pain following a fall sustained yesterday.  He experienced low back and hip pain after a fall that occurred yesterday, landing on the left side of his back. The pain is rated as 5 out of 10 and does not radiate.   He has a history of partial hip replacement following a fall at work in March of last year. Since the surgery, he has attended 58 physical therapy sessions and has undergone an MRI and CT scan. He was informed of low bone density and some form of arthritis during previous medical visits.  The pain is exacerbated by weather changes, and he has been experiencing a variable limp since the surgery, which has worsened since the recent fall. He did not use his cane this morning, making walking difficult, and his workplace advised him to seek medical attention.  He has been taking ibuprofen for pain management. He has not been on any other medications for pain management. No numbness or tingling in the legs, but significant low back and lateral buttock pain.  Physical Exam PALPATION: Significant tenderness at the left greater trochanteric bursa and throughout the mid distal gluteus medius on the left. Left and right SI joints are non-tender. SPECIAL TESTS: Significantly limited examination due to acuity of symptoms.  Positive piriformis and FABER tests on the left.  Equivocal to positive FADIR test.  Negative straight leg raise on the left.  Assessment and Plan Trochanteric bursitis Tenderness at left greater trochanteric bursa due to post-surgical changes and recent fall. Multiple muscle attachments contribute to soreness. Short-term aggressive treatment to manage inflammation and pain, aiming to restore function without long-term medication. - Prescribed prednisone for 7 days, AM and PM dosing. - Prescribed cyclobenzaprine as needed, cautioning  about drowsiness. - Prescribed meloxicam post-prednisone if symptoms persist. - Provided exercises for trochanteric area post-symptom improvement. - Scheduled follow-up in 2 weeks for progress assessment and potential further imaging or interventions.  Low back pain Low back pain likely from muscle spasm and post-surgical changes. No nerve impingement. Focus on reducing inflammation and muscle tension to alleviate pain and improve function. - Prescribed prednisone for inflammation control. - Prescribed cyclobenzaprine for muscle relaxation. - Advised on core and hip stabilizer exercises post-symptom improvement.  Muscle spasm Muscle spasm in deep core hip stabilizers secondary to post-surgical changes and recent fall. Symptom relief and muscle function restoration targeted. - Prescribed cyclobenzaprine to alleviate muscle spasm. - Advised on exercises to improve muscle function post-symptom improvement.  Antalgic gait Antalgic gait due to pain and muscle spasm from recent fall and post-surgical changes. Treatment addresses pain and spasm to improve gait. - Prescribed prednisone and cyclobenzaprine for pain and muscle spasm. - Advised on exercises to improve gait post-symptom improvement.

## 2024-01-03 NOTE — Progress Notes (Signed)
 Primary Care / Sports Medicine Office Visit  Patient Information:  Patient ID: Christian Hughes, male DOB: [birth year] Age: 63 y.o. MRN: [id number]   Christian Hughes is a pleasant 63 y.o. male presenting with the following:  Chief Complaint  Patient presents with   Back Pain    Had a fall yesterday, made impact on left side of back, lower back and hip pain, no xray, painful when walking, does not radiate, 5 on pain scale when walking, no medications tried, pt is walking with a cane     Vitals:   01/03/24 0958  BP: 134/82  Pulse: 90  SpO2: 97%   Vitals:   01/03/24 0958  Weight: 212 lb (96.2 kg)  Height: 6' (1.829 m)   Body mass index is 28.75 kg/m.  No results found.   Independent interpretation of notes and tests performed by another provider:   None  Procedures performed:   None  Pertinent History, Exam, Impression, and Recommendations:   Problem List Items Addressed This Visit     Greater trochanteric pain syndrome of left lower extremity - Primary   History of Present Illness Christian Hughes is a 63 year old male with a history of left partial hip replacement who presents with low back and hip pain following a fall sustained yesterday.  He experienced low back and hip pain after a fall that occurred yesterday, landing on the left side of his back. The pain is rated as 5 out of 10 and does not radiate.   He has a history of partial hip replacement following a fall at work in March of last year. Since the surgery, he has attended 28 physical therapy sessions and has undergone an MRI and CT scan. He was informed of low bone density and some form of arthritis during previous medical visits.  The pain is exacerbated by weather changes, and he has been experiencing a variable limp since the surgery, which has worsened since the recent fall. He did not use his cane this morning, making walking difficult, and his workplace advised him to seek medical attention.  He has  been taking ibuprofen for pain management. He has not been on any other medications for pain management. No numbness or tingling in the legs, but significant low back and lateral buttock pain.  Physical Exam PALPATION: Significant tenderness at the left greater trochanteric bursa and throughout the mid distal gluteus medius on the left. Left and right SI joints are non-tender. SPECIAL TESTS: Significantly limited examination due to acuity of symptoms.  Positive piriformis and FABER tests on the left.  Equivocal to positive FADIR test.  Negative straight leg raise on the left.  Assessment and Plan Trochanteric bursitis Tenderness at left greater trochanteric bursa due to post-surgical changes and recent fall. Multiple muscle attachments contribute to soreness. Short-term aggressive treatment to manage inflammation and pain, aiming to restore function without long-term medication. - Prescribed prednisone for 7 days, AM and PM dosing. - Prescribed cyclobenzaprine as needed, cautioning about drowsiness. - Prescribed meloxicam post-prednisone if symptoms persist. - Provided exercises for trochanteric area post-symptom improvement. - Scheduled follow-up in 2 weeks for progress assessment and potential further imaging or interventions.  Low back pain Low back pain likely from muscle spasm and post-surgical changes. No nerve impingement. Focus on reducing inflammation and muscle tension to alleviate pain and improve function. - Prescribed prednisone for inflammation control. - Prescribed cyclobenzaprine for muscle relaxation. - Advised on core and hip stabilizer exercises  post-symptom improvement.  Muscle spasm Muscle spasm in deep core hip stabilizers secondary to post-surgical changes and recent fall. Symptom relief and muscle function restoration targeted. - Prescribed cyclobenzaprine to alleviate muscle spasm. - Advised on exercises to improve muscle function post-symptom improvement.  Antalgic  gait Antalgic gait due to pain and muscle spasm from recent fall and post-surgical changes. Treatment addresses pain and spasm to improve gait. - Prescribed prednisone and cyclobenzaprine for pain and muscle spasm. - Advised on exercises to improve gait post-symptom improvement.        Orders & Medications Medications:  Meds ordered this encounter  Medications   predniSONE (DELTASONE) 20 MG tablet    Sig: Take 2 tablets (40 mg total) by mouth 2 (two) times daily with a meal for 7 days.    Dispense:  28 tablet    Refill:  0   cyclobenzaprine (FLEXERIL) 10 MG tablet    Sig: Take 1 tablet (10 mg total) by mouth 3 (three) times daily as needed for muscle spasms.    Dispense:  30 tablet    Refill:  0   meloxicam (MOBIC) 15 MG tablet    Sig: Take 1 tablet (15 mg total) by mouth daily as needed for pain.    Dispense:  30 tablet    Refill:  0   No orders of the defined types were placed in this encounter.    Return in about 2 weeks (around 01/17/2024).     Ma Saupe, MD, [facility]   Primary Care Sports Medicine Primary Care and Sports Medicine at [facility]

## 2024-01-03 NOTE — Telephone Encounter (Signed)
  FYI Only or Action Required?: Action required by provider  Patient was last seen in primary care on 10/10/2023 by Leopoldo Rancher, PA. Called Nurse Triage reporting Fall. Symptoms began yesterday. Low back pain. Interventions attempted: Nothing. Symptoms are: unchanged.  Triage Disposition: See HCP Within 4 Hours (Or PCP Triage)  Patient/caregiver understands and will follow disposition?: Yes         Copied from CRM [id number]. Topic: Clinical - Red Word Triage >> Jan 03, 2024  9:11 AM Lotus Round B wrote: [facility] that prompted transfer to Nurse Triage: patient fell yesterday 6/4 and is in a lot of pain today Reason for Disposition  [1] MODERATE weakness (i.e., interferes with work, school, normal activities) AND [2] new-onset or worsening  Answer Assessment - Initial Assessment Questions 1. MECHANISM: "How did the fall happen?"     Tripped 2. DOMESTIC VIOLENCE AND ELDER ABUSE SCREENING: "Did you fall because someone pushed you or tried to hurt you?" If Yes, ask: "Are you safe now?"     no 3. ONSET: "When did the fall happen?" (e.g., minutes, hours, or days ago)     Yesterday 4. LOCATION: "What part of the body hit the ground?" (e.g., back, buttocks, head, hips, knees, hands, head, stomach)     unsure 5. INJURY: "Did you hurt (injure) yourself when you fell?" If Yes, ask: "What did you injure? Tell me more about this?" (e.g., body area; type of injury; pain severity)"     Low left back 6. PAIN: "Is there any pain?" If Yes, ask: "How bad is the pain?" (e.g., Scale 1-10; or mild,  moderate, severe)   - NONE (0): No pain   - MILD (1-3): Doesn't interfere with normal activities    - MODERATE (4-7): Interferes with normal activities or awakens from sleep    - SEVERE (8-10): Excruciating pain, unable to do any normal activities      severe 7. SIZE: For cuts, bruises, or swelling, ask: "How large is it?" (e.g., inches or centimeters)      N/a 8. PREGNANCY: "Is there any chance you are  pregnant?" "When was your last menstrual period?"     N/a 9. OTHER SYMPTOMS: "Do you have any other symptoms?" (e.g., dizziness, fever, weakness; new onset or worsening).      No 10. CAUSE: "What do you think caused the fall (or falling)?" (e.g., tripped, dizzy spell)       Unsure  Protocols used: Falls and [facility]

## 2024-01-03 NOTE — Patient Instructions (Signed)
 Patient Action Plan  1. Trochanteric Bursitis Management:    - Take prednisone as prescribed for 7 days, both in the morning and evening.    - Use cyclobenzaprine as needed for pain relief, being cautious of potential drowsiness.    - If symptoms persist after prednisone, take meloxicam as advised, once daily with food on an as-needed basis.    - Begin prescribed exercises for the trochanteric area once symptoms improve.  2. Low Back Pain Management:    - Take prednisone to help control inflammation.    - Use cyclobenzaprine to relax muscles as needed.    - Start core and hip stabilizer exercises after symptoms improve.  3. Muscle Spasm Relief:    - Use cyclobenzaprine to alleviate muscle spasms.    - Begin exercises to improve muscle function once symptoms improve.  4. Follow-Up:    - Attend the scheduled follow-up appointment in 2 weeks to assess progress and discuss potential further imaging or interventions.  Red Flags: Seek medical attention if you experience new or worsening pain, significant swelling, or any unusual symptoms during treatment.

## 2024-01-03 NOTE — Telephone Encounter (Signed)
Noted   Pt has appt today.  KP

## 2024-01-17 ENCOUNTER — Ambulatory Visit: Payer: Self-pay | Admitting: Family Medicine

## 2024-02-06 ENCOUNTER — Other Ambulatory Visit: Payer: Self-pay | Admitting: Physician Assistant

## 2024-02-06 DIAGNOSIS — I10 Essential (primary) hypertension: Secondary | ICD-10-CM

## 2024-02-07 ENCOUNTER — Other Ambulatory Visit: Payer: Self-pay | Admitting: Physician Assistant

## 2024-02-07 DIAGNOSIS — E7800 Pure hypercholesterolemia, unspecified: Secondary | ICD-10-CM

## 2024-02-08 NOTE — Telephone Encounter (Signed)
 Requested medications are due for refill today.  yes  Requested medications are on the active medications list.  yes  Last refill. 11/05/2023 #90 0 rf  Future visit scheduled.   yes  Notes to clinic.  Labs are expired.     Requested Prescriptions  Pending Prescriptions Disp Refills   atorvastatin  (LIPITOR) 10 MG tablet [Pharmacy Med Name: Atorvastatin  Calcium  10 MG Oral Tablet] 90 tablet 0    Sig: Take 1 tablet by mouth once daily     Cardiovascular:  Antilipid - Statins Failed - 02/08/2024  3:17 PM      Failed - Lipid Panel in normal range within the last 12 months    Cholesterol  Date Value Ref Range Status  10/06/2022 223 (H) 0 - 200 mg/dL Final   LDL Cholesterol  Date Value Ref Range Status  10/06/2022 152 (H) 0 - 99 mg/dL Final    Comment:           Total Cholesterol/HDL:CHD Risk Coronary Heart Disease Risk Table                     Men   Women  1/2 Average Risk   3.4   3.3  Average Risk       5.0   4.4  2 X Average Risk   9.6   7.1  3 X Average Risk  23.4   11.0        Use the calculated Patient Ratio above and the CHD Risk Table to determine the patient's CHD Risk.        ATP III CLASSIFICATION (LDL):  <100     mg/dL   Optimal  899-870  mg/dL   Near or Above                    Optimal  130-159  mg/dL   Borderline  839-810  mg/dL   High  >809     mg/dL   Very High Performed at [facility], [address] [place], [place] [address]    HDL  Date Value Ref Range Status  10/06/2022 43 >40 mg/dL Final   Triglycerides  Date Value Ref Range Status  10/06/2022 140 <150 mg/dL Final         Passed - Patient is not pregnant      Passed - Valid encounter within last 12 months    Recent Outpatient Visits           1 month ago Greater trochanteric pain syndrome of left lower extremity   [place] [facility] at [facility], Selinda PARAS, MD   4 months ago Primary hypertension   [facility] Primary Care & Sports  Medicine at [facility], [place], [place]       Future Appointments             In 1 month Manya, [place] SQU[place], [place] [facility] Primary Care & Sports Medicine at [facility], [facility]

## 2024-02-08 NOTE — Telephone Encounter (Signed)
 Requested Prescriptions  Pending Prescriptions Disp Refills   lisinopril  (ZESTRIL ) 40 MG tablet [Pharmacy Med Name: Lisinopril  40 MG Oral Tablet] 90 tablet 0    Sig: Take 1 tablet by mouth once daily     Cardiovascular:  ACE Inhibitors Failed - 02/08/2024  2:19 PM      Failed - Cr in normal range and within 180 days    Creatinine, Ser  Date Value Ref Range Status  01/11/2023 0.94 0.76 - 1.27 mg/dL Final         Failed - K in normal range and within 180 days    Potassium  Date Value Ref Range Status  01/11/2023 4.4 3.5 - 5.2 mmol/L Final         Passed - Patient is not pregnant      Passed - Last BP in normal range    BP Readings from Last 1 Encounters:  01/03/24 134/82         Passed - Valid encounter within last 6 months    Recent Outpatient Visits           1 month ago Greater trochanteric pain syndrome of left lower extremity   [place] [facility] Care & Sports Medicine at [facility], Selinda PARAS, [place]   4 months ago Primary hypertension   [facility] Primary Care & Sports Medicine at [facility], [place]QU[place], [place]       Future Appointments             In 1 month Manya, [place]QU[place], [place] [facility] Primary Care & Sports Medicine at [facility], [facility]

## 2024-03-12 ENCOUNTER — Ambulatory Visit: Payer: Self-pay | Admitting: Physician Assistant

## 2024-03-17 ENCOUNTER — Ambulatory Visit (INDEPENDENT_AMBULATORY_CARE_PROVIDER_SITE_OTHER): Payer: Self-pay | Admitting: Physician Assistant

## 2024-03-17 ENCOUNTER — Encounter: Payer: Self-pay | Admitting: Physician Assistant

## 2024-03-17 VITALS — BP 112/86 | HR 80 | Temp 98.4°F | Ht 72.0 in | Wt 207.0 lb

## 2024-03-17 DIAGNOSIS — I10 Essential (primary) hypertension: Secondary | ICD-10-CM | POA: Diagnosis not present

## 2024-03-17 DIAGNOSIS — E7800 Pure hypercholesterolemia, unspecified: Secondary | ICD-10-CM

## 2024-03-17 DIAGNOSIS — Z1211 Encounter for screening for malignant neoplasm of colon: Secondary | ICD-10-CM

## 2024-03-17 DIAGNOSIS — L918 Other hypertrophic disorders of the skin: Secondary | ICD-10-CM | POA: Diagnosis not present

## 2024-03-17 MED ORDER — CHLORTHALIDONE 25 MG PO TABS
12.5000 mg | ORAL_TABLET | Freq: Every day | ORAL | 1 refills | Status: AC
Start: 2024-03-17 — End: ?

## 2024-03-17 NOTE — Progress Notes (Signed)
 Date:  03/17/2024   Name:  Christian Hughes   DOB:  01/23/1961   MRN:  [id number]   Chief Complaint: Medical Management of Chronic Issues  HPI Christian Hughes presents today for 32-month follow-up on chronic conditions.  Primarily I have been treating his HTN, which is well-controlled with chlorthalidone  12.5 mg daily.  Has not had any labs with us  in over a year.  At that time he was found to have iron deficiency anemia with Hgb 11.6, iron saturation of 8, and ferritin 29.  He has been taking iron with good compliance since.  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  Patient does not wish to proceed with colonoscopy, but would be open to noninvasive CRC screening.  He has a bothersome skin tag of the face under the right eye and would like it removed.   Medication list has been reviewed and updated.  Current Meds  Medication Sig   atorvastatin  (LIPITOR) 10 MG tablet Take 1 tablet by mouth once daily   CALCIUM  PO Take by mouth.   cyclobenzaprine  (FLEXERIL ) 10 MG tablet Take 1 tablet (10 mg total) by mouth 3 (three) times daily as needed for muscle spasms.   Ferrous Sulfate (IRON PO) Take by mouth daily.   lisinopril  (ZESTRIL ) 40 MG tablet Take 1 tablet by mouth once daily   meloxicam  (MOBIC ) 15 MG tablet Take 1 tablet (15 mg total) by mouth daily as needed for pain.   [DISCONTINUED] chlorthalidone  (HYGROTON ) 25 MG tablet Take 0.5 tablets (12.5 mg total) by mouth daily.     Review of Systems  Patient Active Problem List   Diagnosis Date Noted   Greater trochanteric pain syndrome of left lower extremity 01/03/2024   Osteopenia determined by x-ray 02/12/2023   Hyperlipidemia 01/10/2023   Prediabetes 01/10/2023   Primary hypertension 01/10/2023   Left ventricular hypertrophy 01/10/2023   Abnormal EKG 10/06/2022   Bone lesion_sclerotic lesions in the right iliac bone 10/06/2022     No Known Allergies   There is no immunization history on file for this patient.  Past Surgical History:  Procedure Laterality Date   HIP ARTHROPLASTY Left 10/07/2022   Procedure: ARTHROPLASTY BIPOLAR HIP (HEMIARTHROPLASTY);  Surgeon: Marchia Drivers, MD;  Location: [facility] ORS;  Service: Orthopedics;  Laterality: Left;   JOINT REPLACEMENT  10/06/2022   Left Hip Replacement due fall while at work   Ventral hernia repair      Social History   Tobacco Use   Smoking status: Never   Smokeless tobacco: Never   Tobacco comments:    Occasional vaping  Vaping Use   Vaping status: Never Used  Substance Use Topics   Alcohol use: Not Currently    Comment: Very seldom, special occasions.   Drug use: Not Currently    Types: Marijuana    History reviewed. No pertinent family history.      03/17/2024    3:45 PM 01/03/2024   10:05 AM 10/10/2023    3:42 PM 04/05/2023    4:08 PM  GAD 7 : Generalized Anxiety Score  Nervous, Anxious, on Edge 0 0 0 0  Control/stop worrying 0 0 0 0  Worry too much - different things 0 0 0 0  Trouble relaxing 0 0 0 0  Restless 0 0 0 0  Easily annoyed or irritable 0 0 0 0  Afraid - awful might happen 0 0  0 0  Total GAD 7 Score 0 0 0 0  Anxiety Difficulty Not difficult at all Not difficult at all Not difficult at all Not difficult at all       03/17/2024    3:45 PM 01/03/2024   10:05 AM 10/10/2023    3:42 PM  Depression screen PHQ 2/9  Decreased Interest 0 0 0  Down, Depressed, Hopeless 0 0 0  PHQ - 2 Score 0 0 0    BP Readings from Last 3 Encounters:  03/17/24 112/86  01/03/24 134/82  10/10/23 102/64    Wt Readings from Last 3 Encounters:  03/17/24 207 lb (93.9 kg)  01/03/24 212 lb (96.2 kg)  10/10/23 207 lb (93.9 kg)    BP 112/86   Pulse 80   Temp 98.4 F (36.9 C)   Ht 6' (1.829 m)   Wt 207 lb (93.9 kg)   SpO2 96%   BMI 28.07 kg/m   Physical Exam Vitals and nursing note reviewed.  Constitutional:      Appearance: Normal  appearance.  Cardiovascular:     Rate and Rhythm: Normal rate.  Pulmonary:     Effort: Pulmonary effort is normal.  Abdominal:     General: There is no distension.  Musculoskeletal:        General: Normal range of motion.  Skin:    General: Skin is warm and dry.     Comments: Skin tag at the base inferomedial to right eye (see photo)  Neurological:     Mental Status: He is alert and oriented to person, place, and time.     Gait: Gait is intact.  Psychiatric:        Mood and Affect: Mood and affect normal.      Recent Labs     Component Value Date/Time   NA 141 01/11/2023 0914   K 4.4 01/11/2023 0914   CL 105 01/11/2023 0914   CO2 23 01/11/2023 0914   GLUCOSE 100 (H) 01/11/2023 0914   GLUCOSE 105 (H) 10/10/2022 0415   BUN 13 01/11/2023 0914   CREATININE 0.94 01/11/2023 0914   CALCIUM  9.7 01/11/2023 0914   PROT 6.7 01/11/2023 0914   ALBUMIN 4.1 01/11/2023 0914   AST 16 01/11/2023 0914   ALT 11 01/11/2023 0914   ALKPHOS 115 01/11/2023 0914   BILITOT <0.2 01/11/2023 0914   GFRNONAA >60 10/10/2022 0415    Lab Results  Component Value Date   WBC 8.8 01/11/2023   HGB 11.6 (L) 01/11/2023   HCT 36.0 (L) 01/11/2023   MCV 86 01/11/2023   PLT 333 01/11/2023   Lab Results  Component Value Date   HGBA1C 6.0 (H) 10/06/2022   Lab Results  Component Value Date   CHOL 223 (H) 10/06/2022   HDL 43 10/06/2022   LDLCALC 152 (H) 10/06/2022   TRIG 140 10/06/2022   CHOLHDL 5.2 10/06/2022   No results found for: TSH    Assessment and Plan:  1. Primary hypertension (Primary) Continue chlorthalidone  as below which seems to be controlling his blood pressure well.  Patient to complete CBC and CBC ordered in March. - chlorthalidone  (HYGROTON ) 25 MG tablet; Take 0.5 tablets (12.5 mg total) by mouth daily.  Dispense: 90 tablet; Refill: 1  2. Pure hypercholesterolemia Check nonfasting lipids today - Lipid panel  3. Screening for colon cancer Discussed that colonoscopy  would be the most thorough evaluation of his incidental finding on MRI.  That said, he is unwilling to proceed with colonoscopy  at this time, so we will use Cologuard first.  He understands that if he has a positive Cologuard, he will need colonoscopy to further evaluate.   - Cologuard  4. Skin tag - Ambulatory referral to Dermatology     Return in about 6 months (around 09/17/2024) for OV f/u chronic conditions.    Rolan Hoyle, PA-C, DMSc, Nutritionist [facility] [phone]

## 2024-03-18 ENCOUNTER — Other Ambulatory Visit: Payer: Self-pay

## 2024-03-18 ENCOUNTER — Ambulatory Visit: Payer: Self-pay | Admitting: Physician Assistant

## 2024-03-18 DIAGNOSIS — E7800 Pure hypercholesterolemia, unspecified: Secondary | ICD-10-CM

## 2024-03-18 LAB — COMPREHENSIVE METABOLIC PANEL WITH GFR
ALT: 22 IU/L (ref 0–44)
AST: 28 IU/L (ref 0–40)
Albumin: 4.3 g/dL (ref 3.9–4.9)
Alkaline Phosphatase: 87 IU/L (ref 44–121)
BUN/Creatinine Ratio: 25 — ABNORMAL HIGH (ref 10–24)
BUN: 30 mg/dL — ABNORMAL HIGH (ref 8–27)
Bilirubin Total: 0.3 mg/dL (ref 0.0–1.2)
CO2: 21 mmol/L (ref 20–29)
Calcium: 9.6 mg/dL (ref 8.6–10.2)
Chloride: 99 mmol/L (ref 96–106)
Creatinine, Ser: 1.22 mg/dL (ref 0.76–1.27)
Globulin, Total: 2.6 g/dL (ref 1.5–4.5)
Glucose: 96 mg/dL (ref 70–99)
Potassium: 4.2 mmol/L (ref 3.5–5.2)
Sodium: 136 mmol/L (ref 134–144)
Total Protein: 6.9 g/dL (ref 6.0–8.5)
eGFR: 66 mL/min/1.73 (ref >59)

## 2024-03-18 LAB — CBC WITH DIFFERENTIAL/PLATELET
Basophils Absolute: 0 x10E3/uL (ref 0.0–0.2)
Basos: 0 %
EOS (ABSOLUTE): 0.2 x10E3/uL (ref 0.0–0.4)
Eos: 2 %
Hematocrit: 39.5 % (ref 37.5–51.0)
Hemoglobin: 13 g/dL (ref 13.0–17.7)
Immature Grans (Abs): 0.1 x10E3/uL (ref 0.0–0.1)
Immature Granulocytes: 1 %
Lymphocytes Absolute: 3.1 x10E3/uL (ref 0.7–3.1)
Lymphs: 29 %
MCH: 29.5 pg (ref 26.6–33.0)
MCHC: 32.9 g/dL (ref 31.5–35.7)
MCV: 90 fL (ref 79–97)
Monocytes Absolute: 1 x10E3/uL — ABNORMAL HIGH (ref 0.1–0.9)
Monocytes: 10 %
Neutrophils Absolute: 6.2 x10E3/uL (ref 1.4–7.0)
Neutrophils: 58 %
Platelets: 342 x10E3/uL (ref 150–450)
RBC: 4.41 x10E6/uL (ref 4.14–5.80)
RDW: 12.7 % (ref 11.6–15.4)
WBC: 10.6 x10E3/uL (ref 3.4–10.8)

## 2024-03-18 LAB — LIPID PANEL
Chol/HDL Ratio: 5.8 ratio — ABNORMAL HIGH (ref 0.0–5.0)
Cholesterol, Total: 204 mg/dL — ABNORMAL HIGH (ref 100–199)
HDL: 35 mg/dL — ABNORMAL LOW (ref >39)
LDL Chol Calc (NIH): 133 mg/dL — ABNORMAL HIGH (ref 0–99)
Triglycerides: 199 mg/dL — ABNORMAL HIGH (ref 0–149)
VLDL Cholesterol Cal: 36 mg/dL (ref 5–40)

## 2024-03-18 MED ORDER — ATORVASTATIN CALCIUM 20 MG PO TABS
20.0000 mg | ORAL_TABLET | Freq: Every day | ORAL | 1 refills | Status: DC
  Filled 2024-03-18: qty 90, 90d supply, fill #0

## 2024-03-24 ENCOUNTER — Other Ambulatory Visit: Payer: Self-pay

## 2024-03-24 DIAGNOSIS — E7800 Pure hypercholesterolemia, unspecified: Secondary | ICD-10-CM

## 2024-03-24 MED ORDER — ATORVASTATIN CALCIUM 20 MG PO TABS
20.0000 mg | ORAL_TABLET | Freq: Every day | ORAL | 1 refills | Status: AC
Start: 2024-03-24 — End: ?

## 2024-04-04 LAB — COLOGUARD: COLOGUARD: POSITIVE — AB

## 2024-04-07 ENCOUNTER — Other Ambulatory Visit: Payer: Self-pay | Admitting: Physician Assistant

## 2024-04-07 DIAGNOSIS — R195 Other fecal abnormalities: Secondary | ICD-10-CM

## 2024-04-07 NOTE — Telephone Encounter (Signed)
 Spoke with patient. He is in agreement with colonoscopy. Urgent GI referral submitted on account of positive Cologuard and a concerning polypoid focus of the sigmoid colon on his lumbar MRI from January.

## 2024-04-07 NOTE — Telephone Encounter (Signed)
 Please review.  KP

## 2024-04-11 ENCOUNTER — Telehealth: Payer: Self-pay

## 2024-04-11 NOTE — Telephone Encounter (Signed)
 In the referral for the patient her request to be seen at [facility]. It looks like the referral was sent to [facility]. Pt has not been schedule for urgent colonoscopy.  KP

## 2024-04-11 NOTE — Telephone Encounter (Signed)
 FYI  KP

## 2024-05-05 ENCOUNTER — Other Ambulatory Visit: Payer: Self-pay | Admitting: Physician Assistant

## 2024-05-05 DIAGNOSIS — I10 Essential (primary) hypertension: Secondary | ICD-10-CM

## 2024-05-06 NOTE — Telephone Encounter (Signed)
 Requested Prescriptions  Pending Prescriptions Disp Refills   lisinopril  (ZESTRIL ) 40 MG tablet [Pharmacy Med Name: Lisinopril  40 MG Oral Tablet] 90 tablet 1    Sig: Take 1 tablet by mouth once daily     Cardiovascular:  ACE Inhibitors Passed - 05/06/2024  3:25 PM      Passed - Cr in normal range and within 180 days    Creatinine, Ser  Date Value Ref Range Status  03/17/2024 1.22 0.76 - 1.27 mg/dL Final         Passed - K in normal range and within 180 days    Potassium  Date Value Ref Range Status  03/17/2024 4.2 3.5 - 5.2 mmol/L Final         Passed - Patient is not pregnant      Passed - Last BP in normal range    BP Readings from Last 1 Encounters:  03/17/24 112/86         Passed - Valid encounter within last 6 months    Recent Outpatient Visits           1 month ago Primary hypertension   [place] [facility] at [facility], Toribio SQUIBB, [place]   4 months ago Greater trochanteric pain syndrome of left lower extremity   [place] [facility] at [facility], [place] PARAS, [place]   6 months ago Primary hypertension   [facility] at [facility], [place] SQU[place], [place]

## 2024-06-13 ENCOUNTER — Ambulatory Visit: Payer: Self-pay

## 2024-06-13 DIAGNOSIS — D123 Benign neoplasm of transverse colon: Secondary | ICD-10-CM | POA: Diagnosis not present

## 2024-06-13 DIAGNOSIS — R195 Other fecal abnormalities: Secondary | ICD-10-CM | POA: Diagnosis not present

## 2024-06-13 DIAGNOSIS — K5730 Diverticulosis of large intestine without perforation or abscess without bleeding: Secondary | ICD-10-CM | POA: Diagnosis not present

## 2024-06-13 DIAGNOSIS — Z1211 Encounter for screening for malignant neoplasm of colon: Secondary | ICD-10-CM | POA: Diagnosis not present

## 2024-06-13 DIAGNOSIS — K635 Polyp of colon: Secondary | ICD-10-CM | POA: Diagnosis not present

## 2024-06-13 DIAGNOSIS — D374 Neoplasm of uncertain behavior of colon: Secondary | ICD-10-CM | POA: Diagnosis not present

## 2024-06-13 DIAGNOSIS — K640 First degree hemorrhoids: Secondary | ICD-10-CM

## 2024-09-17 ENCOUNTER — Ambulatory Visit: Admitting: Physician Assistant
